# Patient Record
Sex: Female | Born: 1945 | Race: White | Hispanic: No | Marital: Married | Smoking: Former smoker
Health system: Southern US, Community
[De-identification: ages and names within clinical notes are randomized; demographics above are authoritative.]

## PROBLEM LIST (undated history)

## (undated) DIAGNOSIS — R519 Headache, unspecified: Secondary | ICD-10-CM

## (undated) DIAGNOSIS — G4730 Sleep apnea, unspecified: Secondary | ICD-10-CM

## (undated) DIAGNOSIS — N189 Chronic kidney disease, unspecified: Secondary | ICD-10-CM

## (undated) DIAGNOSIS — I10 Essential (primary) hypertension: Secondary | ICD-10-CM

## (undated) DIAGNOSIS — G2571 Drug induced akathisia: Secondary | ICD-10-CM

## (undated) DIAGNOSIS — R0600 Dyspnea, unspecified: Secondary | ICD-10-CM

## (undated) DIAGNOSIS — M1990 Unspecified osteoarthritis, unspecified site: Secondary | ICD-10-CM

## (undated) DIAGNOSIS — G709 Myoneural disorder, unspecified: Secondary | ICD-10-CM

## (undated) DIAGNOSIS — K219 Gastro-esophageal reflux disease without esophagitis: Secondary | ICD-10-CM

## (undated) DIAGNOSIS — F329 Major depressive disorder, single episode, unspecified: Secondary | ICD-10-CM

## (undated) DIAGNOSIS — G243 Spasmodic torticollis: Secondary | ICD-10-CM

## (undated) DIAGNOSIS — K529 Noninfective gastroenteritis and colitis, unspecified: Secondary | ICD-10-CM

## (undated) DIAGNOSIS — F0390 Unspecified dementia without behavioral disturbance: Secondary | ICD-10-CM

## (undated) DIAGNOSIS — F419 Anxiety disorder, unspecified: Secondary | ICD-10-CM

## (undated) DIAGNOSIS — F32A Depression, unspecified: Secondary | ICD-10-CM

## (undated) HISTORY — PX: CHOLECYSTECTOMY: SHX55

## (undated) HISTORY — PX: TONSILLECTOMY: SUR1361

## (undated) HISTORY — PX: KNEE SURGERY: SHX244

## (undated) HISTORY — DX: Essential (primary) hypertension: I10

## (undated) HISTORY — DX: Unspecified osteoarthritis, unspecified site: M19.90

## (undated) HISTORY — DX: Noninfective gastroenteritis and colitis, unspecified: K52.9

## (undated) HISTORY — DX: Spasmodic torticollis: G24.3

## (undated) HISTORY — PX: ABDOMINAL HYSTERECTOMY: SHX81

## (undated) HISTORY — PX: APPENDECTOMY: SHX54

---
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
## 1999-02-06 ENCOUNTER — Other Ambulatory Visit: Admission: RE | Admit: 1999-02-06 | Discharge: 1999-02-06 | Payer: Self-pay | Admitting: *Deleted

## 1999-07-11 ENCOUNTER — Encounter: Payer: Self-pay | Admitting: Gastroenterology

## 1999-07-11 ENCOUNTER — Inpatient Hospital Stay (HOSPITAL_COMMUNITY): Admission: EM | Admit: 1999-07-11 | Discharge: 1999-07-14 | Payer: Self-pay | Admitting: Emergency Medicine

## 1999-07-11 ENCOUNTER — Encounter (INDEPENDENT_AMBULATORY_CARE_PROVIDER_SITE_OTHER): Payer: Self-pay | Admitting: Specialist

## 2000-08-06 ENCOUNTER — Other Ambulatory Visit: Admission: RE | Admit: 2000-08-06 | Discharge: 2000-08-06 | Payer: Self-pay | Admitting: *Deleted

## 2000-08-07 ENCOUNTER — Encounter: Payer: Self-pay | Admitting: *Deleted

## 2000-08-07 ENCOUNTER — Encounter: Admission: RE | Admit: 2000-08-07 | Discharge: 2000-08-07 | Payer: Self-pay | Admitting: *Deleted

## 2000-09-04 ENCOUNTER — Encounter: Admission: RE | Admit: 2000-09-04 | Discharge: 2000-09-04 | Payer: Self-pay | Admitting: Family Medicine

## 2000-09-04 ENCOUNTER — Encounter: Payer: Self-pay | Admitting: Family Medicine

## 2001-02-24 ENCOUNTER — Ambulatory Visit (HOSPITAL_COMMUNITY): Admission: RE | Admit: 2001-02-24 | Discharge: 2001-02-24 | Payer: Self-pay | Admitting: Gastroenterology

## 2001-08-08 ENCOUNTER — Encounter: Payer: Self-pay | Admitting: *Deleted

## 2001-08-08 ENCOUNTER — Encounter: Admission: RE | Admit: 2001-08-08 | Discharge: 2001-08-08 | Payer: Self-pay | Admitting: *Deleted

## 2001-08-12 ENCOUNTER — Other Ambulatory Visit: Admission: RE | Admit: 2001-08-12 | Discharge: 2001-08-12 | Payer: Self-pay | Admitting: *Deleted

## 2001-09-01 ENCOUNTER — Encounter: Admission: RE | Admit: 2001-09-01 | Discharge: 2001-09-01 | Payer: Self-pay | Admitting: Family Medicine

## 2001-09-01 ENCOUNTER — Encounter: Payer: Self-pay | Admitting: Family Medicine

## 2002-06-08 ENCOUNTER — Ambulatory Visit (HOSPITAL_COMMUNITY): Admission: RE | Admit: 2002-06-08 | Discharge: 2002-06-08 | Payer: Self-pay | Admitting: Gastroenterology

## 2002-08-25 ENCOUNTER — Encounter: Payer: Self-pay | Admitting: Family Medicine

## 2002-08-25 ENCOUNTER — Encounter: Admission: RE | Admit: 2002-08-25 | Discharge: 2002-08-25 | Payer: Self-pay | Admitting: Family Medicine

## 2002-08-28 ENCOUNTER — Encounter: Admission: RE | Admit: 2002-08-28 | Discharge: 2002-08-28 | Payer: Self-pay | Admitting: Obstetrics and Gynecology

## 2002-08-28 ENCOUNTER — Encounter: Payer: Self-pay | Admitting: Obstetrics and Gynecology

## 2004-05-09 ENCOUNTER — Ambulatory Visit (HOSPITAL_COMMUNITY): Admission: RE | Admit: 2004-05-09 | Discharge: 2004-05-09 | Payer: Self-pay | Admitting: Orthopedic Surgery

## 2004-05-09 ENCOUNTER — Ambulatory Visit (HOSPITAL_BASED_OUTPATIENT_CLINIC_OR_DEPARTMENT_OTHER): Admission: RE | Admit: 2004-05-09 | Discharge: 2004-05-09 | Payer: Self-pay | Admitting: Orthopedic Surgery

## 2004-10-11 ENCOUNTER — Other Ambulatory Visit: Admission: RE | Admit: 2004-10-11 | Discharge: 2004-10-11 | Payer: Self-pay | Admitting: Obstetrics and Gynecology

## 2004-10-18 ENCOUNTER — Encounter: Admission: RE | Admit: 2004-10-18 | Discharge: 2004-10-18 | Payer: Self-pay | Admitting: Obstetrics and Gynecology

## 2006-02-01 ENCOUNTER — Encounter: Admission: RE | Admit: 2006-02-01 | Discharge: 2006-02-01 | Payer: Self-pay | Admitting: Cardiology

## 2006-05-16 ENCOUNTER — Encounter: Admission: RE | Admit: 2006-05-16 | Discharge: 2006-05-16 | Payer: Self-pay | Admitting: Family Medicine

## 2006-12-30 ENCOUNTER — Encounter: Admission: RE | Admit: 2006-12-30 | Discharge: 2006-12-30 | Payer: Self-pay | Admitting: Family Medicine

## 2007-01-03 ENCOUNTER — Encounter: Admission: RE | Admit: 2007-01-03 | Discharge: 2007-01-03 | Payer: Self-pay | Admitting: Family Medicine

## 2007-09-08 ENCOUNTER — Encounter
Admission: RE | Admit: 2007-09-08 | Discharge: 2007-12-07 | Payer: Self-pay | Admitting: Physical Medicine & Rehabilitation

## 2007-09-09 ENCOUNTER — Ambulatory Visit: Payer: Self-pay | Admitting: Physical Medicine & Rehabilitation

## 2007-09-17 ENCOUNTER — Encounter
Admission: RE | Admit: 2007-09-17 | Discharge: 2007-10-17 | Payer: Self-pay | Admitting: Physical Medicine & Rehabilitation

## 2007-10-13 ENCOUNTER — Ambulatory Visit: Payer: Self-pay | Admitting: Physical Medicine & Rehabilitation

## 2007-11-10 ENCOUNTER — Ambulatory Visit: Payer: Self-pay | Admitting: Physical Medicine & Rehabilitation

## 2007-12-23 ENCOUNTER — Ambulatory Visit: Payer: Self-pay | Admitting: Physical Medicine & Rehabilitation

## 2007-12-23 ENCOUNTER — Encounter
Admission: RE | Admit: 2007-12-23 | Discharge: 2008-03-22 | Payer: Self-pay | Admitting: Physical Medicine & Rehabilitation

## 2008-03-01 ENCOUNTER — Encounter
Admission: RE | Admit: 2008-03-01 | Discharge: 2008-05-30 | Payer: Self-pay | Admitting: Physical Medicine & Rehabilitation

## 2008-03-01 ENCOUNTER — Ambulatory Visit: Payer: Self-pay | Admitting: Physical Medicine & Rehabilitation

## 2008-03-25 ENCOUNTER — Ambulatory Visit: Payer: Self-pay | Admitting: Physical Medicine & Rehabilitation

## 2008-03-30 ENCOUNTER — Ambulatory Visit: Payer: Self-pay | Admitting: Physical Medicine & Rehabilitation

## 2008-04-27 ENCOUNTER — Ambulatory Visit: Payer: Self-pay | Admitting: Physical Medicine & Rehabilitation

## 2008-05-27 ENCOUNTER — Encounter
Admission: RE | Admit: 2008-05-27 | Discharge: 2008-08-25 | Payer: Self-pay | Admitting: Physical Medicine & Rehabilitation

## 2008-05-28 ENCOUNTER — Ambulatory Visit: Payer: Self-pay | Admitting: Physical Medicine & Rehabilitation

## 2008-06-25 ENCOUNTER — Ambulatory Visit: Payer: Self-pay | Admitting: Physical Medicine & Rehabilitation

## 2008-08-10 ENCOUNTER — Ambulatory Visit: Payer: Self-pay | Admitting: Physical Medicine & Rehabilitation

## 2008-09-06 ENCOUNTER — Encounter
Admission: RE | Admit: 2008-09-06 | Discharge: 2008-12-05 | Payer: Self-pay | Admitting: Physical Medicine & Rehabilitation

## 2008-09-07 ENCOUNTER — Ambulatory Visit: Payer: Self-pay | Admitting: Physical Medicine & Rehabilitation

## 2008-11-02 ENCOUNTER — Ambulatory Visit: Payer: Self-pay | Admitting: Physical Medicine & Rehabilitation

## 2008-11-09 ENCOUNTER — Ambulatory Visit: Payer: Self-pay | Admitting: Physical Medicine & Rehabilitation

## 2008-12-03 ENCOUNTER — Encounter: Admission: RE | Admit: 2008-12-03 | Discharge: 2008-12-03 | Payer: Self-pay | Admitting: Family Medicine

## 2008-12-07 ENCOUNTER — Encounter
Admission: RE | Admit: 2008-12-07 | Discharge: 2009-03-07 | Payer: Self-pay | Admitting: Physical Medicine & Rehabilitation

## 2008-12-07 ENCOUNTER — Ambulatory Visit: Payer: Self-pay | Admitting: Physical Medicine & Rehabilitation

## 2008-12-30 ENCOUNTER — Ambulatory Visit: Payer: Self-pay | Admitting: *Deleted

## 2009-02-07 ENCOUNTER — Ambulatory Visit: Payer: Self-pay | Admitting: Physical Medicine & Rehabilitation

## 2009-04-27 ENCOUNTER — Encounter
Admission: RE | Admit: 2009-04-27 | Discharge: 2009-05-02 | Payer: Self-pay | Admitting: Physical Medicine & Rehabilitation

## 2009-05-02 ENCOUNTER — Ambulatory Visit: Payer: Self-pay | Admitting: Physical Medicine & Rehabilitation

## 2009-06-30 ENCOUNTER — Ambulatory Visit: Payer: Self-pay | Admitting: *Deleted

## 2009-07-29 ENCOUNTER — Encounter
Admission: RE | Admit: 2009-07-29 | Discharge: 2009-08-04 | Payer: Self-pay | Admitting: Physical Medicine & Rehabilitation

## 2009-08-04 ENCOUNTER — Ambulatory Visit: Payer: Self-pay | Admitting: Physical Medicine & Rehabilitation

## 2009-11-03 ENCOUNTER — Encounter
Admission: RE | Admit: 2009-11-03 | Discharge: 2009-12-14 | Payer: Self-pay | Admitting: Physical Medicine & Rehabilitation

## 2009-11-04 ENCOUNTER — Ambulatory Visit: Payer: Self-pay | Admitting: Physical Medicine & Rehabilitation

## 2010-01-03 ENCOUNTER — Encounter: Admission: RE | Admit: 2010-01-03 | Discharge: 2010-01-03 | Payer: Self-pay | Admitting: Family Medicine

## 2010-01-19 ENCOUNTER — Encounter
Admission: RE | Admit: 2010-01-19 | Discharge: 2010-01-26 | Payer: Self-pay | Admitting: Physical Medicine & Rehabilitation

## 2010-01-26 ENCOUNTER — Ambulatory Visit: Payer: Self-pay | Admitting: Physical Medicine & Rehabilitation

## 2010-04-19 ENCOUNTER — Encounter
Admission: RE | Admit: 2010-04-19 | Discharge: 2010-04-21 | Payer: Self-pay | Admitting: Physical Medicine & Rehabilitation

## 2010-04-21 ENCOUNTER — Ambulatory Visit: Payer: Self-pay | Admitting: Physical Medicine & Rehabilitation

## 2010-07-18 ENCOUNTER — Encounter
Admission: RE | Admit: 2010-07-18 | Discharge: 2010-07-21 | Payer: Self-pay | Admitting: Physical Medicine & Rehabilitation

## 2010-07-21 ENCOUNTER — Ambulatory Visit: Payer: Self-pay | Admitting: Physical Medicine & Rehabilitation

## 2010-08-04 ENCOUNTER — Ambulatory Visit: Payer: Self-pay | Admitting: Vascular Surgery

## 2010-09-13 ENCOUNTER — Encounter (HOSPITAL_COMMUNITY)
Admission: RE | Admit: 2010-09-13 | Discharge: 2010-09-22 | Payer: Self-pay | Source: Home / Self Care | Admitting: Cardiology

## 2010-09-20 ENCOUNTER — Encounter: Admission: RE | Admit: 2010-09-20 | Discharge: 2010-09-20 | Payer: Self-pay | Admitting: Neurology

## 2010-09-30 ENCOUNTER — Inpatient Hospital Stay (HOSPITAL_COMMUNITY)
Admission: EM | Admit: 2010-09-30 | Discharge: 2010-10-06 | Payer: Self-pay | Source: Home / Self Care | Admitting: Emergency Medicine

## 2010-09-30 ENCOUNTER — Ambulatory Visit: Payer: Self-pay | Admitting: Pulmonary Disease

## 2010-10-23 ENCOUNTER — Encounter
Admission: RE | Admit: 2010-10-23 | Discharge: 2010-10-27 | Payer: Self-pay | Source: Home / Self Care | Attending: Physical Medicine & Rehabilitation | Admitting: Physical Medicine & Rehabilitation

## 2010-10-27 ENCOUNTER — Ambulatory Visit: Payer: Self-pay | Admitting: Physical Medicine & Rehabilitation

## 2010-11-15 ENCOUNTER — Encounter: Admission: RE | Admit: 2010-11-15 | Discharge: 2010-11-15 | Payer: Self-pay | Admitting: Specialist

## 2010-12-17 HISTORY — PX: JOINT REPLACEMENT: SHX530

## 2011-01-06 ENCOUNTER — Encounter: Payer: Self-pay | Admitting: Neurology

## 2011-01-08 NOTE — H&P (Signed)
[name], [name]            ACCOUNT NO.:  [id number]  MEDICAL RECORD NO.:  [id number]          PATIENT TYPE:  INP  LOCATION:  NA                           FACILITY:  [facility]  PHYSICIAN:  Madlyn Frankel. Charlann Boxer, M.D.  DATE OF BIRTH:  09/26/1946  DATE OF ADMISSION: DATE OF DISCHARGE:                             HISTORY & PHYSICAL   HISTORY:  This is a 65 year old female who is seen at request Dr. Thomasena Edis' acute care clinic for acute onset of right knee pain.  She states the pain was quite severe limiting her activity.  She is also having locking up sensation and after injection with Dr. Thomasena Edis, it did not provide any relief, it was determined radiographically and through exam by Dr. Charlann Boxer to go and schedule the patient for a knee arthroplasty.  PAST MEDICAL HISTORY:  Significant for some migraines prior to 2005, anxiety, depression, elevated cholesterol, sleep apnea, hypertension, hiatal hernia reflux disease, gallbladder disease, some insomnia, and balance problems.  She has cervical dystonia, shortness of breath on exertion, joint pain and spasm.  CURRENT MEDICATION: 1. Osteo Bi-Flex 2 a day. 2. Lipitor 20 mg a day. 3. Azor 5/20 once a day. 4. Clonazepam 2 mg a day. 5. Hydrocodone through pain clinic 5/500 two times a day.  ALLERGIES:  She has medicine allergies to SULFA drugs.  SOCIAL HISTORY:  She is married.  She has a past history of tobacco.  No history of alcohol or street drugs.  She has 3 children, 2 biological, 1 adopted.  FAMILY HISTORY:  Her father is deceased of MI.  Mother is 76 with Alzheimer's.  She has 2 siblings.  REVIEW OF SYSTEMS:  Notable for those difficulties described in the history of present illness, past medical history.  Her review of systems sheet is otherwise unremarkable.  PHYSICAL EXAMINATION:  VITAL SIGNS:  The patient is 5 feet 4 inches, weight 167 pounds.  Blood pressure is 120/90, respirations are 20, pulse is 80.  GENERAL:  General  health is good.  She has history of insomnia. HEENT:  Shows her to be normocephalic.  She wears prescription glasses. NECK:  Soft without any bruits.  She has cervical dystonia. CHEST:  Clear to auscultation bilaterally.  She has sleep apnea and shortness of breath on exertion. HEART:  S1, S2.  There are no murmurs, rubs or gallops.  She has history of hypercholesterolemia and hypertension. ABDOMEN:  Soft, nondistended with a history of gallbladder, hiatal hernia, and GERD. GI/GU:  Otherwise unremarkable. EXTREMITIES:  Joint pain, osteoarthritis. DERMATOLOGIC:  Dermatologically she is intact. NEUROLOGIC:  Neurologically she has history of anxiety, depression.  LABORATORY DATA:  Her labs, EKG and chest x-ray are pending through [facility].  IMPRESSION:  Right knee osteoarthritis.  PLAN:  She will be admitted on January 15, 2011, for total right knee arthroplasty with Dr. Charlann Boxer.  Her discharge medications including Xarelto, Robaxin, iron, MiraLax and Colace were given to her today.  Her pain medicines will be given to her on discharge.    Russell L. Webb Silversmith, RN   ______________________________ Madlyn Frankel Charlann Boxer, M.D.    RLW/MEDQ  D:  01/04/2011  T:  01/04/2011  Job:  [id number]  Electronically Signed by Lauree Chandler NP-C on 01/05/2011 09:47:41 AM Electronically Signed by Durene Romans M.D. on 01/08/2011 09:36:07 AM

## 2011-01-09 LAB — URINALYSIS, ROUTINE W REFLEX MICROSCOPIC
Ketones, ur: NEGATIVE mg/dL
Protein, ur: NEGATIVE mg/dL
Urine Glucose, Fasting: NEGATIVE mg/dL
Urobilinogen, UA: 0.2 mg/dL (ref 0.0–1.0)

## 2011-01-09 LAB — SURGICAL PCR SCREEN
MRSA, PCR: NEGATIVE
Staphylococcus aureus: NEGATIVE

## 2011-01-09 LAB — CBC
MCH: 28.2 pg (ref 26.0–34.0)
MCV: 85.2 fL (ref 78.0–100.0)
Platelets: 368 10*3/uL (ref 150–400)
RDW: 13.4 % (ref 11.5–15.5)

## 2011-01-09 LAB — DIFFERENTIAL
Eosinophils Absolute: 0.4 10*3/uL (ref 0.0–0.7)
Eosinophils Relative: 4 % (ref 0–5)
Lymphs Abs: 3.9 10*3/uL (ref 0.7–4.0)
Monocytes Relative: 10 % (ref 3–12)

## 2011-01-09 LAB — BASIC METABOLIC PANEL
BUN: 12 mg/dL (ref 6–23)
Chloride: 107 mEq/L (ref 96–112)
Potassium: 4.7 mEq/L (ref 3.5–5.1)
Sodium: 144 mEq/L (ref 135–145)

## 2011-01-15 ENCOUNTER — Inpatient Hospital Stay (HOSPITAL_COMMUNITY)
Admission: RE | Admit: 2011-01-15 | Discharge: 2011-01-17 | DRG: 470 | Disposition: A | Discharge status: Home Health | Payer: 59 | Attending: Orthopedic Surgery | Admitting: Orthopedic Surgery

## 2011-01-15 DIAGNOSIS — E669 Obesity, unspecified: Secondary | ICD-10-CM | POA: Diagnosis present

## 2011-01-15 DIAGNOSIS — Z9119 Patient's noncompliance with other medical treatment and regimen: Secondary | ICD-10-CM

## 2011-01-15 DIAGNOSIS — G4733 Obstructive sleep apnea (adult) (pediatric): Secondary | ICD-10-CM | POA: Diagnosis present

## 2011-01-15 DIAGNOSIS — K219 Gastro-esophageal reflux disease without esophagitis: Secondary | ICD-10-CM | POA: Diagnosis present

## 2011-01-15 DIAGNOSIS — Z91199 Patient's noncompliance with other medical treatment and regimen due to unspecified reason: Secondary | ICD-10-CM

## 2011-01-15 DIAGNOSIS — M1710 Unilateral primary osteoarthritis, unspecified knee: Principal | ICD-10-CM | POA: Diagnosis present

## 2011-01-15 DIAGNOSIS — I10 Essential (primary) hypertension: Secondary | ICD-10-CM | POA: Diagnosis present

## 2011-01-15 LAB — TYPE AND SCREEN
ABO/RH(D): O POS
Antibody Screen: NEGATIVE

## 2011-01-16 LAB — BASIC METABOLIC PANEL
BUN: 13 mg/dL (ref 6–23)
Calcium: 8.8 mg/dL (ref 8.4–10.5)
Creatinine, Ser: 1.07 mg/dL (ref 0.4–1.2)
GFR calc Af Amer: >60 mL/min (ref >60)
GFR calc non Af Amer: 52 mL/min — ABNORMAL LOW (ref >60)

## 2011-01-16 LAB — CBC
MCH: 28.8 pg (ref 26.0–34.0)
MCHC: 33.7 g/dL (ref 30.0–36.0)
Platelets: 229 10*3/uL (ref 150–400)
RBC: 3.89 MIL/uL (ref 3.87–5.11)
RDW: 13.5 % (ref 11.5–15.5)

## 2011-01-17 LAB — CBC
Platelets: 204 10*3/uL (ref 150–400)
RBC: 3.54 MIL/uL — ABNORMAL LOW (ref 3.87–5.11)
RDW: 13.2 % (ref 11.5–15.5)
WBC: 7.2 10*3/uL (ref 4.0–10.5)

## 2011-01-17 LAB — BASIC METABOLIC PANEL
Chloride: 106 mEq/L (ref 96–112)
GFR calc Af Amer: 60 mL/min — ABNORMAL LOW (ref >60)
GFR calc non Af Amer: 49 mL/min — ABNORMAL LOW (ref >60)
Potassium: 4.4 mEq/L (ref 3.5–5.1)

## 2011-01-18 NOTE — Op Note (Signed)
[name], [name]            ACCOUNT NO.:  [id number]  MEDICAL RECORD NO.:  [id number]          PATIENT TYPE:  INP  LOCATION:  0011                         FACILITY:  [facility]  PHYSICIAN:  Madlyn Frankel. Charlann Boxer, M.D.  DATE OF BIRTH:  [birth year]  DATE OF PROCEDURE:  01/15/2011 DATE OF DISCHARGE:                              OPERATIVE REPORT   PREOPERATIVE DIAGNOSIS:  Right knee osteoarthritis.  POSTOPERATIVE DIAGNOSIS:  Right knee osteoarthritis.  PROCEDURE:  Right total knee replacement utilizing DePuy component size 2.5 femur, 2.5 tibia, 12.5 rotating platform posterior stabilized insert and 38 patellar button.  SURGEON:  Madlyn Frankel. Charlann Boxer, M.D.  ASSISTANT:  Nelia Shi. Webb Silversmith, RN  ANESTHESIA:  Spinal.  SPECIMENS:  None.  COMPLICATIONS:  None.  DRAINS:  One Hemovac.  TOURNIQUET TIME:  34 minutes, 250 mmHg.  INDICATION FOR THE PROCEDURE:  [name] is a 65-old-female, who presented for evaluation of right knee pain.  She had mechanical type symptoms with radiographic evidence supporting advanced patellofemoral arthritis.  She had presented to the office with previous MRI evaluation, which supported this.  Her radiographs were indicative of her current situation.  She had failed conservative measures attempts at some strengthening and injections and with this wished to proceed more definitive measures.  Risks of infection, DVT, component failure, need for revision surgery all were discussed and reviewed.  Postoperative course and expectations were reviewed.  Consent was obtained for benefit of pain relief.  PROCEDURE IN DETAIL:  Patient was brought to the operative theater. Once adequate anesthesia, preoperative antibiotics, Ancef administered, patient was positioned supine with the right thigh tourniquet placed. The right lower extremity was then prepped and draped in a sterile fashion with the right leg placed in the Mayo leg holder.  A time-out was performed  identifying the patient, planned procedure and the extremity.  Leg was exsanguinated and tourniquet elevated to 250 mmHg.  A midline incision was made followed by median parapatellar arthrotomy. Following initial exposure and debridement, attention was directed to patella.  Precut measurement was lowered by 18 mm, I resected down to about 13 mm and used a 38 patellar button to restore patellar height and protect and cover the cut surface.  Lug holes were drilled and a metal shim was placed to protect the patella from retractors and saw blades.  At this point, attention was directed to the femur.  Femoral canal was opened with a drill, irrigated throughout from the fat emboli.  An intramedullary rod was passed in 30 degrees of valgus, 9 mm of the bone was resected off the distal femur based on hyperextension and preoperative exam.  At this point, the tibia was subluxated anteriorly.  The extramedullary guide was positioned and 10 mm of bone was resected off the proximal lateral tibia.  Following resection of the meniscus and cruciate stumps and removal of the bone, I checked to confirm the cup was perpendicular in the coronal plane in addition to making sure the extension gap was adequate with a 10-mm block.  Once this was confirmed, we sized the femur to be a size 2.5.  The rotation block was pinned into position anterior  reference based off the proximal tibial cut.  Four in one cutting block with a 2.5 tibia was placed.  The four anterior and posterior chamfer cuts were then made without difficulty or complication.  There was slight notch anterior and this was beveled out.  At this point, the final box cut was made off the lateral aspect of distal femur.  The tibia was then subluxed anteriorly, cut surface was best fit with size 2.5 tibial tray, which was pinned into position through the medial third tubercle, drilled and keel punched.  Trial reduction was carried out to 2.5 femur,  2.5 tibia and the 10 mm insert. Initially, there was about a few degrees of hyperextension to side one with 12.5 insert.  At this point, the trial components were removed.  Sclerotic bone was drilled with a smooth pin.  The knee was irrigated with normal saline solution and synovial capsule junction injected with 0.25% Marcaine with epinephrine and morphine, total of 51 mL.  Final components were opened and cement mixed.  Final components were then cemented onto clean and dried cut surface of bone with a 12.5 insert placed.  The knee was brought to extension to allow for cement to cure.  Extruded cement was removed.  Following this resection and the curing of the cement, excessive cement was removed throughout and the final 12.5 insert was opened and chosen. The insert was placed.  The knee was re-irrigated with normal saline solution.  Tourniquet was let down after 34 minutes without significant hemostasis.  A medium Hemovac drain was placed deep.  The extensor mechanism was then reapproximated at 90 agrees of flexion. The remaining of the wound was closed with 2-0 Vicryl and running 4-0 Monocryl.  The knee was cleaned and dried sterilely with Dermabond and Aquacel dressing.  The drain site was dressed separately.  She was then brought to the recovery room in stable condition tolerating the procedure well.     Madlyn Frankel Charlann Boxer, M.D.     MDO/MEDQ  D:  01/15/2011  T:  01/15/2011  Job:  [id number]  Electronically Signed by Durene Romans M.D. on 01/18/2011 11:51:24 AM

## 2011-01-28 NOTE — Discharge Summary (Signed)
  [name], [name]            ACCOUNT NO.:  [id number]  MEDICAL RECORD NO.:  [id number]          PATIENT TYPE:  INP  LOCATION:  1617                         FACILITY:  [facility]  PHYSICIAN:  Madlyn Frankel. Charlann Boxer, M.D.  DATE OF BIRTH:  1946/08/19  DATE OF ADMISSION:  01/15/2011 DATE OF DISCHARGE:  01/17/2011                              DISCHARGE SUMMARY   BRIEF HISTORY:  This is a 65 year old female seen [facility] per Dr[facility]dis[facility] for acute onset of right knee pain. This pain was quite severe and limiting her activity.  She was also having locking up and weakness.  After failed injections, she decided to proceed with arthroplasty after examined by Dr. Charlann Boxer.  ADMITTING DIAGNOSIS:  Right knee osteoarthritis.  HOSPITAL COURSE:  The patient was admitted through [facility] on January 30th for right knee arthroplasty with Dr. Charlann Boxer.  She was taken to the operating theater and underwent the arthroplasty without any difficulty.  She was taken to PACU, Recovery and brought to [facility] for further recovery and rehabilitation.  Since that time, she has advanced her diet to regular, has been up with physical therapy and done well with that.  Her labs are stable.  Her hemoglobin and hematocrit are 9.7 and 30.5.  Sodium is 141, potassium is 4.4, BUN and creatinine are 12.1 and 1.1, and her blood sugar is 128.  She is afebrile.  Her vital signs are stable.  Discharge condition is good.  Her disposition plan is for home with home health physical therapy.  She will follow up with Dr. Charlann Boxer in 2 weeks.  She knows to keep the wound clean and dry.  Discharge instructions were given, and she stated understanding.  DISCHARGE MEDICATIONS:  As follows: 1. Acetaminophen 325 mg 2 every 6 hours as needed. 2. Colace 100 mg twice daily. 3. Ferrous sulfate 325 mg 3 times a day. 4. Hydrocodone 7.5/325 one to two q.4-6 h. p.r.n. pain. 5. Robaxin 500 mg every 6 hours as  needed. 6. MiraLax 17 g a day as needed. 7. Xarelto 10 mg a day for 10 days. 8. Azor 5/20 every morning. 9. Temazepam 2 mg twice daily. 10.Ibuprofen as needed. 11.Lipitor 20 mg daily. 12.Osteo Bi-Flex daily. 13.She will stop her regular Vicodin protocol.     Russell L. Webb Silversmith, RN   ______________________________ Madlyn Frankel Charlann Boxer, M.D.    RLW/MEDQ  D:  01/17/2011  T:  01/17/2011  Job:  [id number]  Electronically Signed by Lauree Chandler NP-C on 01/24/2011 09:43:46 AM Electronically Signed by Durene Romans M.D. on 01/28/2011 09:14:10 AM

## 2011-01-29 ENCOUNTER — Ambulatory Visit: Payer: 59 | Admitting: Physical Medicine & Rehabilitation

## 2011-02-08 ENCOUNTER — Ambulatory Visit (HOSPITAL_BASED_OUTPATIENT_CLINIC_OR_DEPARTMENT_OTHER): Payer: 59 | Admitting: Physical Medicine & Rehabilitation

## 2011-02-08 ENCOUNTER — Encounter: Payer: 59 | Attending: Physical Medicine & Rehabilitation

## 2011-02-08 DIAGNOSIS — G243 Spasmodic torticollis: Secondary | ICD-10-CM | POA: Insufficient documentation

## 2011-02-08 DIAGNOSIS — G245 Blepharospasm: Secondary | ICD-10-CM | POA: Insufficient documentation

## 2011-02-13 ENCOUNTER — Ambulatory Visit: Payer: 59 | Admitting: Physical Medicine & Rehabilitation

## 2011-02-28 LAB — COMPREHENSIVE METABOLIC PANEL WITH GFR
ALT: 26 U/L (ref 0–35)
AST: 55 U/L — ABNORMAL HIGH (ref 0–37)
Albumin: 2 g/dL — ABNORMAL LOW (ref 3.5–5.2)
Alkaline Phosphatase: 121 U/L — ABNORMAL HIGH (ref 39–117)
BUN: 29 mg/dL — ABNORMAL HIGH (ref 6–23)
CO2: 21 meq/L (ref 19–32)
Calcium: 7.5 mg/dL — ABNORMAL LOW (ref 8.4–10.5)
Chloride: 99 meq/L (ref 96–112)
Creatinine, Ser: 3.42 mg/dL — ABNORMAL HIGH (ref 0.4–1.2)
GFR calc non Af Amer: 14 mL/min — ABNORMAL LOW
Glucose, Bld: 143 mg/dL — ABNORMAL HIGH (ref 70–99)
Potassium: 2.4 meq/L — CL (ref 3.5–5.1)
Sodium: 136 meq/L (ref 135–145)
Total Bilirubin: 0.4 mg/dL (ref 0.3–1.2)
Total Protein: 5.8 g/dL — ABNORMAL LOW (ref 6.0–8.3)

## 2011-02-28 LAB — BASIC METABOLIC PANEL WITH GFR
BUN: 27 mg/dL — ABNORMAL HIGH (ref 6–23)
CO2: 20 meq/L (ref 19–32)
Calcium: 6.3 mg/dL — CL (ref 8.4–10.5)
Chloride: 107 meq/L (ref 96–112)
Creatinine, Ser: 2.6 mg/dL — ABNORMAL HIGH (ref 0.4–1.2)
GFR calc non Af Amer: 19 mL/min — ABNORMAL LOW
Glucose, Bld: 117 mg/dL — ABNORMAL HIGH (ref 70–99)
Potassium: 2.8 meq/L — ABNORMAL LOW (ref 3.5–5.1)
Sodium: 136 meq/L (ref 135–145)

## 2011-02-28 LAB — BASIC METABOLIC PANEL
BUN: 25 mg/dL — ABNORMAL HIGH (ref 6–23)
BUN: 28 mg/dL — ABNORMAL HIGH (ref 6–23)
CO2: 20 mEq/L (ref 19–32)
CO2: 27 mEq/L (ref 19–32)
Calcium: 7.7 mg/dL — ABNORMAL LOW (ref 8.4–10.5)
Chloride: 117 mEq/L — ABNORMAL HIGH (ref 96–112)
Chloride: 119 mEq/L — ABNORMAL HIGH (ref 96–112)
Creatinine, Ser: 1.7 mg/dL — ABNORMAL HIGH (ref 0.4–1.2)
Creatinine, Ser: 2 mg/dL — ABNORMAL HIGH (ref 0.4–1.2)
Creatinine, Ser: 2.18 mg/dL — ABNORMAL HIGH (ref 0.4–1.2)
Glucose, Bld: 110 mg/dL — ABNORMAL HIGH (ref 70–99)
Glucose, Bld: 151 mg/dL — ABNORMAL HIGH (ref 70–99)
Glucose, Bld: 92 mg/dL (ref 70–99)
Potassium: 3.7 mEq/L (ref 3.5–5.1)

## 2011-02-28 LAB — UIFE/LIGHT CHAINS/TP QN, 24-HR UR
Albumin, U: DETECTED
Beta, Urine: DETECTED — AB
Free Lambda Excretion/Day: 202.68 mg/d
Free Lambda Lt Chains,Ur: 12.1 mg/dL — ABNORMAL HIGH (ref 0.08–1.01)
Free Lt Chn Excr Rate: 700.15 mg/d
Gamma Globulin, Urine: DETECTED — AB
Time: 24 hours
Total Protein, Urine-Ur/day: 1154 mg/d — ABNORMAL HIGH (ref 10–140)
Total Protein, Urine: 68.9 mg/dL

## 2011-02-28 LAB — IGG, IGA, IGM
IgA: 128 mg/dL (ref 68–378)
IgG (Immunoglobin G), Serum: 681 mg/dL — ABNORMAL LOW (ref 694–1618)
IgM, Serum: 26 mg/dL — ABNORMAL LOW (ref 60–263)

## 2011-02-28 LAB — CBC
HCT: 31.1 % — ABNORMAL LOW (ref 36.0–46.0)
HCT: 31.5 % — ABNORMAL LOW (ref 36.0–46.0)
Hemoglobin: 10.5 g/dL — ABNORMAL LOW (ref 12.0–15.0)
Hemoglobin: 9.1 g/dL — ABNORMAL LOW (ref 12.0–15.0)
MCH: 26.4 pg (ref 26.0–34.0)
MCH: 26.8 pg (ref 26.0–34.0)
MCH: 27 pg (ref 26.0–34.0)
MCH: 27.1 pg (ref 26.0–34.0)
MCHC: 31.7 g/dL (ref 30.0–36.0)
MCHC: 32.4 g/dL (ref 30.0–36.0)
MCV: 81.4 fL (ref 78.0–100.0)
MCV: 81.7 fL (ref 78.0–100.0)
MCV: 82.1 fL (ref 78.0–100.0)
MCV: 83.8 fL (ref 78.0–100.0)
MCV: 85.2 fL (ref 78.0–100.0)
MCV: 86.3 fL (ref 78.0–100.0)
Platelets: 509 10*3/uL — ABNORMAL HIGH (ref 150–400)
Platelets: 512 10*3/uL — ABNORMAL HIGH (ref 150–400)
Platelets: 556 10*3/uL — ABNORMAL HIGH (ref 150–400)
Platelets: 563 10*3/uL — ABNORMAL HIGH (ref 150–400)
Platelets: 590 10*3/uL — ABNORMAL HIGH (ref 150–400)
RBC: 3.4 MIL/uL — ABNORMAL LOW (ref 3.87–5.11)
RBC: 3.44 MIL/uL — ABNORMAL LOW (ref 3.87–5.11)
RBC: 3.82 MIL/uL — ABNORMAL LOW (ref 3.87–5.11)
RBC: 3.87 MIL/uL (ref 3.87–5.11)
RDW: 16.1 % — ABNORMAL HIGH (ref 11.5–15.5)
RDW: 16.3 % — ABNORMAL HIGH (ref 11.5–15.5)
RDW: 17.1 % — ABNORMAL HIGH (ref 11.5–15.5)
RDW: 17.6 % — ABNORMAL HIGH (ref 11.5–15.5)
WBC: 10.9 10*3/uL — ABNORMAL HIGH (ref 4.0–10.5)
WBC: 28.6 10*3/uL — ABNORMAL HIGH (ref 4.0–10.5)
WBC: 33.9 10*3/uL — ABNORMAL HIGH (ref 4.0–10.5)
WBC: 35.5 10*3/uL — ABNORMAL HIGH (ref 4.0–10.5)

## 2011-02-28 LAB — TYPE AND SCREEN

## 2011-02-28 LAB — BLOOD GAS, ARTERIAL
Acid-base deficit: 8.5 mmol/L — ABNORMAL HIGH (ref 0.0–2.0)
Drawn by: 229971
FIO2: 1 %
TCO2: 17.2 mmol/L (ref 0–100)
pCO2 arterial: 33.9 mmHg — ABNORMAL LOW (ref 35.0–45.0)
pO2, Arterial: 217 mmHg — ABNORMAL HIGH (ref 80.0–100.0)

## 2011-02-28 LAB — URINE CULTURE
Colony Count: >=100000
Culture  Setup Time: 201110151918

## 2011-02-28 LAB — HEMOCCULT GUIAC POC 1CARD (OFFICE): Fecal Occult Bld: POSITIVE

## 2011-02-28 LAB — COMPREHENSIVE METABOLIC PANEL
AST: 24 U/L (ref 0–37)
AST: 52 U/L — ABNORMAL HIGH (ref 0–37)
Albumin: 2.1 g/dL — ABNORMAL LOW (ref 3.5–5.2)
Alkaline Phosphatase: 77 U/L (ref 39–117)
Alkaline Phosphatase: 95 U/L (ref 39–117)
BUN: 11 mg/dL (ref 6–23)
CO2: 18 mEq/L — ABNORMAL LOW (ref 19–32)
CO2: 25 mEq/L (ref 19–32)
Chloride: 111 mEq/L (ref 96–112)
Chloride: 113 mEq/L — ABNORMAL HIGH (ref 96–112)
Creatinine, Ser: 2.54 mg/dL — ABNORMAL HIGH (ref 0.4–1.2)
GFR calc Af Amer: 23 mL/min — ABNORMAL LOW (ref >60)
GFR calc Af Amer: 35 mL/min — ABNORMAL LOW (ref >60)
GFR calc non Af Amer: 19 mL/min — ABNORMAL LOW (ref >60)
Potassium: 3.3 mEq/L — ABNORMAL LOW (ref 3.5–5.1)
Potassium: 3.4 mEq/L — ABNORMAL LOW (ref 3.5–5.1)
Total Bilirubin: 0.4 mg/dL (ref 0.3–1.2)
Total Bilirubin: 0.4 mg/dL (ref 0.3–1.2)

## 2011-02-28 LAB — IMMUNOFIXATION ADD-ON

## 2011-02-28 LAB — RHEUMATOID FACTOR: Rhuematoid fact SerPl-aCnc: <20 IU/mL (ref 0–20)

## 2011-02-28 LAB — CULTURE, BLOOD (ROUTINE X 2)
Culture  Setup Time: 201110152100
Culture  Setup Time: 201110152100

## 2011-02-28 LAB — URINALYSIS, ROUTINE W REFLEX MICROSCOPIC
Bilirubin Urine: NEGATIVE
Glucose, UA: NEGATIVE mg/dL
Ketones, ur: NEGATIVE mg/dL
Protein, ur: 100 mg/dL — AB
pH: 6 (ref 5.0–8.0)

## 2011-02-28 LAB — IGG: IgG (Immunoglobin G), Serum: 653 mg/dL — ABNORMAL LOW (ref 694–1618)

## 2011-02-28 LAB — MPO/PR-3 (ANCA) ANTIBODIES

## 2011-02-28 LAB — POCT I-STAT 3, ART BLOOD GAS (G3+)
Acid-base deficit: 4 mmol/L — ABNORMAL HIGH (ref 0.0–2.0)
Acid-base deficit: 9 mmol/L — ABNORMAL HIGH (ref 0.0–2.0)
Bicarbonate: 16.3 mEq/L — ABNORMAL LOW (ref 20.0–24.0)
O2 Saturation: 91 %
Patient temperature: 97.8
pH, Arterial: 7.42 — ABNORMAL HIGH (ref 7.350–7.400)
pO2, Arterial: 63 mmHg — ABNORMAL LOW (ref 80.0–100.0)
pO2, Arterial: 72 mmHg — ABNORMAL LOW (ref 80.0–100.0)

## 2011-02-28 LAB — LACTATE DEHYDROGENASE: LDH: 182 U/L (ref 94–250)

## 2011-02-28 LAB — DIFFERENTIAL
Basophils Relative: 0 % (ref 0–1)
Eosinophils Absolute: 0 10*3/uL (ref 0.0–0.7)
Eosinophils Relative: 0 % (ref 0–5)
Lymphs Abs: 1.1 10*3/uL (ref 0.7–4.0)
Monocytes Absolute: 1.1 10*3/uL — ABNORMAL HIGH (ref 0.1–1.0)
Neutro Abs: 33.3 10*3/uL — ABNORMAL HIGH (ref 1.7–7.7)
Neutrophils Relative %: 94 % — ABNORMAL HIGH (ref 43–77)

## 2011-02-28 LAB — CLOSTRIDIUM DIFFICILE EIA

## 2011-02-28 LAB — ABO/RH: ABO/RH(D): O POS

## 2011-02-28 LAB — CULTURE, BAL-QUANTITATIVE W GRAM STAIN: Colony Count: >=100000

## 2011-02-28 LAB — CARDIAC PANEL(CRET KIN+CKTOT+MB+TROPI)
Relative Index: 0.2 (ref 0.0–2.5)
Troponin I: 0.03 ng/mL (ref 0.00–0.06)

## 2011-02-28 LAB — PROTEIN ELECTROPH W RFLX QUANT IMMUNOGLOBULINS
Alpha-1-Globulin: 16.7 % — ABNORMAL HIGH (ref 2.9–4.9)
Beta 2: 4.3 % (ref 3.2–6.5)
Gamma Globulin: 13 % (ref 11.1–18.8)
M-Spike, %: NOT DETECTED g/dL

## 2011-02-28 LAB — SALICYLATE LEVEL: Salicylate Lvl: <4 mg/dL (ref 2.8–20.0)

## 2011-02-28 LAB — LACTIC ACID, PLASMA: Lactic Acid, Venous: 1.9 mmol/L (ref 0.5–2.2)

## 2011-02-28 LAB — CLOSTRIDIUM DIFFICILE BY PCR: Toxigenic C. Difficile by PCR: NOT DETECTED

## 2011-02-28 LAB — ANA: Anti Nuclear Antibody(ANA): NEGATIVE

## 2011-02-28 LAB — URINE MICROSCOPIC-ADD ON

## 2011-02-28 LAB — GLUCOSE, CAPILLARY: Glucose-Capillary: 114 mg/dL — ABNORMAL HIGH (ref 70–99)

## 2011-02-28 LAB — AMYLASE: Amylase: 19 U/L (ref 0–105)

## 2011-02-28 LAB — MRSA PCR SCREENING: MRSA by PCR: NEGATIVE

## 2011-02-28 LAB — PROTIME-INR: Prothrombin Time: 18.3 seconds — ABNORMAL HIGH (ref 11.6–15.2)

## 2011-02-28 LAB — CARBOXYHEMOGLOBIN
Carboxyhemoglobin: 0.5 % (ref 0.5–1.5)
Total hemoglobin: 8.6 g/dL — ABNORMAL LOW (ref 12.5–16.0)

## 2011-02-28 LAB — STREP PNEUMONIAE URINARY ANTIGEN: Strep Pneumo Urinary Antigen: NEGATIVE

## 2011-02-28 LAB — IGM: IgM, Serum: 24 mg/dL — ABNORMAL LOW (ref 60–263)

## 2011-02-28 LAB — LEGIONELLA ANTIGEN, URINE

## 2011-02-28 LAB — MAGNESIUM: Magnesium: 1.8 mg/dL (ref 1.5–2.5)

## 2011-02-28 LAB — ANTI-NEUTROPHIL ANTIBODY

## 2011-03-22 ENCOUNTER — Encounter: Payer: 59 | Attending: Physical Medicine & Rehabilitation

## 2011-03-22 ENCOUNTER — Ambulatory Visit (HOSPITAL_BASED_OUTPATIENT_CLINIC_OR_DEPARTMENT_OTHER): Payer: 59 | Admitting: Physical Medicine & Rehabilitation

## 2011-03-22 DIAGNOSIS — G245 Blepharospasm: Secondary | ICD-10-CM | POA: Insufficient documentation

## 2011-03-22 DIAGNOSIS — G243 Spasmodic torticollis: Secondary | ICD-10-CM
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
## 2011-05-01 NOTE — Assessment & Plan Note (Signed)
HISTORY OF PRESENT ILLNESS:  The patient has cervical dystonia and  blepharospasm.  The patient returns today; initial consultation was  09/09/07.   No new issues.  She is actually doing better.  TENS unit is helping with  her neck pain.  Physical therapy appears to be helping with her balance.   She still has some tenderness points in her neck.   PHYSICAL EXAMINATION:  GENERAL: No acute distress.  Mood and affect  appropriate.  NECK: Has some tenderness to palpation in the bilateral scapular area.  Upper extremity strength is good.  The patient has lateral collis and  rotation towards the left.   IMPRESSION:  Cervical dystonia, chronic neck pain.  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  Will focus on her pain and  function.   PLAN:  1. Will go ahead and continue physical therapy for balance.  2. Continue TENS trial, but I think she will end up with a purchase on      this given beneficial effects.  3. In terms of medications will continue hydrocodone 5/500 one by      mouth b.i.d.  4. Trigger point today.      Erick Colace, M.D.  Electronically Signed     AEK/MedQ  D:  09/29/2007 14:39:33  T:  09/30/2007 09:21:15  Job #:  [id number]   cc:   Donia Guiles, M.D.  Fax: [phone]   Jeremy Johann, Dr.

## 2011-05-01 NOTE — Assessment & Plan Note (Signed)
[name] follows up today.  She saw me last May 28, 2008.  She  continues to have pain in the left side of the neck.  She is starting to  get some blepharospasm.  She had a bad time at the beach.  She does not  get good relief from Klonopin 1 mg, instead 2 mg seems to be the  effective dose.  She needs to take this almost on a daily basis.   Her last botulinum type B injection was done March 30, 2008.  She had  injection of the left scalene x2, left levator scapulae x2, and over the  upper lid.   Her pain is currently at a 3, but averaging 5, interferes with activity  at 7.  Improves with heat, medication, and TENS.  Worse with activity as  well as prolonged sitting and standing.  She can walk 20 minutes at a  time, climbs steps, and drive.   REVIEW OF SYSTEMS:  Positive for depression, anxiety.   PHYSICAL EXAMINATION:  VITAL SIGNS:  Blood pressure 140/81, pulse 96,  respirations 18, and O2 saturation 99% on room air.  EXTREMITIES:  Without edema.  Coordination is normal.   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  She has tenderness in the  scalene area as well as levator scapulae and upper trap area.   IMPRESSION:  1. Cervical dystonia with blepharospasm.  2. Myofascial pain, left trapezius and levator scapulae area.   PLAN:  1. We will schedule Myobloc injection in the next couple of weeks.  In      the meantime, we will perform trigger point injection.  2. Increase Klonopin back to 2 mg daily.  3. Continue Vicodin 5/325, #45 per month.  She will take 1-2 per day.   We will increase her Myobloc dosage by 2500 units and use the extra in  the left trapezius.      Erick Colace, M.D.  Electronically Signed     AEK/MedQ  D:  06/25/2008 14:06:49  T:  06/26/2008 09:31:11  Job #:  [id number]

## 2011-05-01 NOTE — Assessment & Plan Note (Signed)
Ms. Schwarzkopf returns today.  I last saw her on March 30, 2008.  She  underwent a Myobloc injection for cervical dystonia with blepharospasm.  I injected the left lid area and then left scalene x2 and left levator  scapula x2.  She has had as good results as she did with prior  injection.  We did omit the orbicularis oculi injection and this really  has not had any impact on her overall outcome.   Her pain inventory is about 3/10 on average, interferes with activity at  a 3/10.  She does note some occasional jerking toward the left side, but  this is fairly rare and mild.  She can climb steps, she can drive.  She  has some anxiety from time to time, but otherwise negative on review of  systems.   PHYSICAL EXAMINATION:  VITAL SIGNS:  Blood pressure 119/69, pulse 108,  respirations 22, O2 saturations 99% on room air.  GENERAL:  No acute distress.  Mood and affect appropriate.  BACK:  Has no tenderness to palpation in the upper thoracic area.  She  has some tenderness over the left scalene area as well as levator  scapular area.  NECK:  Range of motion is 75% range forward flexion, extension, lateral  rotation, and bending.  She has no evidence of blepharospasm, but she  has occasional brief jerks left lateral.   IMPRESSION:  1. Cervical dystonia with blepharospasm, improved after Myobloc      injection.  2. Myofascial pain syndrome doing fairly well in terms of this.   PLAN:  She has enough Vicodin left really just taking about half of what  she is prescribed, the 5/325.  I will see her back in about a month to  reassess how she is doing and decide whether we need to reschedule her  for another injection.  I anticipate next time we will be able to reduce  her Klonopin dosage to 1 mg #60 and she is also taking these sparingly.      Erick Colace, M.D.  Electronically Signed     AEK/MedQ  D:  04/27/2008 17:24:54  T:  04/27/2008 18:14:46  Job #:  [id number]   cc:   Donia Guiles, M.D.  Fax: [phone]

## 2011-05-01 NOTE — Assessment & Plan Note (Signed)
[name] returns today.  She was last seen by me on January 05, 2008, at which point we did a left levator scapula trigger point, left  upper trapezius trigger point, and left suboccipital trigger point  injections.  Prior to that on December 23, 2007, she had a Myobloc  injection with 250 units in the left lid, 250 units in the left  radicularis oculi, lateral canthus bony ridge, 500 units into the left  scalene, 500 units in the left levator scapula.  She has had continued  excellent relief.  She has reduced her Klonopin dosage from 2 mg b.i.d.  to 1 mg daily.  She has reduced her hydrocodone from 5/500 b.i.d. to  taking 10 pills per month, on average.   Her average pain is about 1/10, going up to a 4 at times.  Sleep is  fair.  Relief from meds is excellent.  She can walk 20 minutes at a  time.  She climbs steps.  She drives.  She was not driving prior to her  injections.   PHYSICAL EXAMINATION:  Blood pressure 111/61, pulse 88, respirations 16,  O2 sat 95% on room air.  An overweight female in no acute distress.  Oriented x3.  Affect is  bright and alert.  Gait is normal.  She has no evidence of blepharospasm.  Her head is in the midline  position.  She can turn her head quickly, in terms of rotation, side to  side.  She has full neck range of motion.  Her upper extremity strength  is normal.   IMPRESSION:  1. Cervical dystonia, improved after Myobloc injection.  2. Blepharospasm, improved after Myobloc injection.  3. Myofascial pain, left upper trapezius and levator scapula, improved      after trigger point injection on January 05, 2008.   PLAN:  We will see her back in followup in one month.  Went over her  usual duration effect for Botulinum toxin.  Should she experience a loss  of efficacy prior to the next follow-up visit, we can change the  followup into an injection visit.      Erick Colace, M.D.  Electronically Signed     AEK/MedQ  D:  03/02/2008  09:24:32  T:  03/02/2008 10:19:06  Job #:  [id number]   cc:   Dr. Jeremy Johann

## 2011-05-01 NOTE — Procedures (Signed)
[name], [name]            ACCOUNT NO.:  [id number]   MEDICAL RECORD NO.:  [id number]          PATIENT TYPE:  REC   LOCATION:  TPC                          FACILITY:  [facility]   PHYSICIAN:  Erick Colace, M.D.DATE OF BIRTH:  1946-01-11   DATE OF PROCEDURE:  DATE OF DISCHARGE:                               OPERATIVE REPORT   PROCEDURE:  Trigger point injection, left upper trap, left levator  scapulae, and left scalene.   INDICATION:  Myofascial pain associated with cervical dystonia.  Pain is  only partially responsive to medication management including Klonopin  and hydrocodone.  She continues with home exercise program.   PROCEDURE:  Informed consent was obtained after describing risks and  benefits of the procedure to the patient.  These include bleeding,  bruising, and infection.  She elects to proceed and has given written  consent.  The patient placed in the seated position.  Area was marked  with Betadine prep, entered with 25 gauge 1-1/2-inch needle; l mL 1%  lidocaine was injected into each site followed by trigger point  deactivation.  The patient tolerated the procedure well.  Post-injection  instructions given.      Erick Colace, M.D.  Electronically Signed     AEK/MEDQ  D:  06/25/2008 14:03:09  T:  06/26/2008 02:27:59  Job:  [id number]   cc:   Donia Guiles, M.D.  Fax: [phone]   Rudy Jew. Ashley Royalty, M.D.  Fax: [phone]

## 2011-05-01 NOTE — Consult Note (Signed)
VASCULAR SURGERY CONSULTATION   [name], [name]  DOB:  1945-12-21                                       12/30/2008  HYQMV#:[id number]   REFERRING PHYSICIAN:  Donia Guiles, M.D.   REFERRAL DIAGNOSIS:  Bilateral carotid stenosis.   HISTORY:  The patient is a 65 year old female referred for evaluation of  carotid stenosis.   There is no history of stroke.  The patient denies sensory, motor or  visual deficit.  She has noted some decreased hearing in her left ear.  No speech problems.  No gait abnormality.  No syncope or presyncope.   Risk factors for cerebrovascular disease include hypertension and  hyperlipidemia.   PAST MEDICAL HISTORY:  1. Hypertension.  2. Hyperlipidemia.  3. Hypothyroidism.  4. Anxiety.  5. GERD.   PAST SURGICAL HISTORY:  1. Partial hysterectomy.  2. Cholecystectomy.   MEDICATIONS:  1. Isosorbide 30 mg b.i.d.  2. Ramipril 10 mg daily.  3. Amlodipine 5 mg daily.  4. Welchol 625 mg 2 tablets b.i.d.  5. Cymbalta 30 mg b.i.d.  6. Clonazepam 2 mg daily.  7. Hydrocodone 500 mg 1-2 tablets daily.   SOCIAL HISTORY:  The patient is married with three grown children.  She  worked as a Futures trader.  No tobacco use.  Discontinued tobacco use 18  years ago.  No regular alcohol use.   FAMILY HISTORY:  Mother is living age 44 with dementia.  Father living  age 39, has a history coronary artery disease, stroke and diabetes.  Two  brothers living, both with heart disease.   REVIEW OF SYSTEMS:  Refer to patient encounter form.  The patient does  note occasional chest tightness and shortness of breath with exertion.  History of depression.   ALLERGIES:  None known.   PHYSICAL EXAM:  General:  A well-appearing 65 year old female.  Moderately anxious.  Alert and oriented in no distress.  Vital signs:  BP 109/71 left arm, 107/72 right arm, pulse is 90 per minute and  regular.  Respirations 18 per minute.  HEENT:  Mouth and throat are  clear.  Normocephalic.  Extraocular movements intact.  Neck:  Supple.  No thyromegaly or adenopathy.  Respiratory:  Distant breath sounds.  Air  entry equal bilaterally.  No rales or rhonchi.  Normal percussion.  Cardiovascular:  Right carotid bruit.  Normal heart sounds without  murmurs.  Regular rate and rhythm.  No gallops or rubs.  Abdomen:  Soft,  nontender.  No masses or organomegaly.  Normal bowel sounds.  Neurological:  Cranial nerves intact.  Strength equal bilaterally.  1+  reflexes.  Normal gait.  Normal sensation.  Skin:  Intact without rashes  or ulceration.  Extremities:  Full range of motion.  No joint deformity.  No ankle edema.   INVESTIGATIONS:  Carotid duplex ultrasound 12/03/2008 reveals 50-69% ICA  stenosis bilaterally.  Antegrade vertebral flow present bilaterally.   IMPRESSION:  1. Asymptomatic moderate bilateral internal carotid artery stenosis.  2. Hypertension.  3. Hyperlipidemia.  4. Hypothyroidism.  5. Anxiety.  6. Gastroesophageal reflux disease.   RECOMMENDATIONS:  The patient has asymptomatic moderate bilateral  internal carotid artery stenosis.  She was instructed to take aspirin 81  mg daily.  Follow up in the office in 6 months.  Cautioned regarding the  onset of new neurologic symptoms including motor, sensory or visual  deficit to please notify the office should these occur.   Balinda Quails, M.D.  Electronically Signed  PGH/MEDQ  D:  01/06/2009  T:  01/06/2009  Job:  [id number]   cc:   Donia Guiles, M.D.

## 2011-05-01 NOTE — Assessment & Plan Note (Signed)
Ms. Fabre returns today.  She was last seen by me on August 10, 2008, at which time I injected 5000 units of Myobloc.  The following  muscles were injected, left levator palpebrae 500 units, left  orbicularis oculi at the lateral canthus 500 units, levator scapula 500  units x2, left trapezius 500 units x4 in the upper area, and scalene  left 500 units x2.  The patient has done very well, in fact this worked  better than the 2500 units dosing that we utilized in April 2009.  Her  pain is down to a 2/10.  She is taking minimal amounts of both Klonopin  and hydrocodone.  In fact, she is still taking a prescription for  clonazepam written on July 20, 2008, and of a hydrocodone written #45  on August 09, 2008.   Social, son has developed seizure disorder.   Oswestry disability questionnaire completed today September 07, 2008, is  20%.  He indicated minimal disability.   PHYSICAL EXAMINATION:  She has no tenderness to palpation in the upper  trapezius and scalenes and in the upper extremity.  She has no evidence  of blepharospasm.  She has evidence of myoclonic-type jerking towards  her left side.   Gait is normal.  Mood and affect are bright.  Orientation x3.   IMPRESSION:  Left cervical dystonia with blepharospasm.   PLAN:  We will continue the Myobloc every 3 months, will actually see  her back at 8-month mark, decide whether she is ready for reinjection at  that time or whether she can go a few weeks longer.      Erick Colace, M.D.  Electronically Signed     AEK/MedQ  D:  09/07/2008 11:20:51  T:  09/08/2008 03:00:46  Job #:  [id number]   cc:   Donia Guiles, M.D.  Fax: [phone]

## 2011-05-01 NOTE — Procedures (Signed)
NAMEBOBBE, QUILTER            ACCOUNT NO.:  [id number]   MEDICAL RECORD NO.:  [id number]          PATIENT TYPE:  REC   LOCATION:  [facility]                          FACILITY:  [facility]   PHYSICIAN:  Erick Colace, M.D.DATE OF BIRTH:  04-02-1946   DATE OF PROCEDURE:  11/09/2008  DATE OF DISCHARGE:                               OPERATIVE REPORT   PROCEDURE:  This is mild block injection.   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   Dilution is 5000 units in 1 mL, 26-gauge 1-inch needle electrode with  the exception of 27-gauge 5/8-inch regular needle for the lid on the  left side.   Areas marked and prepped with Betadine, including 2 areas of the left  scalene, 2 areas of the left levator scapulae, 4 areas of the left  trapezius, 1 area of left orbicularis oculi lateral to the lateral  canthus, and over the left lid levator palpebrae.  After appropriate EMG  activity obtained, 0.1 mL of solution was injected into each site.  The  patient tolerated the procedure well.  Post-injection instructions  given.      Erick Colace, M.D.  Electronically Signed     AEK/MEDQ  D:  11/09/2008 15:53:30  T:  11/10/2008 02:05:40  Job:  [id number]

## 2011-05-01 NOTE — Assessment & Plan Note (Signed)
[name] follows up today.  She had a Myobloc injection for  spasmodic torticollis as well as some blepharospasm.  We injected both  the orbicularis oculi as well as levator palpebrae, as well as the  pretarsal injection, in addition with the scalene, levator scapulae,  and left trapezius.   She returns with marked improvement in her symptoms.  She has no visible  blepharospasm, no visible cervical dystonia.   Her activity level has improved.  She has some pain at the end of the  day around 3 o'clock.    She has had some carotid Dopplers ordered by her primary physician, I  do not have those results.  Her pain right now is 2/10.   REVIEW OF SYSTEMS:  Positive for numbness, tingling, and anxiety.   PHYSICAL EXAMINATION:  Her blood pressure is 147/82, respiratory rate  18, and O2 sat is 96% on room air.  Mood and affect are appropriate.  Her motor strength is full of her  extremities.  Cranial nerves II through XII intact.  Orientation x3.  Affect is alert.  Gait is normal.   IMPRESSION:  Cervical dystonia with blepharospasm, improved after  Myobloc injection.  We will see her back in 2 months.      Erick Colace, M.D.  Electronically Signed     AEK/MedQ  D:  12/07/2008 16:06:42  T:  12/08/2008 05:09:56  Job #:  [id number]

## 2011-05-01 NOTE — Procedures (Signed)
[name], [name]            ACCOUNT NO.:  [id number]   MEDICAL RECORD NO.:  [id number]          PATIENT TYPE:  REC   LOCATION:  [facility]                          FACILITY:  [facility]   PHYSICIAN:  Erick Colace, M.D.DATE OF BIRTH:  Jul 17, 1946   DATE OF PROCEDURE:  03/25/2008  DATE OF DISCHARGE:                               OPERATIVE REPORT   PROCEDURE:  Trigger point injection of left levator scapula as well as  left upper trapezius at level of C6.   INDICATIONS:  Myofascial pain syndrome only partially responsive to  medication management including narcotic analgesics and benzodiazepines.  She has had good relief with prior trigger point injection performed on  January 05, 2008 but this has worn off over the last month or so.   Informed consent obtained after describing risks and benefits of  procedure to the patient.  These include bleeding, bruising and  infection.  She elects to proceed and has given written consent.  The  patient was placed in a seated position.  The areas just anterior of the  trapezius and posterior to the sternocleidomastoid were marked and  prepped with Betadine, entered with a 25 gauge 1-1/2 inch needle and 1/2  mL of 1% lidocaine injected with trigger point deactivation.  Then, the  upper trapezius lateral border at level of C6 was marked, prepped with  Betadine, entered with 25 gauge 1-1/2 inch needle and 1/2 mL of 1%  lidocaine was injected with trigger point deactivation.  The patient  tolerated procedure well.  Post injection instructions given.      Erick Colace, M.D.  Electronically Signed     AEK/MEDQ  D:  03/25/2008 08:48:34  T:  03/25/2008 09:16:46  Job:  [id number]

## 2011-05-01 NOTE — Procedures (Signed)
[name], [name]            ACCOUNT NO.:  [id number]   MEDICAL RECORD NO.:  [id number]         PATIENT TYPE:  HREC   LOCATION:                                 FACILITY:   PHYSICIAN:  Erick Colace, M.D.DATE OF BIRTH:  1946/10/11   DATE OF PROCEDURE:  DATE OF DISCHARGE:                               OPERATIVE REPORT   This is a mild block injection procedure note.   INDICATION:  Cervical dystonia with blepharospasm, not responsive to  physical therapy or other conservative means.  She has had prior good  responsive to mild block.   Dilution is 5000 units per 1 mL.   Under EMG guidance, 0.1 mL of solution were injected into each of the  following site; left levator palpebrae, left orbicularis oculi at the  bony ridge lateral to the lateral canthus.  Left scalene musculature x2,  left levator scapula muscle injected both in the posterior fossa as well  as proximal to the upper medial scapular border.  In addition, 3 areas  of the left trapezius were injected.  The patient tolerated the  procedure well.  Appropriate EMG activity was obtained prior to each  injection.  Post injection instructions given.   I have written hydrocodone 5/500, #45, to last for 1 month.  She already  has some Klonopin.      Erick Colace, M.D.  Electronically Signed     AEK/MEDQ  D:  08/10/2008 17:29:09  T:  08/11/2008 07:16:23  Job:  [id number]

## 2011-05-01 NOTE — Assessment & Plan Note (Signed)
[name] returns today.  She was last seen by me 12/23/2007.  She  had Myobloc injection done on 12/23/2007.  She had 2 areas on the left  scalenes injected 500 units per site then 2 areas on the levator scapula  500 units per site and 500 units to the upper lid on the left side as  well.  Patient is quite happy with the results.  She has had no  complications.  She has had near elimination of her blepharospasms and  great improvement in her laterocollis.  Husband states when she is very  tired she starts having some increased head leaning toward the left but  this is fairly rare.   She continues to have some pain in the upper trapezius area as well as  the upper medial scapular border on the left side.   Pain interferes with enjoyment of life at a moderate level.  Sleep is  fair.  She continues to drive.  She needs some assistance with household  duties and shopping.   PHYSICAL EXAMINATION:  VITAL SIGNS:  Blood pressure 121/77.  Pulse 87.  Respirations 18.  O2 sat 95% on room air.  GENERAL:  No acute distress.  Mood and affect appropriate.  She appears  bright.  No evidence of blepharospasms.  Head is midline.  She has tenderness to palpation in the left upper  trapezius as well as left upper medial scapular border and left  suboccipital area.  Gait is without any deviation.   IMPRESSION:  1. Improvement in the cervical dystonia following Myobloc injections.  2. Cervicalgia due to myofascial pain syndrome.  Previous good      response to trigger-point injection.   PLAN:  1. We will see her back in 2 months for re-evaluation to see if the      Myobloc is starting to wear off.  2. Inject trigger points as described above.  3. She does not require any medication at this time.  Her last      hydrocodone prescription was 12/23/2007 and she still has some      left.  Also still taking clonazepam 2 mg p.o. b.i.d. and last      filled 12/23/2007.      Erick Colace,  M.D.  Electronically Signed     AEK/MedQ  D:  01/05/2008 15:38:39  T:  01/05/2008 16:24:58  Job #:  [id number]

## 2011-05-01 NOTE — Procedures (Signed)
[name], [name]            ACCOUNT NO.:  [id number]   MEDICAL RECORD NO.:  [id number]          PATIENT TYPE:  REC   LOCATION:  [facility]                          FACILITY:  [facility]   PHYSICIAN:  Erick Colace, M.D.DATE OF BIRTH:  1946-09-13   DATE OF PROCEDURE:  DATE OF DISCHARGE:                               OPERATIVE REPORT   PROCEDURE:  This is a botulinum toxin injection type B under needle EMG  guidance.   INDICATIONS:  Cervical dystonia with blepharospasm.   Dilution is 2500 units and 0.5 mL.  Needle is a 27-gauge, 1-inch needle  electrode.   DESCRIPTION OF PROCEDURE:  Area was marked, prepped with Betadine.  Area  over the left lid first injected with 0.1 mL of the Myobloc solution.  Then two areas of the left scalene and two areas over the left levator  scapula were injected after negative drawback for blood.  The patient  tolerated procedure well.  Should she have less than optimal results,  may adjust so that we incorporate the left orbicularis oculi lateral to  the lateral canthus on the bony ridge once again.      Erick Colace, M.D.  Electronically Signed     AEK/MEDQ  D:  03/30/2008 10:01:09  T:  03/30/2008 10:12:49  Job:  [id number]

## 2011-05-01 NOTE — Procedures (Signed)
[name], [name]            ACCOUNT NO.:  [id number]   MEDICAL RECORD NO.:  [id number]          PATIENT TYPE:  REC   LOCATION:  [facility]                          FACILITY:  [facility]   PHYSICIAN:  Erick Colace, M.D.DATE OF BIRTH:  03-01-1946   DATE OF PROCEDURE:  08/04/2009  DATE OF DISCHARGE:                               OPERATIVE REPORT   PROCEDURE:  Botulinum toxin type B injection, Myobloc.   INDICATIONS:  1. Spastic torticollis.  2. Cervical dystonia.  3. Blepharospasm with cranial nerve VII dysfunction.   Pain and muscle spasms are only partially responsive to medication  management.  She has had good effect from Myobloc injection with  duration effect approximately 3 months.   Informed consent was obtained after describing risks and benefits of the  procedure to the patient.  These include bleeding, bruising, infection  as well as distant effective toxin spread, dysphagia, local  hypersensitivity reaction as well as dry eye and dry mouth, she elects  to proceed.   The patient was placed in a seated position, 4 areas in the left upper  trap mark prepped with Betadine, 0.1 mL of Myobloc 5000 units/mL were  injected into each site.  Then, 2 areas left levator scapula just on the  upper medial scapular border were marked and prepped with Betadine and  0.1 mL of Myobloc injected into each site.  Two areas over the left  scalene muscles were marked and prepped, and entered with a 26-gauge 1-  inch needle electrode under needle EMG guidance.  Appropriate activity  was obtained and 0.1 mL of Myobloc were injected into each site.   Then, a 27-gauge 5 Ace inch needle was used to do the pretarsal  injection, 0.1 mL of Myobloc injected on the left side.  End of the area  of the orbicularis oculi lateral to lateral canthus, 0.1 mL of Myobloc  injected at that area.  Pre- and post-injection instructions given.  I  will see her back in 3 months for repeat injection.      Erick Colace, M.D.  Electronically Signed     AEK/MEDQ  D:  08/04/2009 13:19:08  T:  08/05/2009 01:32:21  Job:  [id number]

## 2011-05-01 NOTE — Assessment & Plan Note (Signed)
Date of last visit was September 09, 2007.  A 65 year old female with  gradual onset of neck stiffness progressing to abnormal movements of the  neck and pain in the neck over a 2-year period of time.  She has tried  chiropractic, acupunctures, hypnosis.  She was seen by local neurology,  as well as referral to [facility] at [facility], a movement disorder  specialist.  She has been treated with Klonopin and Vicodin primarily  and questionable results with Botox injections.  She did respond well to  a trigger point injection on October 01, 2007, through my office.   Pain score is in the 6 to 9 range on average, currently only 1/10 but  this is just at the present moment.  Pain interferes moderately with  enjoyment of life and general activity.  Sleep is fair.  Pain improves  with heat therapy, meds injections.  She can walk 20 minutes at a time.  She climbs steps.  She drives.  She needs assistance with shopping and  household duties.   Her blood pressure is 120/68.  Pulse 78.  Respirations 20.  O2 sat 96%  room air.   EXAM:  She has prominent scalene muscles on the left side intermittently  contracting lateral movements of the head toward her left shoulder,  rotation toward the left mainly, limited neck extension, flexion,  lateral rotation, bending.  No tenderness over the sternocleidomastoid.  No significant midline tenderness of the cervical spine.  Extremity  strength in the upper and lower extremities is normal and the deltoid  bicep,tricep, grip, as well as hip flexion, knee extension, or ankle  dorsiflexion.  Rapid alternating movements and finger-nose-to-finger  testing are normal.  Deep tendon reflexes are normal.  She has  blepharospasm noted in the left eye but no lower facial abnormal  movements.   IMPRESSION:  1. Cervical dystonia and chronic neck pain.  2. Cervical myofascial pain.   PLAN:  1. Trigger point injection today, left levator scapula.  2. Follow up at [facility] with  Dr. Georgina Pillion .      Erick Colace, M.D.  Electronically Signed     AEK/MedQ  D:  11/11/2007 10:03:54  T:  11/11/2007 11:05:26  Job #:  [id number]

## 2011-05-01 NOTE — Procedures (Signed)
NAMECAMELIA, [name]            ACCOUNT NO.:  [id number]   MEDICAL RECORD NO.:  [id number]          PATIENT TYPE:  REC   LOCATION:  [facility]                          FACILITY:  [facility]   PHYSICIAN:  Erick Colace, M.D.DATE OF BIRTH:  September 03, 1946   DATE OF PROCEDURE:  04/27/2009  DATE OF DISCHARGE:                               OPERATIVE REPORT   PROCEDURE:  Botulinum toxin type B injection.   INDICATIONS:  1. Spastic torticollis.  2. Cervical dystonia.  3. Blepharospasm with cranial nerve VII dysfunction.   Pain and muscle spasm are only partially responsive to medication  management.  She has had good block with Mayo block injection with  induration of effect approximately 3 months.   Informed consent was obtained after describing risks and benefits  procedure with the patient.  These include bleeding, bruising, infection  as well as distant defect with toxin spread, dysphagia, local  hypersensitivity reaction, as well as dry as dry eyes and dry mouth.  She elects to proceed.   The patient is placed in a seated position.  Four areas of the left  upper trapezius were marked and prepped with Betadine, 0.1 ML of Mayo  block, 5000 units/mL were injected into each site.  Two areas of the  left levator scapula marked and prepped with Betadine, 0.1 mL of Mayo  block injected into each site.  Two areas of left scalene muscle were  marked and prepped with Betadine, entered with needle electrode and 0.1  mL of Mayo block injected.  Left orbicularis oculi lateral to the  lateral canthus marked prepped with Betadine, entered with the 26 gauge,  1-inch needle electrode and 0.1 mL Mayo block injected.  Needle EMG  guidance used for all injections appropriate.  EMG activity was obtained  for all muscle groups injected.  The pretarsal area was injected without  the use of an EMG guidance and 0.1 mL of the Mayo block solution was  utilized.  The patient tolerated procedure well.  Pre and post  injection  vitals stable.  Post injection instructions given.  I will see her back  in 3 months for repeat injection.      Erick Colace, M.D.  Electronically Signed     AEK/MEDQ  D:  05/02/2009 15:39:19  T:  05/03/2009 05:59:57  Job:  [id number]

## 2011-05-01 NOTE — Procedures (Signed)
[name], [name]            ACCOUNT NO.:  [id number]   MEDICAL RECORD NO.:  [id number]          PATIENT TYPE:  REC   LOCATION:  [facility]                          FACILITY:  [facility]   PHYSICIAN:  Erick Colace, M.D.DATE OF BIRTH:  1946-05-30   DATE OF PROCEDURE:  DATE OF DISCHARGE:                               OPERATIVE REPORT   DATE OF SERVICE:  01/05/08.   PROCEDURE:  Trigger point injection left levator scapula, left upper  trapezius, and left suboccipital area.   INDICATIONS:  Neck pain only partially relieved by medication management  including hydrocodone she has had previous.  Previous  good relief with  point injections last performed October 14, 2007.   PROCEDURE:  Areas on the left marked prepped with Betadine entered 25-  gauge 1-1/2 needle 1 cc of 1% lidocaine injected each site.  Positive  twitch response obtained in the upper trapezius point which was at the  midclavicular line.  The patient tolerated procedure well.  Post  injection instructions given.      Erick Colace, M.D.  Electronically Signed     AEK/MEDQ  D:  01/05/2008 15:34:03  T:  01/06/2008 05:24:33  Job:  [id number]
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
## 2011-05-01 NOTE — Procedures (Signed)
NAMEYASMINA, CHICO            ACCOUNT NO.:  [id number]   MEDICAL RECORD NO.:  [id number]           PATIENT TYPE:   LOCATION:                                 FACILITY:   PHYSICIAN:  Erick Colace, M.D.DATE OF BIRTH:  [birth year]   DATE OF PROCEDURE:  DATE OF DISCHARGE:                               OPERATIVE REPORT   PROCEDURE:  Botulinum toxin type B injection.   INDICATIONS:  1. Cervical dystonia.  2. Blepharospasm.   Pain and muscle spasm are only partially responsive to medication  management.  She has had good results with Myobloc injection in the  past.   Informed consent was obtained after describing the risks and benefits of  the procedure with the patient.  These include bleeding, bruising,  infection, as well as effects from the toxin itself such as dysphagia  and local hypersensitivity reaction.  She elects to proceed.   The patient in a seated position.  Four areas over the left upper  trapezius were marked and prepped with Betadine and 0.1 mL of Myobloc  5000 unit/mL was injected into each site.  Two areas of the left levator  scapula marked, prepped with Betadine, and 0.1 mL of the Myobloc 5000  unit/mL solution was injected.  Two areas of the left scalene marked and  prepped with Betadine, entered with a needle electrode under EMG  guidance.  Appropriate EMG activity obtained.  0.1 mL of the Myobloc  solution was injected.  Left orbicularis oculi, lateral to the lateral  canthus marked, prepped with Betadine.  Appropriate EMG activity  obtained followed by injection of 0.1 mL of Myobloc solution and  pretarsal injection, marked, prepped with Betadine.  A 26-gauge 1-inch  needle electrode inserted, appropriate EMG activity followed by  injection of 0.1 mL of the Myobloc solution.  The patient tolerated the  procedure well.  Pre and post injection vitals stable.  Post injection  instructions given.      Erick Colace, M.D.  Electronically  Signed     AEK/MEDQ  D:  02/07/2009 12:30:00  T:  02/08/2009 00:58:02  Job:  [id number]

## 2011-05-01 NOTE — Assessment & Plan Note (Signed)
OFFICE VISIT   YONG, WAHLQUIST  DOB:  Mar 15, 1946                                       08/04/2010  WUJWJ#:[id number]   This is a 65 year old pleasant female who presents with a chief  complaint of falls.  She notes approximately 2 year history of falling  not associated with any vertigo or dizziness.  She says she finds  herself suddenly on the floor.  To date these falls have resulted in  fracturing her jaw requiring replacement of her upper teeth and  fractured of her right wrist.  She does not note any dizziness or  vertigo.  No other neurologic symptoms are associated with these falls.  She has no prodrome with this.  She has not noticed any amaurosis fugax  and does not have any neurologic symptoms such as upper extremity or  lower extremity weakness.  No facial asymmetry or slurring with these  episodes.  At this point, she is undergoing cardiac, neurologic and also  vascular evaluation as possible etiologies for there this.  No known  elements have been found to exacerbate this or trigger these episodes.  She denies any previous episodes of symptoms consistent of with  vertebrobasilar insufficiency and additionally she has no known seizure  history.  She notes an extensive difficult home situation requiring care  of both of her parents and also a disabled child. Her husband is  frequently traveling as part of his occupation, so she does have  significant psychostress.  She notes that she has undergone previous  psychologic counseling.   PAST MEDICAL HISTORY:  #1.  Cervical dystonia.  #2.  Major depression.  #3.  Cervical cancer in situ.  #4.  Acute appendicitis.  #5.  She is not clear area of what type of gallbladder condition she had  that required removal of her gallbladder  #6,7. She has fractured her right foot and right hand.  #8.  Maxillary fracture.   PAST SURGICAL HISTORY:  Total abdominal hysterectomy, appendectomy,  cholecystectomy, open reduction and fixation of her foot and hand and  also her jaw.   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  She does not smoke currently but quit in 1988.  Exact pack year history  is not available.  Alcohol is denied as was illicit drug use.   FAMILY HISTORY:  Father has had multiple MIs and CVA.  Mother has  Alzheimer's disease.  Her brothers have had multiple coronary  interventions.   REVIEW OF SYSTEMS:  FAO:ZHYQMVH for weight gain and dizziness.  MUSCULOSKELETAL:  Arthritis and muscle pain.  CARDIAC:  Chest tightness, pressure, shortness of breath with exertion.  PSYCHIATRY:  Anxiety and depression.  The rest of the 12 point review of system was otherwise negative as  noted in the patient's chart.   MEDICATIONS:  Vicodin, clonazepam, Abilify and Zoloft.   ALLERGIES:  Denied any known drug allergies.   PHYSICAL EXAMINATION:  She had a temperature of 99 degrees, blood  pressure 156/61 and heart rate of at 80, respirations were 12.  GENERAL:  She is well developed, mildly obese, in no distress.  ENT:  Externally ears were atraumatic and within normal.  The nasal  septum was midline without any drainage.  Oropharynx demonstrates some  caries in the lower teeth and evidence of recent  dental work on the  upper teeth.  Oropharynx demonstrated no erythema or any exudate.  EYES:  Her pupils were equal, round, and reactive to light.  Extraocular  movements were intact.  There is no scleral injection or icterus.  CARDIAC:  Regular and rate rhythm.  Normal S1, S2.  No murmurs, rubs,  thrills or gallops.  The pulses throughout were all palpable.  The  carotid arteries had no bruits on auscultation.  PULMONARY:  Symmetric expansion.  Good air movement throughout all  fields.  Clear to auscultation bilaterally.  There is no wheezing,  rhonchi or rales.  MUSCULOSKELETAL:  The right arm cannot be extensively tested due to the  fracture in  the wrist and also residual pain in the shoulder.  Otherwise  the rest of her body was 5/5 muscle strength.  SKIN:  There were no obvious lesions visualized and no known signs of  any clubbing in the nails.  NEUROLOGICAL:  Cranial nerves II-XII were intact.  Gait testing was  deferred due to the patient's previous history of falls.  Sensation  grossly was intact  EXTREMITIES:  The right hand cannot be fully tested due to the placement  of what seemed to be a partial cast.  PSYCHIATRIC:  She demonstrates good judgment but definitely depressed  mood with some elements of anxiety and her affect demonstrated full  range at this point.  LYMPHATIC:  Bilateral axilla, groin, cervical and supraclavicular basins  did not demonstrate any palpable lymph nodes.   I reviewed this patient's bilateral carotid duplex exam and it  demonstrates no evidence of internal carotid artery stenosis on either  side and antegrade vertebral artery flow bilaterally.   ASSESSMENT:  This is a 65 year old female who now presents with  worsening frequency of fall attacks.  Etiology is unknown at this point.  Based on my evaluation and carotid duplex, however, there is no evidence  of internal carotid stenosis disease as this etiology.  In addition, the  vertebral arteries had normal flow and antegrade flow.  There is no  evidence at this point of any of vertebrobasilar insufficiency.  So my  recommendation is to continue with the cardiac and neurologic workup and  at this point.  This  patient can follow up as needed.  Additionally,  she should continue with her psychiatric medications for her major  depression and continue counseling.   Thank you very much for this consult and allowing [place] to participate in  this patient's care.     Leonides Sake, MD  Electronically Signed   BC/MEDQ  D:  08/04/2010  T:  08/07/2010  Job:  [id number]

## 2011-05-01 NOTE — Procedures (Signed)
[name], [name]            ACCOUNT NO.:  [id number]   MEDICAL RECORD NO.:  [id number]          PATIENT TYPE:  REC   LOCATION:  [facility]                          FACILITY:  [facility]   PHYSICIAN:  Erick Colace, M.D.DATE OF BIRTH:  1946/01/27   DATE OF PROCEDURE:  12/23/2007  DATE OF DISCHARGE:                               OPERATIVE REPORT   PROCEDURE:  Botulinum toxin injection for cervical dystonia.   INDICATIONS:  Cervical dystonia not responsive to medication management.   The procedure was done under EMG guidance. Informed consent was obtained  after describing risks and benefits to the patient.  These included  bleeding, bruising, infection, and difficulty swallowing.  She elects to  proceed and has given written consent. The patient is placed in a semi-  Collins posture on the exam table.  Two areas on the left scalene muscle  and two areas of the left levator scapula anterior to the trapezius were  marked and prepped with Betadine, entered with a 27 gauge 1 inch needle  electrode under needle EMG guidance, appropriate EMG activity obtained.  500 units of mild block were injected into each site.  In addition, I  did an injection for her blepharospasm which is associated with her  hemifacial spasm.  We injected the left lid with 250 units and the left  orbicularis ocular lateral to the lateral canthus on the bony ridge.  The patient tolerated the procedure well. Post injection instructions  were given.  Total units utilized 2500 botulinum toxin type B kept at  original dilution of 2500 units and 0.5 mL.      Erick Colace, M.D.  Electronically Signed     AEK/MEDQ  D:  12/23/2007 16:01:00  T:  12/23/2007 19:23:05  Job:  [id number]

## 2011-05-01 NOTE — Assessment & Plan Note (Signed)
[name] returns today.  She saw me approximately 1 month ago.  In  the interval time, she has seen Dr. Ashley Royalty from [facility].  They discussed  dorsal rhizotomy, which is now felt to be particularly helpful in this  situation.  The patient has had good relief overall with her Myobloc.  She does continue to have some pain along the left side of the neck in  the cervical paraspinal, as well as the lateral neck area.  Her average  pain is 1 in the morning, but gets 2/5 in the afternoon.  She has had  good success with combination of both Klonopin and hydrocodone.  I did  reduce her Klonopin from 2 mg to 1.  However, this resulted in some  reduction of efficacy per the patient's report.  On the other hand, she  is doing well on the reduced dose of hydrocodone.   She can walk 15 minutes at a time.  She can climb steps.  She drives.  She has some difficulty with household duties and shopping.   REVIEW OF SYSTEMS:  Positive for anxiety as well as sleep problems.  Her  pain interference score is in the 2-4 range.   SOCIAL HISTORY:  Continues to have busy lifestyle.  Has to raise a  foster child.   PHYSICAL EXAMINATION:  Her blood pressure is elevated at 156/86, pulse  112, respirations 20, and O2 saturating 100% on room air.  General:  No  acute distress.  Orientation x3.  Affect if alert.  Gait is normal.   Her neck has intermittent lateral collis towards the left side.  This is  very brief, almost myoclonic-type jerks, but of low amplitude.  She  exhibits no blepharospasm at the current time.  Her cranial nerves II-  XII are intact.  Her upper extremity strength is normal.  Deep tendon  reflexes normal in the upper extremities.   She does have tenderness to palpation in the left cervical paraspinous,  also over the left levator scapulae muscle.   IMPRESSION:  1. Movement disorder.  2. Cervical dystonia, combination with blepharospasm, improved after      Myobloc injection.  3.  Myofascial pain, left trapezius and levator scapulae area.   PLAN:  1. We will continue Vicodin 5/325, #45 for 1 month.  She will take 1-2      per day.  2. Klonopin.  Reduced her dose from 2 mg to 1 mg.  We will allow her      to take two 1 mg tablets if needed at a time but otherwise next      time for renewal, consider going back up to the 2 mg dosage.  3. I will see her back in 1 month.  I think by then it will be about 3      months since the Myobloc.  She would like to try to stretch out the      injections a bit, and we will temporize it with trigger point      injection in 1 month and then      we will repeat Myobloc in August.  Her pill counts were 41 left out      of 45 with hydrocodone and 55 out of the original 60 of the      Klonopin, and these refill dates were May 21, 2008.      Erick Colace, M.D.  Electronically Signed     AEK/MedQ  D:  05/28/2008  13:52:05  T:  05/29/2008 07:20:57  Job #:  [id number]   cc:   Fayrene Fearing A. Ashley Royalty, M.D.  Fax: [phone]   Donia Guiles, M.D.  Fax: [phone]

## 2011-05-01 NOTE — Consult Note (Signed)
REASON FOR CONSULTATION:  The consult was requested in regards to neck  pain related to cervical dystonia and blepharospasm.   HISTORY OF PRESENT ILLNESS:  The patient is a 65 year old female who has  had a gradual onset of neck stiffness progressing to abnormal movements  of the neck and pain in the neck, progressing over a two year period of  time.  She saw her primary care physician.  She has tried chiropractic  acupuncture and hypnosis.  She was referred to neurology, had a brain  MRI that was difficult to read because of constant movement.  She had  not had any cervical imaging.  She has also seen cardiology,  dermatology, and rheumatology.  Her average pain is rated at an 8 out of  10, focusing mainly on the left side of the neck.  She has had a Botox  injection per Dr. Thad Ranger at [facility] who injected somewhere  around either 100 or 200 units of Botox and including the orbicularis  oculi.  This apparently was not particularly helpful and the patient was  referred to Dr. Jeremy Johann, movement disorder specialist at [facility] and  she performed a botulin toxin injection as well.  Once again the records  are not available for this.   Other medications prior included Klonopin and Vicodin.  Also a note was  made to be tried on Cogentin but she was not clear whether she actually  ever received this.   CURRENT MEDICATIONS:  The current medications include Effexor-SR 75  micrograms q. day, clonazepam two tablets per day, one mg dosage, and  hydrocodone 5 mg b.i.d.   PAST MEDICAL HISTORY:  The past history is also significant for  hypothyroidism.  She had pre-existing depression prior to the onset of  her complaints.  She has also a past history of sleep apnea and has had  weight gain since the onset of her neck problems.   SOCIAL HISTORY:  The patient lives with her husband and her son.   FUNCTIONAL:  The patient needs some assistance with meal prep, household  duties, and  shopping due to her abnormal neck movements, she rarely  drives.   PHYSICAL EXAMINATION:  VITAL SIGNS:  Blood pressure is 119/89, pulse of  89, respirations of 18, O2 saturation is 93% on room air.  GENERAL:  The patient is in no acute distress.  Mood and affect are  appropriate.  NECK:  The neck has rather prominent scaly muscles on the left side  which appear to be intermittently contracting.  She has both lateral  movements of her head towards her left shoulder as well as rotation  towards the left mainly.  She has limited neck extension, flexion,  lateral rotation, and bending actively to about the 50% range.  She has  tenderness over the scalene muscles as well as the lateral aspects of  the upper trapezius.  There is no significant tenderness over the  sternocleidomastoid.  There is no significant tenderness midline in the  cervical spine.  EXTREMITIES:  Her upper extremity and lower extremity strength were  normal.  The upper extremity and lower extremity range of motion was  normal.  Deep tendon reflexes are normal.  Rapid alternating movements,  finger-nose-finger are normal.  HEENT:  She has blepharospasm noted in the left eye greater than the  right eye and no abnormal facial movements are noted as described above.  NEUROLOGICAL:  Her gait shows no evidence of toe drag or knee  instability, no ataxia.   IMPRESSION:  Cervical dystonia with chronic neck pain.  She may have  some underlying cervical spondylosis which has been aggravated by the  fairly constant active movements and lateral Colles which may be causing  some facet syndrome as well.  Certainly it would be difficult to do any  type of cervical injections other than intramuscular.  She is planning a  follow up with Dr. Georgina Pillion and it is unclear at this point whether she  will have a repeat Botox injection at that time.   RECOMMENDATIONS:  1. Will try physical therapy, some manual therapy plus eval with TENS       unit.  Will have one of the neuro therapists see her to see if      there is any other type of exercises such as optokinetic.  2. Will give her a trial of voltaren gel to be placed over the left      sided cervical musculature as have had other cervical dystonia      patients that have had some benefit with this.  3. Will see her back in 2-3 weeks, will also check a urine drug screen      and if negative for illicit drugs or other non-disclosed opiates,      can resume her hydrocodone prescriptions.   Thank you very much for this interesting consultation.      Erick Colace, M.D.  Electronically Signed     AEK/MedQ  D:09/09/2007 17:08:27  T:09/10/2007 11:45:30  Job #:  [id number]   cc:   Erick Colace, M.D.  Fax: [phone]   Donia Guiles, M.D.  Fax: [phone]

## 2011-05-01 NOTE — Assessment & Plan Note (Signed)
The patient is a 65 year old female with cervical dystonia, cervical and  trapezius myofascial pain syndrome and possible blepharospasm.  She has  had good relief with levator scapula trigger point injections and has  had continued effect from the last injection, October 14, 2007.  She has  had good relief with the TENS unit that she has purchased since last  visit and wears this at least an hour a day.   She is looking to establish with psychiatry for depression.  She  reportedly was increased on her clonazepam dose to 2 mg b.i.d. and had  hydrocodone two b.i.d., although, I do not see that in the note.   Her average pain is in the 2/10 range, but goes up to 9 at times.  General activity interference is at a moderate level.  Her pain is  described as sharp, stabbing, constant left side of the neck, improves  with rest, medication, and TENS unit.  She has good relief from  medicines.   REVIEW OF SYSTEMS:  Positive for trouble walking, although, this has  improved since physical therapy.  She has depression and anxiety.  Functional status is independent, however, she does not drive.   PHYSICAL EXAMINATION:  VITAL SIGNS:  Blood pressure 154/89, pulse 104,  respiratory rate 18, O2 saturation 94% on room air.  GENERAL:  The patient has at rest continuous head turning toward the  left as well as occasional lateral bending toward the left as well as  some left shoulder elevation.  No upper extremity movement, no lower  extremity movement.  She does have intermittent forceful eye closure on  the left side only.  No lower facial twitching.   She has hypertonicity of the left scalene musculature.  She has her TENS  pads over the scalenes as well as the levator scapula area.  She has no  tenderness over the levator scapula at this time.  No tenderness over  the trapezius area.  She has good upper extremity strength in the  deltoid tricep grip.  Her gait shows no evidence of toe drag, knee  instability, or any evidence of ataxia.   IMPRESSION:  1. Cervical dystonia, still has marked fairly constant lateral collis      toward the left.  Fortunately her pain is under better control.  2. Gait disorder multifactorial, but improved after physical therapy.   PLAN:  1. We will continue hydrocodone one p.o. b.i.d.  2. Discussed treatment options in terms of injections.  I do not think      she needs a trigger point at this time.  We discussed Myobloc for      the scalenes and possible levator scapula and would use only low      dose.  It sounds like she had some swallowing difficulty after the      Botox injection.  I would divide in 2500 units for the Myobloc      between the scalenes and levator.  We discussed risks and benefits.   I will see her back in several weeks for this.      Erick Colace, M.D.  Electronically Signed     AEK/MedQ  D:  11/11/2007 09:38:18  T:  11/11/2007 10:53:06  Job #:  [id number]   cc:   Oley Balm. Georgina Pillion, M.D.  Fax: [phone]   Donia Guiles, M.D.  Fax: [phone]

## 2011-05-01 NOTE — Procedures (Signed)
CAROTID DUPLEX EXAM   INDICATION:  Carotid disease.   HISTORY:  Diabetes:  No.  Cardiac:  No.  Hypertension:  Yes.  Smoking:  Previous.  Previous Surgery:  No.  CV History:  Complaint of frequent falls without loss of consciousness.  Amaurosis Fugax No, Paresthesias No, Hemiparesis No                                       RIGHT             LEFT  Brachial systolic pressure:         166               164  Brachial Doppler waveforms:         Normal            Normal  Vertebral direction of flow:        Antegrade         Antegrade  DUPLEX VELOCITIES (cm/sec)  CCA peak systolic                   55                84  ECA peak systolic                   44                34  ICA peak systolic                   70                62  ICA end diastolic                   28                25  PLAQUE MORPHOLOGY:                  Heterogeneous     Heterogeneous  PLAQUE AMOUNT:                      Mild              Mild  PLAQUE LOCATION:                    ICA               ICA / CCA   IMPRESSION:  1. No hemodynamically significant stenosis of the bilateral internal      carotid arteries with mild plaque formations noted in the bilateral      proximal internal carotid arteries.  2. The right internal carotid artery demonstrates vessel tortuosity.  3. No significant change noted when compared to the previous exam on      06/30/2009.   ___________________________________________  Leonides Sake, MD   CH/MEDQ  D:  08/04/2010  T:  08/04/2010  Job:  [id number]

## 2011-05-01 NOTE — Procedures (Signed)
[name], [name]            ACCOUNT NO.:  [id number]   MEDICAL RECORD NO.:  [id number]          PATIENT TYPE:  REC   LOCATION:  [facility]                          FACILITY:  [facility]   PHYSICIAN:  Erick Colace, M.D.DATE OF BIRTH:  01-Jun-1946   DATE OF PROCEDURE:  11/11/2007  DATE OF DISCHARGE:  10/17/2007                               OPERATIVE REPORT   This is trigger point injection left levator scapular muscle.  Three  areas marked and prepped with Betadine, entered with a 25-gauge 1-1/2-  inch needle.  One mL of 1% lidocaine injected into each site with  trigger point deactivation.  The patient tolerated the procedure well.  Post-injection instructions given.      Erick Colace, M.D.  Electronically Signed     AEK/MEDQ  D:  11/11/2007 10:04:51  T:  11/11/2007 11:58:45  Job:  [id number]

## 2011-05-01 NOTE — Procedures (Signed)
NAMESUNJAI, LEVANDOSKI            ACCOUNT NO.:  [id number]   MEDICAL RECORD NO.:  [id number]          PATIENT TYPE:  REC   LOCATION:  [facility]                         FACILITY:  [facility]   PHYSICIAN:  Erick Colace, M.D.DATE OF BIRTH:  12-27-1945   DATE OF PROCEDURE:  DATE OF DISCHARGE:                               OPERATIVE REPORT   This is a trigger point injection, left levator scapula.  Three areas  marked and prepped with Betadine.  Entered with a 25-gauge 1-1/2-inch  needle.  1 mL of 1% lidocaine and injected into each site, with trigger  point deactivation.  The patient tolerated the procedure well.  Post-  injection instructions given.  Return in 2 weeks for possible  reinjection.  I will also follow up on therapy and follow up on TENS  trial.      Erick Colace, M.D.  Electronically Signed     AEK/MEDQ  D:  09/29/2007 14:40:40  T:  09/30/2007 09:37:42  Job:  [id number]

## 2011-05-01 NOTE — Procedures (Signed)
CAROTID DUPLEX EXAM   INDICATION:  Carotid disease.   HISTORY:  Diabetes:  No  Cardiac:  No  Hypertension:  Yes  Smoking:  Previous  Previous Surgery:  No  CV History:  Patient states she has had dizziness with falls.  Amaurosis Fugax No, Paresthesias No, Hemiparesis No                                       RIGHT             LEFT  Brachial systolic pressure:         118               120  Brachial Doppler waveforms:         normal            normal  Vertebral direction of flow:        antegrade         antegrade  DUPLEX VELOCITIES (cm/sec)  CCA peak systolic                   74                72  ECA peak systolic                   70                65  ICA peak systolic                   73                60  ICA end diastolic                   27                24  PLAQUE MORPHOLOGY:                  heterogeneous     heterogeneous  PLAQUE AMOUNT:                      mild              mild  PLAQUE LOCATION:                    ICA               ICA/bifurcation   IMPRESSION:  A 1-39% stenosis of the bilateral internal carotid  arteries.   ___________________________________________  P. Liliane Bade, M.D.   CH/MEDQ  D:  06/30/2009  T:  06/30/2009  Job:  [id number]

## 2011-05-04 NOTE — Op Note (Signed)
NAME:  [name], [name]                      ACCOUNT NO.:  [id number]   MEDICAL RECORD NO.:  [id number]                   PATIENT TYPE:  AMB   LOCATION:  DSC                                  FACILITY:  [facility]   PHYSICIAN:  Cindee Salt, M.D.                    DATE OF BIRTH:  [birth year]   DATE OF PROCEDURE:  05/09/2004  DATE OF DISCHARGE:                                 OPERATIVE REPORT   PREOPERATIVE DIAGNOSIS:  Fracture, proximal phalanx, left little finger.   POSTOPERATIVE DIAGNOSIS:  Fracture, proximal phalanx, left little finger.   OPERATION:  Open reduction and internal fixation.   HISTORY:  The patient is a 65 year old female who suffered a fracture of her  left little finger approximately three weeks ago.  She did not seek  treatment with [place] until last week.  X-rays revealed callus formation.  She  has a short oblique rotated fracture with overlap of her ring finger.   PROCEDURE:  The patient was brought to the operating room, a general  anesthetic carried out with out difficulty.  She was prepped using Duraprep  in the supine position, left arm free.  The limb was exsanguinated with an  Esmarch bandage, a tourniquet placed on the forearm was inflated to 225  mmHg.  A curvilinear incision was made over the middle and proximal phalanx,  carried down through subcutaneous tissue.  Bleeders were electrocauterized.  The extensor tendon was split longitudinally.  A significant enlargement of  the periosteum was noted, and this was incised and elevated off and the bone  callus was removed.  The fracture was then apparent.  This was able to be  mobilized.  The excess callus was taken out, the fracture was reduced,  pinned with two 0.028 K-wires, one from radially and one from laterally, by  placing these under the extensor tendon.  These were then clamped.  X-rays  then confirmed that the fracture was reduced, AP and lateral direction.  There was no angulation or overlap with  flexion of the fingers.  The pin was  removed with a clamp in place, the pin site drilled with a 1.1 mm drill.  This measured 9 mm, and a 1.5 mm and 9 mm screw was then placed, firmly  fixing this in position.  The second K-wire was removed, drilled, and a  second 9 mm 1.5 mm screw placed.  This firmly fixed the fracture in  position.  Both AP and lateral x-rays on the [facility] revealed it reduced in  position.  The wound was irrigated, the periosteum closed with a running 6-0  chromic suture, the extensor tendon closed with a running 5-0 Mersilene  suture, and the skin with interrupted 5-0 nylon suture.  A sterile  compressive dressing and splint were applied.  The patient tolerated the  procedure well and was taken to the recovery room for observation in  satisfactory condition.  She is discharged home to return to the [facility]  [facility] [place] in one week on Vicodin and Keflex.                                               Cindee Salt, M.D.    Angelique Blonder  D:  05/09/2004  T:  05/10/2004  Job:  [id number]

## 2011-05-04 NOTE — Procedures (Signed)
[facility]  Patient:    [name], [name] Visit Number: [id number] MRN: [id number]          Service Type: END Location: ENDO Attending Physician:  Louie Bun Dictated by:   Everardo All Madilyn Fireman, M.D. Proc. Date: 06/08/02 Admit Date:  06/08/2002   CC:         Desma Maxim, M.D.   Procedure Report  PROCEDURE:  Colonoscopy.  INDICATIONS FOR PROCEDURE:  Screening colonoscopy in a 65 year old patient.  DESCRIPTION OF PROCEDURE:  The patient was placed in the left lateral decubitus position then placed on the pulse monitor with continuous low flow oxygen delivered by nasal cannula. She was sedated with 75 mg IV Demerol and 7.5 mg IV Versed. The Olympus video colonoscope was inserted into the rectum and advanced to the cecum, confirmed by transillumination at McBurneys point and visualization at the ileocecal valve and appendiceal orifice. The prep was excellent. The cecum, ascending, transverse, descending, sigmoid and rectum all appeared normal with no masses, polyps, diverticula or other mucosal abnormalities. The scope was then withdrawn and the patient returned to the recovery room in stable condition. The patient tolerated the procedure well and there were no immediate complications.  IMPRESSION:  Normal colonoscopy.  PLAN:  Colon screening by flexible sigmoidoscopy in five years. Dictated by:   Everardo All Madilyn Fireman, M.D. Attending Physician:  Louie Bun DD:  06/08/02 TD:  06/08/02 Job: [id number]

## 2011-05-04 NOTE — Procedures (Signed)
[facility]  Patient:    [name], [name]                     MRN: [id number] Proc. Date: 02/24/01 Adm. Date:  [id number] Attending:  Louie Bun CC:         Desma Maxim, M.D.   Procedure Report  PROCEDURE:  Esophagogastroduodenoscopy.  ENDOSCOPIST:  Everardo All. Madilyn Fireman, M.D.  INDICATIONS FOR PROCEDURE: Atypical chest pain with negative cardiac workup and previous positive 24-hour PA study in the remote past.  Procedure is to better assess likelihood of chest pain being esophageal or due to reflux and guiding further therapy.  DESCRIPTION OF PROCEDURE:   The patient was placed in the left lateral decubitus position and placed on the pulse monitor with continuous low-flow oxygen delivered by nasal cannula.  She was sedated with 50 mg IV Demerol and 5 mg IV Versed.  The Olympus video endoscope was advanced under direct vision into the oropharynx and esophagus.  The esophagus was straight and of normal caliber.  The squamocolumnar line sharply identified at 38 cm. There was at most a 1 to 0.5 cm hiatal hernia distal to the Z-line.  There was no stricture, esophagitis, ring, or other abnormality noted.  The stomach was entered, and a small amount of liquid secretion was suctioned from the fundus. Retroflexed view of the cardia was unremarkable.  The fundus, body, antrum, and pylorus all appeared normal.  The duodenum was entered and both bulbs, first and second portion were well inspected and appeared to be within normal limits.  The endoscope was then withdrawn back into the stomach and CLOtest obtained.  The scope was then withdrawn, and the patient returned to the recovery room in stable condition.  She tolerated the procedure well, and there were no immediate complications.  IMPRESSION:  Minimal hiatal hernia, otherwise normal endoscopy.  PLAN:  Await CLOtest and treat for eradication of Helicobacter if positive. If negative, will treat  with an antispasmodic for presumed esophageal spasm. DD:  02/24/01 TD:  02/24/01 Job: [id number]

## 2011-05-07 ENCOUNTER — Encounter: Payer: 59 | Attending: Physical Medicine & Rehabilitation

## 2011-05-07 ENCOUNTER — Ambulatory Visit (HOSPITAL_BASED_OUTPATIENT_CLINIC_OR_DEPARTMENT_OTHER): Payer: 59 | Admitting: Physical Medicine & Rehabilitation

## 2011-05-07 DIAGNOSIS — G245 Blepharospasm: Secondary | ICD-10-CM

## 2011-05-07 DIAGNOSIS — G243 Spasmodic torticollis: Secondary | ICD-10-CM

## 2011-05-07 NOTE — Procedures (Signed)
NAMEPEIGHTYN, ROBERSON            ACCOUNT NO.:  [id number]  MEDICAL RECORD NO.:  [id number]           PATIENT TYPE:  O  LOCATION:  TPC                          FACILITY:  [facility]  PHYSICIAN:  Erick Colace, M.D.DATE OF BIRTH:  Sep 27, 1946  DATE OF PROCEDURE: DATE OF DISCHARGE:                              OPERATIVE REPORT  PROCEDURE:  Mild block injection, left orbicularis auris, left pretarsal, left multiple cervical see below.  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  [id number] modifier, dilution is 500 units per 0.1 mL.  Informed consent was obtained after describing risks and benefits of the procedure. These include bleeding, bruising, infection, as well as toxin-associated problems such as dry mouth or dysphagia.  She has gone through REMS form and signed off on this.  Has been here during consent process.  Similar procedure performed with good results on February 08, 2011.  Injection is starting to wear off.  The patient placed in seated position one area of the left pretarsal was injected.  The pretarsal injection was performed using a 30-gauge 1/2- inch needle with 250 units of mild block injected.  Orbicularis oculi on the left was injected using the same needle.  After chlorhexidine prep and alcohol, 500 units were injected.  Then two sites along the left trapezius, one side splenius cervicis, one side levator scapula, three sites of sternocleidomastoid, and two sites anterior, posterior, and middle scalene were each injected with 500 units of mild block after being prepped with chlorhexidine.  Procedure was performed using a 30- gauge 1-inch needle electrode under needle EMG guidance.  Postprocedure instructions given.  Of note is that the longissimus was injected with 1000 units as an exception.  The patient tolerated procedure well.  Postprocedure instructions given, written repeat in 3 months given her  previous effect pattern.     Erick Colace, M.D. Electronically Signed    AEK/MEDQ  D:  05/07/2011 11:57:25  T:  05/07/2011 23:19:23  Job:  [id number]  cc:   Kari Baars, M.D. Fax: [phone]

## 2011-07-05 ENCOUNTER — Other Ambulatory Visit: Payer: Self-pay | Admitting: Gastroenterology

## 2011-07-05 DIAGNOSIS — R1013 Epigastric pain: Secondary | ICD-10-CM

## 2011-07-13 ENCOUNTER — Ambulatory Visit
Admission: RE | Admit: 2011-07-13 | Discharge: 2011-07-13 | Disposition: A | Discharge status: Home / Self Care | Payer: 59 | Source: Ambulatory Visit | Attending: Gastroenterology | Admitting: Gastroenterology

## 2011-07-13 DIAGNOSIS — R1013 Epigastric pain: Secondary | ICD-10-CM

## 2011-07-25 ENCOUNTER — Other Ambulatory Visit: Payer: Self-pay | Admitting: Family Medicine

## 2011-07-25 DIAGNOSIS — Z1231 Encounter for screening mammogram for malignant neoplasm of breast: Secondary | ICD-10-CM

## 2011-07-31 ENCOUNTER — Ambulatory Visit (HOSPITAL_BASED_OUTPATIENT_CLINIC_OR_DEPARTMENT_OTHER): Payer: 59 | Admitting: Physical Medicine & Rehabilitation

## 2011-07-31 ENCOUNTER — Encounter: Payer: 59 | Attending: Physical Medicine & Rehabilitation

## 2011-07-31 DIAGNOSIS — G243 Spasmodic torticollis: Secondary | ICD-10-CM | POA: Insufficient documentation

## 2011-07-31 DIAGNOSIS — G245 Blepharospasm: Secondary | ICD-10-CM | POA: Insufficient documentation

## 2011-07-31 NOTE — Procedures (Signed)
[name], [name]            ACCOUNT NO.:  [id number]  MEDICAL RECORD NO.:  [id number]           PATIENT TYPE:  O  LOCATION:  TPC                          FACILITY:  [facility]  PHYSICIAN:  Erick Colace, M.D.DATE OF BIRTH:  June 17, 1946  DATE OF PROCEDURE: DATE OF DISCHARGE:                              OPERATIVE REPORT  PROCEDURE:  Myobloc injection for cervical dystonia.  INDICATIONS:  Cervical dystonia of left lower spasms.  54098 are the codes and 11914.  95874 - 25 modifier.  Informed consent was obtained after describing risks and benefits of the procedure with the patient.  These include bleeding, bruising, infection as well as toxin-associated problems such as dry mouth or dysphagia.  She has gone through REMS form and signed off on this.  Last procedure May 07, 2011.  The patient placed in seated position.  Left pretarsal area was injected, pretarsal injection using 30-gauge 1-inch Myobloc needle, 250 units of Myobloc injected. Orbicularis loculi on the left was injected using the same needle after Betadine prep and alcohol prep, 500 units injected.  Two sites one on the left trapezius.  Once I had the left splenius capitis cervicis one area at left levator scapula and 3 areas left sternocleidomastoid.  Two areas each in the anterior and posterior scalenes all after appropriate needle EMG guidance.  The patient tolerated the procedure well.  Postprocedure instructions given.  Of note is that the scalene muscles were not as active as prior, more activity seen at the splenius cervicis this may consider increasing dose at that muscle and reducing at the scalenes.     Erick Colace, M.D. Electronically Signed    AEK/MEDQ  D:  07/31/2011 12:37:12  T:  07/31/2011 21:10:39  Job:  [id number]  cc:   Kari Baars, M.D. Fax: [phone]

## 2011-08-03 ENCOUNTER — Ambulatory Visit
Admission: RE | Admit: 2011-08-03 | Discharge: 2011-08-03 | Disposition: A | Discharge status: Home / Self Care | Payer: 59 | Source: Ambulatory Visit | Attending: Family Medicine | Admitting: Family Medicine

## 2011-08-03 DIAGNOSIS — Z1231 Encounter for screening mammogram for malignant neoplasm of breast: Secondary | ICD-10-CM

## 2011-10-30 ENCOUNTER — Ambulatory Visit: Payer: 59 | Admitting: Physical Medicine & Rehabilitation

## 2011-11-13 ENCOUNTER — Ambulatory Visit (HOSPITAL_BASED_OUTPATIENT_CLINIC_OR_DEPARTMENT_OTHER): Payer: 59 | Admitting: Physical Medicine & Rehabilitation

## 2011-11-13 ENCOUNTER — Encounter: Payer: 59 | Attending: Physical Medicine & Rehabilitation

## 2011-11-13 DIAGNOSIS — G245 Blepharospasm: Secondary | ICD-10-CM | POA: Insufficient documentation

## 2011-11-13 DIAGNOSIS — G243 Spasmodic torticollis: Secondary | ICD-10-CM | POA: Insufficient documentation

## 2011-11-14 NOTE — Procedures (Signed)
[name], [name]            ACCOUNT NO.:  [id number]  MEDICAL RECORD NO.:  [id number]           PATIENT TYPE:  O  LOCATION:  TPC                          FACILITY:  [facility]  PHYSICIAN:  Erick Colace, M.D.DATE OF BIRTH:  22-Jun-1946  DATE OF PROCEDURE: DATE OF DISCHARGE:                              OPERATIVE REPORT  MYOBLOC INJECTION  INDICATION:  Left lower spasm 333.81 and cervical dystonia 333.83.  PROCEDURES PERFORMED:  Myobloc injection of facial muscle 16109 and the Myobloc injection of cervical muscle 60454.  INDICATIONS:  Cervical dystonia and left lower spasm.  Her eye control movements are not relieved by medication, management have improved for 3- 4 months with repeated Myobloc injection.  Total units injection 7500 units of Myobloc.  Dilution is 25 units/0.5 mL.  Informed consent was obtained after describing risks and benefits of the procedure with the patient.  These include bleeding, bruising, and infection.  She elected to proceed and has given written consent.  The patient placed in a seated position, somewhat reclined.  Areas over the pretarsal area, orbicularis oculi, 3-4 areas of left sternocleidomastoid, 2 areas of left scalenus anticus, 2 area of left scalenus medius, 2 areas of left splenius capitis, 2 areas of left trapezius.  We will monitor Betadine 0.1 mL injected into each site after negative drawback for blood using a 30-gauge 1-inch needle electrode under EMG guide.  Only exception was pretarsal area 0.05 mL injected into that one.  The patient tolerated the procedure well. Postprocedure instructions given.  Next procedure will be in 4 months.     Erick Colace, M.D. Electronically Signed    AEK/MEDQ  D:  11/13/2011 10:17:15  T:  11/13/2011 12:05:58  Job:  [id number]

## 2011-12-26 DIAGNOSIS — IMO0002 Reserved for concepts with insufficient information to code with codable children: Secondary | ICD-10-CM | POA: Diagnosis not present

## 2011-12-26 DIAGNOSIS — M1710 Unilateral primary osteoarthritis, unspecified knee: Secondary | ICD-10-CM | POA: Diagnosis not present

## 2011-12-31 DIAGNOSIS — M25519 Pain in unspecified shoulder: Secondary | ICD-10-CM | POA: Diagnosis not present

## 2011-12-31 DIAGNOSIS — S43499A Other sprain of unspecified shoulder joint, initial encounter: Secondary | ICD-10-CM | POA: Diagnosis not present

## 2012-01-21 ENCOUNTER — Other Ambulatory Visit: Payer: Self-pay | Admitting: Sports Medicine

## 2012-01-21 DIAGNOSIS — M25511 Pain in right shoulder: Secondary | ICD-10-CM

## 2012-01-21 DIAGNOSIS — M25519 Pain in unspecified shoulder: Secondary | ICD-10-CM | POA: Diagnosis not present

## 2012-01-29 ENCOUNTER — Other Ambulatory Visit: Payer: Self-pay | Admitting: Cardiology

## 2012-01-29 ENCOUNTER — Encounter: Payer: Medicare Other | Attending: Physical Medicine & Rehabilitation

## 2012-01-29 ENCOUNTER — Ambulatory Visit
Admission: RE | Admit: 2012-01-29 | Discharge: 2012-01-29 | Disposition: A | Discharge status: Home / Self Care | Payer: Medicare Other | Source: Ambulatory Visit | Attending: Sports Medicine | Admitting: Sports Medicine

## 2012-01-29 DIAGNOSIS — G245 Blepharospasm: Secondary | ICD-10-CM | POA: Insufficient documentation

## 2012-01-29 DIAGNOSIS — M25511 Pain in right shoulder: Secondary | ICD-10-CM

## 2012-01-29 DIAGNOSIS — G243 Spasmodic torticollis: Secondary | ICD-10-CM | POA: Insufficient documentation

## 2012-01-29 DIAGNOSIS — G893 Neoplasm related pain (acute) (chronic): Secondary | ICD-10-CM | POA: Diagnosis not present

## 2012-01-29 DIAGNOSIS — M5382 Other specified dorsopathies, cervical region: Secondary | ICD-10-CM | POA: Diagnosis not present

## 2012-01-29 DIAGNOSIS — S43499A Other sprain of unspecified shoulder joint, initial encounter: Secondary | ICD-10-CM | POA: Diagnosis not present

## 2012-01-29 DIAGNOSIS — M67919 Unspecified disorder of synovium and tendon, unspecified shoulder: Secondary | ICD-10-CM | POA: Diagnosis not present

## 2012-01-29 DIAGNOSIS — S46819A Strain of other muscles, fascia and tendons at shoulder and upper arm level, unspecified arm, initial encounter: Secondary | ICD-10-CM | POA: Diagnosis not present

## 2012-01-29 DIAGNOSIS — M719 Bursopathy, unspecified: Secondary | ICD-10-CM | POA: Diagnosis not present

## 2012-01-29 DIAGNOSIS — M542 Cervicalgia: Secondary | ICD-10-CM | POA: Diagnosis not present

## 2012-02-05 DIAGNOSIS — S46819A Strain of other muscles, fascia and tendons at shoulder and upper arm level, unspecified arm, initial encounter: Secondary | ICD-10-CM | POA: Diagnosis not present

## 2012-02-05 DIAGNOSIS — S43499A Other sprain of unspecified shoulder joint, initial encounter: Secondary | ICD-10-CM | POA: Diagnosis not present

## 2012-02-05 DIAGNOSIS — M25519 Pain in unspecified shoulder: Secondary | ICD-10-CM | POA: Diagnosis not present

## 2012-02-22 DIAGNOSIS — M25519 Pain in unspecified shoulder: Secondary | ICD-10-CM | POA: Diagnosis not present

## 2012-02-29 ENCOUNTER — Other Ambulatory Visit: Payer: Self-pay | Admitting: *Deleted

## 2012-02-29 MED ORDER — HYDROCODONE-ACETAMINOPHEN 5-500 MG PO TABS: 1.0000 | ORAL_TABLET | Freq: Two times a day (BID) | ORAL | Status: DC

## 2012-03-04 DIAGNOSIS — Z9981 Dependence on supplemental oxygen: Secondary | ICD-10-CM | POA: Diagnosis not present

## 2012-03-04 DIAGNOSIS — G4733 Obstructive sleep apnea (adult) (pediatric): Secondary | ICD-10-CM | POA: Diagnosis not present

## 2012-03-06 DIAGNOSIS — S43429A Sprain of unspecified rotator cuff capsule, initial encounter: Secondary | ICD-10-CM | POA: Diagnosis not present

## 2012-03-06 DIAGNOSIS — Y929 Unspecified place or not applicable: Secondary | ICD-10-CM | POA: Diagnosis not present

## 2012-03-06 DIAGNOSIS — X58XXXA Exposure to other specified factors, initial encounter: Secondary | ICD-10-CM | POA: Diagnosis not present

## 2012-03-06 DIAGNOSIS — S46819A Strain of other muscles, fascia and tendons at shoulder and upper arm level, unspecified arm, initial encounter: Secondary | ICD-10-CM | POA: Diagnosis not present

## 2012-03-06 DIAGNOSIS — S43499A Other sprain of unspecified shoulder joint, initial encounter: Secondary | ICD-10-CM | POA: Diagnosis not present

## 2012-03-06 DIAGNOSIS — M19019 Primary osteoarthritis, unspecified shoulder: Secondary | ICD-10-CM | POA: Diagnosis not present

## 2012-03-10 ENCOUNTER — Telehealth: Payer: Self-pay | Admitting: Radiology

## 2012-03-10 NOTE — Telephone Encounter (Signed)
Had rotator cuff surgery last week and is on different pain killer and muscle relaxant.  Will be in for Myobloc tomorrow.

## 2012-03-10 NOTE — Telephone Encounter (Signed)
Ok no prob

## 2012-03-11 ENCOUNTER — Ambulatory Visit (HOSPITAL_BASED_OUTPATIENT_CLINIC_OR_DEPARTMENT_OTHER): Payer: Medicare Other | Admitting: Physical Medicine & Rehabilitation

## 2012-03-11 ENCOUNTER — Encounter: Payer: Self-pay | Admitting: Physical Medicine & Rehabilitation

## 2012-03-11 ENCOUNTER — Encounter: Payer: Medicare Other | Attending: Physical Medicine & Rehabilitation

## 2012-03-11 VITALS — BP 139/82 | HR 87 | Resp 16 | Ht 64.0 in | Wt 182.0 lb

## 2012-03-11 DIAGNOSIS — G243 Spasmodic torticollis: Secondary | ICD-10-CM | POA: Insufficient documentation

## 2012-03-11 DIAGNOSIS — G245 Blepharospasm: Secondary | ICD-10-CM | POA: Diagnosis not present

## 2012-03-11 NOTE — Progress Notes (Signed)
Informed consent was obtained after describing risks and benefits of the  procedure with the patient. These include bleeding, bruising, and  infection. She elected to proceed and has given written consent. The  patient placed in a seated position, somewhat reclined. Areas over the  pretarsal area, orbicularis oculi, 3-4 areas of left  sternocleidomastoid, 2 areas of left scalenus anticus, 2 area of left  scalenus medius, 2 areas of left splenius capitis, 2 areas of left  trapezius. We will monitor Betadine 0.1 mL injected into each site  after negative drawback for blood using a 30-gauge 1-inch needle  electrode under EMG guide. Only exception was pretarsal area 0.05 mL  injected into that one. The patient tolerated the procedure well.  Postprocedure instructions given. Next procedure will be in 4 months.

## 2012-03-11 NOTE — Patient Instructions (Signed)
Botox Cosmetic Injections  Care After  Refer to this sheet in the next few weeks. These instructions provide you with information on caring for yourself after your procedure. Your caregiver may also give you more specific instructions. Your treatment has been planned according to current medical practices, but problems sometimes occur. Call your caregiver if you have any problems or questions after your procedure.  HOME CARE INSTRUCTIONS     Do not lie down for 4 hours after treatment.   Do not massage the treated muscles. This can cause the Botox to spread to the muscles around the eyes, which can lead to double vision.   Exercise the muscles every 15 minutes for 1 hour after treatment or as directed by your caregiver. Botox attaches better to active muscles.   You may put ice on the area where the shot was given.   Put ice in a plastic bag.   Place a towel between your skin and the bag.  SEEK IMMEDIATE MEDICAL CARE IF:     You develop double vision.   You start to have trouble swallowing.   You develop muscle weakness in other parts of your body.   Your pupils start to become dilated and sensitive to light.  MAKE SURE YOU:   Understand these instructions.   Will watch your condition.   Will get help right away if you are not doing well or get worse.  Document Released: 05/07/2011 Document Revised: 11/22/2011 Document Reviewed: 05/07/2011  Exit[employer] Patient Information 2012 [employer].

## 2012-03-12 ENCOUNTER — Encounter: Payer: Self-pay | Admitting: Physical Medicine & Rehabilitation

## 2012-03-12 DIAGNOSIS — M25519 Pain in unspecified shoulder: Secondary | ICD-10-CM | POA: Diagnosis not present

## 2012-03-17 DIAGNOSIS — M25519 Pain in unspecified shoulder: Secondary | ICD-10-CM | POA: Diagnosis not present

## 2012-03-18 DIAGNOSIS — G4733 Obstructive sleep apnea (adult) (pediatric): Secondary | ICD-10-CM | POA: Diagnosis not present

## 2012-03-19 DIAGNOSIS — M25519 Pain in unspecified shoulder: Secondary | ICD-10-CM | POA: Diagnosis not present

## 2012-03-24 DIAGNOSIS — M25519 Pain in unspecified shoulder: Secondary | ICD-10-CM | POA: Diagnosis not present

## 2012-03-27 DIAGNOSIS — M25519 Pain in unspecified shoulder: Secondary | ICD-10-CM | POA: Diagnosis not present

## 2012-04-01 ENCOUNTER — Other Ambulatory Visit: Payer: Self-pay | Admitting: *Deleted

## 2012-04-01 DIAGNOSIS — M25519 Pain in unspecified shoulder: Secondary | ICD-10-CM | POA: Diagnosis not present

## 2012-04-01 MED ORDER — HYDROCODONE-ACETAMINOPHEN 5-500 MG PO TABS: 1.0000 | ORAL_TABLET | Freq: Two times a day (BID) | ORAL | Status: DC

## 2012-04-02 DIAGNOSIS — M25519 Pain in unspecified shoulder: Secondary | ICD-10-CM | POA: Diagnosis not present

## 2012-04-08 DIAGNOSIS — M25519 Pain in unspecified shoulder: Secondary | ICD-10-CM | POA: Diagnosis not present

## 2012-04-10 DIAGNOSIS — M25519 Pain in unspecified shoulder: Secondary | ICD-10-CM | POA: Diagnosis not present

## 2012-04-14 DIAGNOSIS — I6529 Occlusion and stenosis of unspecified carotid artery: Secondary | ICD-10-CM | POA: Diagnosis not present

## 2012-04-14 DIAGNOSIS — Z1211 Encounter for screening for malignant neoplasm of colon: Secondary | ICD-10-CM | POA: Diagnosis not present

## 2012-04-14 DIAGNOSIS — G4733 Obstructive sleep apnea (adult) (pediatric): Secondary | ICD-10-CM | POA: Diagnosis not present

## 2012-04-14 DIAGNOSIS — Z0000 Encounter for general adult medical examination without abnormal findings: Secondary | ICD-10-CM | POA: Diagnosis not present

## 2012-04-14 DIAGNOSIS — Z23 Encounter for immunization: Secondary | ICD-10-CM | POA: Diagnosis not present

## 2012-04-14 DIAGNOSIS — E782 Mixed hyperlipidemia: Secondary | ICD-10-CM | POA: Diagnosis not present

## 2012-04-14 DIAGNOSIS — IMO0001 Reserved for inherently not codable concepts without codable children: Secondary | ICD-10-CM | POA: Diagnosis not present

## 2012-04-14 DIAGNOSIS — I10 Essential (primary) hypertension: Secondary | ICD-10-CM | POA: Diagnosis not present

## 2012-04-14 DIAGNOSIS — R7301 Impaired fasting glucose: Secondary | ICD-10-CM | POA: Diagnosis not present

## 2012-04-15 DIAGNOSIS — M25519 Pain in unspecified shoulder: Secondary | ICD-10-CM | POA: Diagnosis not present

## 2012-04-17 DIAGNOSIS — M25519 Pain in unspecified shoulder: Secondary | ICD-10-CM | POA: Diagnosis not present

## 2012-04-22 DIAGNOSIS — M25519 Pain in unspecified shoulder: Secondary | ICD-10-CM | POA: Diagnosis not present

## 2012-04-29 ENCOUNTER — Other Ambulatory Visit: Payer: Self-pay | Admitting: *Deleted

## 2012-04-29 DIAGNOSIS — M25519 Pain in unspecified shoulder: Secondary | ICD-10-CM | POA: Diagnosis not present

## 2012-04-29 DIAGNOSIS — Z13820 Encounter for screening for osteoporosis: Secondary | ICD-10-CM | POA: Diagnosis not present

## 2012-04-29 DIAGNOSIS — F321 Major depressive disorder, single episode, moderate: Secondary | ICD-10-CM | POA: Diagnosis not present

## 2012-04-29 DIAGNOSIS — N951 Menopausal and female climacteric states: Secondary | ICD-10-CM | POA: Diagnosis not present

## 2012-04-29 MED ORDER — CLONAZEPAM 2 MG PO TABS: 2.0000 mg | ORAL_TABLET | Freq: Two times a day (BID) | ORAL | Status: DC

## 2012-04-30 ENCOUNTER — Other Ambulatory Visit: Payer: Self-pay

## 2012-04-30 MED ORDER — HYDROCODONE-ACETAMINOPHEN 5-500 MG PO TABS: 1.0000 | ORAL_TABLET | Freq: Two times a day (BID) | ORAL | Status: DC

## 2012-05-02 DIAGNOSIS — Z1211 Encounter for screening for malignant neoplasm of colon: Secondary | ICD-10-CM | POA: Diagnosis not present

## 2012-05-07 DIAGNOSIS — I6529 Occlusion and stenosis of unspecified carotid artery: Secondary | ICD-10-CM | POA: Diagnosis not present

## 2012-05-13 DIAGNOSIS — M25519 Pain in unspecified shoulder: Secondary | ICD-10-CM | POA: Diagnosis not present

## 2012-05-26 DIAGNOSIS — F321 Major depressive disorder, single episode, moderate: Secondary | ICD-10-CM | POA: Diagnosis not present

## 2012-05-27 DIAGNOSIS — M25519 Pain in unspecified shoulder: Secondary | ICD-10-CM | POA: Diagnosis not present

## 2012-06-03 DIAGNOSIS — M25519 Pain in unspecified shoulder: Secondary | ICD-10-CM | POA: Diagnosis not present

## 2012-06-05 ENCOUNTER — Other Ambulatory Visit: Payer: Self-pay | Admitting: *Deleted

## 2012-06-05 MED ORDER — HYDROCODONE-ACETAMINOPHEN 5-500 MG PO TABS: 1.0000 | ORAL_TABLET | Freq: Two times a day (BID) | ORAL | Status: DC

## 2012-06-05 MED ORDER — CLONAZEPAM 2 MG PO TABS: 2.0000 mg | ORAL_TABLET | Freq: Two times a day (BID) | ORAL | Status: DC

## 2012-06-10 DIAGNOSIS — F321 Major depressive disorder, single episode, moderate: Secondary | ICD-10-CM | POA: Diagnosis not present

## 2012-06-24 DIAGNOSIS — F321 Major depressive disorder, single episode, moderate: Secondary | ICD-10-CM | POA: Diagnosis not present

## 2012-07-08 DIAGNOSIS — F321 Major depressive disorder, single episode, moderate: Secondary | ICD-10-CM | POA: Diagnosis not present

## 2012-07-11 ENCOUNTER — Encounter: Payer: Medicare Other | Attending: Physical Medicine & Rehabilitation

## 2012-07-11 ENCOUNTER — Encounter: Payer: Self-pay | Admitting: Physical Medicine & Rehabilitation

## 2012-07-11 ENCOUNTER — Ambulatory Visit (HOSPITAL_BASED_OUTPATIENT_CLINIC_OR_DEPARTMENT_OTHER): Payer: Medicare Other | Admitting: Physical Medicine & Rehabilitation

## 2012-07-11 VITALS — BP 133/77 | HR 86 | Resp 16 | Ht 64.0 in | Wt 187.0 lb

## 2012-07-11 DIAGNOSIS — G243 Spasmodic torticollis: Secondary | ICD-10-CM | POA: Insufficient documentation

## 2012-07-11 DIAGNOSIS — G245 Blepharospasm: Secondary | ICD-10-CM

## 2012-07-11 MED ORDER — HYDROCODONE-ACETAMINOPHEN 5-500 MG PO TABS: 1.0000 | ORAL_TABLET | Freq: Two times a day (BID) | ORAL | Status: DC | PRN

## 2012-07-11 MED ORDER — CLONAZEPAM 2 MG PO TABS: 2.0000 mg | ORAL_TABLET | Freq: Two times a day (BID) | ORAL | Status: DC | PRN

## 2012-07-11 NOTE — Progress Notes (Signed)
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 A total of 7500 units used. Needles 30-gauge 4 pretarsal and orbicularis oculi 27-gauge needle electrode under EMG guidance for remainder of muscles 500 units into left pretarsal 500 units into left orbicularis oculi 1500 units into the left sternocleidomastoid 1000 units left scal anticus 500 units left scal medius 2500 units left splenius capitis 1000 units left trapezius  Patient tolerated procedure well Plan for next visit reduce dosage to 5000 units Inject pretarsal 500 units Orbicularis oculi 500 units Sternocleidomastoid 1500 units Splenius capitis 2500 units

## 2012-07-11 NOTE — Patient Instructions (Signed)
Next visit we will reduce the Myobloc dose to 5000 units. This is because of improved disease activity. There is less motor activity seen on your EMG today. We will not need to inject the scalene muscles. I'll see back in 4 months

## 2012-08-19 ENCOUNTER — Other Ambulatory Visit: Payer: Self-pay | Admitting: *Deleted

## 2012-08-19 MED ORDER — CLONAZEPAM 2 MG PO TABS: 2.0000 mg | ORAL_TABLET | Freq: Two times a day (BID) | ORAL | Status: DC | PRN

## 2012-08-19 MED ORDER — HYDROCODONE-ACETAMINOPHEN 5-500 MG PO TABS: 1.0000 | ORAL_TABLET | Freq: Two times a day (BID) | ORAL | Status: DC | PRN

## 2012-09-05 DIAGNOSIS — Z23 Encounter for immunization: Secondary | ICD-10-CM | POA: Diagnosis not present

## 2012-09-21 ENCOUNTER — Other Ambulatory Visit: Payer: Self-pay | Admitting: Physical Medicine & Rehabilitation

## 2012-10-14 DIAGNOSIS — IMO0001 Reserved for inherently not codable concepts without codable children: Secondary | ICD-10-CM | POA: Diagnosis not present

## 2012-10-14 DIAGNOSIS — E669 Obesity, unspecified: Secondary | ICD-10-CM | POA: Diagnosis not present

## 2012-10-14 DIAGNOSIS — I10 Essential (primary) hypertension: Secondary | ICD-10-CM | POA: Diagnosis not present

## 2012-10-14 DIAGNOSIS — F329 Major depressive disorder, single episode, unspecified: Secondary | ICD-10-CM | POA: Diagnosis not present

## 2012-10-14 DIAGNOSIS — I6529 Occlusion and stenosis of unspecified carotid artery: Secondary | ICD-10-CM | POA: Diagnosis not present

## 2012-10-14 DIAGNOSIS — E782 Mixed hyperlipidemia: Secondary | ICD-10-CM | POA: Diagnosis not present

## 2012-10-14 DIAGNOSIS — R7301 Impaired fasting glucose: Secondary | ICD-10-CM | POA: Diagnosis not present

## 2012-10-14 DIAGNOSIS — L659 Nonscarring hair loss, unspecified: Secondary | ICD-10-CM | POA: Diagnosis not present

## 2012-10-27 ENCOUNTER — Other Ambulatory Visit: Payer: Self-pay | Admitting: Physical Medicine & Rehabilitation

## 2012-11-11 ENCOUNTER — Encounter: Payer: Medicare Other | Attending: Physical Medicine & Rehabilitation

## 2012-11-11 ENCOUNTER — Encounter: Payer: Self-pay | Admitting: Physical Medicine & Rehabilitation

## 2012-11-11 ENCOUNTER — Ambulatory Visit (HOSPITAL_BASED_OUTPATIENT_CLINIC_OR_DEPARTMENT_OTHER): Payer: Medicare Other | Admitting: Physical Medicine & Rehabilitation

## 2012-11-11 VITALS — BP 108/66 | HR 69 | Resp 14 | Ht 64.0 in | Wt 190.0 lb

## 2012-11-11 DIAGNOSIS — G245 Blepharospasm: Secondary | ICD-10-CM

## 2012-11-11 DIAGNOSIS — G243 Spasmodic torticollis: Secondary | ICD-10-CM | POA: Diagnosis not present

## 2012-11-11 NOTE — Progress Notes (Signed)
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    Patient tolerated procedure well Plan for next visit reduce dosage to 5000 units Inject pretarsal 500 units Orbicularis oculi 500 units Sternocleidomastoid 1500 units Splenius capitis 2500 units

## 2012-11-11 NOTE — Patient Instructions (Addendum)
RimabotulinumtoxinB injection What is this medicine? RIMABOTULINUMTOXINB is a neuro-muscular blocker. It is used to treat severe neck muscle spasms. This medicine may be used for other purposes; ask your health care provider or pharmacist if you have questions. What should I tell my health care provider before I take this medicine? They need to know if you have any of these conditions: -breathing problems -cerebral palsy spasms -history of surgery where this medicine is going to be used -infection where this medicine is going to be used -myasthenia gravis or other neurologic disease -nerve or muscle disease -surgery plans -an unusual or allergic reaction to botulinum toxin, albumin, other medicines, foods, dyes, or preservatives -pregnant or trying to get pregnant -breast-feeding How should I use this medicine? This medicine is for injection into a muscle. It is given by a health care professional in a hospital or clinic setting. Talk to your pediatrician regarding the use of this medicine in children. Special care may be needed. Overdosage: If you think you have taken too much of this medicine contact a poison control center or emergency room at once. NOTE: This medicine is only for you. Do not share this medicine with others. What if I miss a dose? This does not apply. What may interact with this medicine? -aminoglycoside antibiotics like gentamicin, neomycin, tobramycin -muscle relaxants -other botulinum toxin injections This list may not describe all possible interactions. Give your health care provider a list of all the medicines, herbs, non-prescription drugs, or dietary supplements you use. Also tell them if you smoke, drink alcohol, or use illegal drugs. Some items may interact with your medicine. What should I watch for while using this medicine? Visit your doctor for regular check ups. This medicine will cause weakness in the muscle where it is injected. Tell your doctor if  you feel unusually weak in other muscles. Get medical help right away if you have problems with breathing, swallowing, or talking. This medicine contains albumin from human blood. It may be possible to pass an infection in this medicine but no cases have been reported. Talk to your doctor about the risks and benefits of this medicine. If your activities have been limited by your condition, go back to your regular routine slowly after treatment with this medicine. This medicine can make your muscles weak. And, this medicine can make your eyelids droop or make you see blurry or double. If you have weak muscles or trouble seeing do not drive a car, use machinery, or do other dangerous activities. What side effects may I notice from receiving this medicine? Side effects that you should report to your doctor or health care professional as soon as possible: -allergic reactions like skin rash, itching or hives, swelling of the face, lips, or tongue -breathing problems -chest pain or tightness -infection -numbness -speech problems -swallowing problems Side effects that usually do not require medical attention (report to your doctor or health care professional if they continue or are bothersome): -bruising or pain at site where injected -dry eyes, mouth -headache -muscle pain -nausea, stomach upset This list may not describe all possible side effects. Call your doctor for medical advice about side effects. You may report side effects to FDA at 1-800-FDA-1088. Where should I keep my medicine? This drug is given in a hospital or clinic and will not be stored at home. NOTE: This sheet is a summary. It may not cover all possible information. If you have questions about this medicine, talk to your doctor, pharmacist, or health care  provider.  2013, [employer]. (10/10/2010 12:30:59 PM)

## 2012-11-26 ENCOUNTER — Other Ambulatory Visit: Payer: Self-pay | Admitting: Physical Medicine & Rehabilitation

## 2012-12-23 ENCOUNTER — Ambulatory Visit: Payer: Medicare Other | Admitting: Physical Medicine & Rehabilitation

## 2012-12-25 ENCOUNTER — Ambulatory Visit (HOSPITAL_BASED_OUTPATIENT_CLINIC_OR_DEPARTMENT_OTHER): Payer: Medicare Other | Admitting: Physical Medicine & Rehabilitation

## 2012-12-25 ENCOUNTER — Encounter: Payer: Medicare Other | Attending: Physical Medicine & Rehabilitation

## 2012-12-25 ENCOUNTER — Encounter: Payer: Self-pay | Admitting: Physical Medicine & Rehabilitation

## 2012-12-25 VITALS — BP 130/71 | HR 89 | Resp 14 | Ht 64.0 in | Wt 190.0 lb

## 2012-12-25 DIAGNOSIS — G243 Spasmodic torticollis: Secondary | ICD-10-CM | POA: Diagnosis not present

## 2012-12-25 DIAGNOSIS — M542 Cervicalgia: Secondary | ICD-10-CM | POA: Insufficient documentation

## 2012-12-25 DIAGNOSIS — G245 Blepharospasm: Secondary | ICD-10-CM | POA: Insufficient documentation

## 2012-12-25 DIAGNOSIS — Z5181 Encounter for therapeutic drug level monitoring: Secondary | ICD-10-CM | POA: Diagnosis not present

## 2012-12-25 MED ORDER — HYDROCODONE-ACETAMINOPHEN 5-500 MG PO TABS: 1.0000 | ORAL_TABLET | Freq: Every day | ORAL | Status: DC

## 2012-12-25 NOTE — Patient Instructions (Signed)
We will repeat myoblock injection 3 months from the prior injection on 11/11/2012 and then we will resume our every 4 months schedule at the 7500 unit dose Call your pharmacy for refill of Klonopin You have a written prescription for hydrocodone today We will do a trigger point injection in 2 weeks in the left trapezius muscle

## 2012-12-25 NOTE — Progress Notes (Signed)
  Subjective:    Patient ID: [name], female    DOB: 09-Nov-1946, 67 y.o.   MRN: [id number]  HPI Reduced Myobloc to 5000 units last injection on 11/11/2012. Patient has been experiencing increased pain in the back of the neck. Prior injection in July 2013 use 7500 units and included trapezius muscles as well Pain Inventory Average Pain 3 Pain Right Now 2 My pain is constant, sharp, burning, dull and tingling  In the last 24 hours, has pain interfered with the following? General activity 3 Relation with others 3 Enjoyment of life 3 What TIME of day is your pain at its worst? daytime Sleep (in general) Fair  Pain is worse with: walking and standing Pain improves with: heat/ice, medication, TENS and injections Relief from Meds: 5  Mobility Do you have any goals in this area?  no  Function Do you have any goals in this area?  no  Neuro/Psych No problems in this area  Prior Studies Any changes since last visit?  no  Physicians involved in your care Any changes since last visit?  no   History reviewed. No pertinent family history. History   Social History  . Marital Status: Married    Spouse Name: N/A    Number of Children: N/A  . Years of Education: N/A   Social History Main Topics  . Smoking status: Never Smoker   . Smokeless tobacco: Never Used  . Alcohol Use: None  . Drug Use: None  . Sexually Active: None   Other Topics Concern  . None   Social History Narrative  . None   History reviewed. No pertinent past surgical history. History reviewed. No pertinent past medical history. BP 130/71  Pulse 89  Resp 14  Ht 5\' 4"  (1.626 m)  Wt 190 lb (86.183 kg)  BMI 32.61 kg/m2  SpO2 92%    Review of Systems  HENT: Positive for neck pain.   All other systems reviewed and are negative.       Objective:   Physical Exam No evidence of blepharospasm Gen. No acute distress Left trapezius with tenderness at the upper aspect Left shoulder range of  motion strength normal       Assessment & Plan:  1. 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 Having increased pain in the trapezius area Recommend repeat mild block at 3 months mark which would be at the and of February Will do a trapezius trigger point injection at the and of January

## 2012-12-29 ENCOUNTER — Other Ambulatory Visit: Payer: Self-pay | Admitting: Family Medicine

## 2012-12-29 ENCOUNTER — Other Ambulatory Visit: Payer: Self-pay | Admitting: Physical Medicine & Rehabilitation

## 2012-12-29 DIAGNOSIS — Z1231 Encounter for screening mammogram for malignant neoplasm of breast: Secondary | ICD-10-CM

## 2012-12-29 NOTE — Telephone Encounter (Signed)
Patient is being prescribed hydrocodoen 5/500.  This has been discontinued.  What would you like her to change to?

## 2012-12-30 NOTE — Telephone Encounter (Signed)
Hydrocodone 5-325

## 2013-01-09 ENCOUNTER — Ambulatory Visit (HOSPITAL_BASED_OUTPATIENT_CLINIC_OR_DEPARTMENT_OTHER): Payer: Medicare Other | Admitting: Physical Medicine & Rehabilitation

## 2013-01-09 ENCOUNTER — Encounter: Payer: Self-pay | Admitting: Physical Medicine & Rehabilitation

## 2013-01-09 VITALS — BP 133/80 | HR 75 | Resp 14 | Ht 64.0 in | Wt 190.0 lb

## 2013-01-09 DIAGNOSIS — G245 Blepharospasm: Secondary | ICD-10-CM | POA: Diagnosis not present

## 2013-01-09 DIAGNOSIS — M7918 Myalgia, other site: Secondary | ICD-10-CM

## 2013-01-09 DIAGNOSIS — IMO0001 Reserved for inherently not codable concepts without codable children: Secondary | ICD-10-CM

## 2013-01-09 DIAGNOSIS — G243 Spasmodic torticollis: Secondary | ICD-10-CM

## 2013-01-09 DIAGNOSIS — M542 Cervicalgia: Secondary | ICD-10-CM | POA: Diagnosis not present

## 2013-01-09 NOTE — Patient Instructions (Signed)
Trigger point injection 3 areas over the trapezius muscle on the left side. 1 cc of lidocaine into each area

## 2013-01-09 NOTE — Progress Notes (Signed)
  Subjective:    Patient ID: [name], female    DOB: October 22, 1946, 67 y.o.   MRN: [id number]  HPI  Pain Inventory Average Pain 4 Pain Right Now 1 My pain is constant, sharp, burning, stabbing and aching  In the last 24 hours, has pain interfered with the following? General activity 4 Relation with others 4 Enjoyment of life 3 What TIME of day is your pain at its worst? day, evening and night Sleep (in general) Fair  Pain is worse with: walking, bending, standing and some activites Pain improves with: rest, heat/ice, medication, TENS and injections Relief from Meds: 6  Mobility how many minutes can you walk? 20 Do you have any goals in this area?  no  Function Do you have any goals in this area?  no  Neuro/Psych depression anxiety loss of taste or smell  Prior Studies Any changes since last visit?  no  Physicians involved in your care Any changes since last visit?  no   History reviewed. No pertinent family history. History   Social History  . Marital Status: Married    Spouse Name: N/A    Number of Children: N/A  . Years of Education: N/A   Social History Main Topics  . Smoking status: Never Smoker   . Smokeless tobacco: Never Used  . Alcohol Use: None  . Drug Use: None  . Sexually Active: None   Other Topics Concern  . None   Social History Narrative  . None   History reviewed. No pertinent past surgical history. History reviewed. No pertinent past medical history. BP 133/80  Pulse 75  Resp 14  Ht 5\' 4"  (1.626 m)  Wt 190 lb (86.183 kg)  BMI 32.61 kg/m2  SpO2 95%     Review of Systems     Objective:   Physical Exam        Assessment & Plan:  Trigger point injection Indication is myofascial pain left trapezius unresponsive to medication management and other conservative treatment Patient placed in a seated position 3 areas over the left trapezius were marked and prepped with Betadine and alcohol. A 25-gauge 1.5 inch needle  was used to inject 1 cc of lidocaine into each of 3  Spots. Patient tolerated procedure well. Post procedure instructions given RTC 2 months for her Myo bloc injection.

## 2013-01-22 ENCOUNTER — Ambulatory Visit
Admission: RE | Admit: 2013-01-22 | Discharge: 2013-01-22 | Disposition: A | Discharge status: Home / Self Care | Payer: Medicare Other | Source: Ambulatory Visit | Attending: Family Medicine | Admitting: Family Medicine

## 2013-01-22 DIAGNOSIS — Z1231 Encounter for screening mammogram for malignant neoplasm of breast: Secondary | ICD-10-CM

## 2013-01-29 ENCOUNTER — Other Ambulatory Visit: Payer: Self-pay | Admitting: Physical Medicine & Rehabilitation

## 2013-02-02 ENCOUNTER — Other Ambulatory Visit: Payer: Self-pay | Admitting: Physical Medicine & Rehabilitation

## 2013-02-02 MED ORDER — HYDROCODONE-ACETAMINOPHEN 5-325 MG PO TABS: ORAL_TABLET | ORAL | Status: DC

## 2013-02-27 ENCOUNTER — Other Ambulatory Visit: Payer: Self-pay | Admitting: Physical Medicine & Rehabilitation

## 2013-03-13 ENCOUNTER — Encounter: Payer: Medicare Other | Attending: Physical Medicine & Rehabilitation

## 2013-03-13 ENCOUNTER — Ambulatory Visit (HOSPITAL_BASED_OUTPATIENT_CLINIC_OR_DEPARTMENT_OTHER): Payer: Medicare Other | Admitting: Physical Medicine & Rehabilitation

## 2013-03-13 ENCOUNTER — Encounter: Payer: Self-pay | Admitting: Physical Medicine & Rehabilitation

## 2013-03-13 VITALS — BP 136/66 | HR 84 | Resp 14 | Ht 64.0 in | Wt 192.0 lb

## 2013-03-13 DIAGNOSIS — G245 Blepharospasm: Secondary | ICD-10-CM

## 2013-03-13 DIAGNOSIS — G243 Spasmodic torticollis: Secondary | ICD-10-CM | POA: Insufficient documentation

## 2013-03-13 NOTE — Progress Notes (Signed)
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 A total of 7500 units used. Needles 30-gauge 4 pretarsal and orbicularis oculi 27-gauge needle electrode under EMG guidance for remainder of muscles 250 units into left pretarsal 500 units into left orbicularis oculi 1750 units into the left sternocleidomastoid 500 units left scal anticus 500 units left scal medius 2500 units left splenius capitis 1500 units left trapezius

## 2013-03-13 NOTE — Patient Instructions (Signed)
Return to clinic 4 months We injected 7500 units of mMyobloc today

## 2013-03-24 DIAGNOSIS — M542 Cervicalgia: Secondary | ICD-10-CM | POA: Diagnosis not present

## 2013-03-24 DIAGNOSIS — M545 Low back pain, unspecified: Secondary | ICD-10-CM | POA: Diagnosis not present

## 2013-03-29 ENCOUNTER — Other Ambulatory Visit: Payer: Self-pay | Admitting: Physical Medicine & Rehabilitation

## 2013-03-30 DIAGNOSIS — M545 Low back pain, unspecified: Secondary | ICD-10-CM | POA: Diagnosis not present

## 2013-04-20 DIAGNOSIS — R7301 Impaired fasting glucose: Secondary | ICD-10-CM | POA: Diagnosis not present

## 2013-04-20 DIAGNOSIS — E782 Mixed hyperlipidemia: Secondary | ICD-10-CM | POA: Diagnosis not present

## 2013-04-20 DIAGNOSIS — I10 Essential (primary) hypertension: Secondary | ICD-10-CM | POA: Diagnosis not present

## 2013-04-20 DIAGNOSIS — F329 Major depressive disorder, single episode, unspecified: Secondary | ICD-10-CM | POA: Diagnosis not present

## 2013-04-20 DIAGNOSIS — Z0000 Encounter for general adult medical examination without abnormal findings: Secondary | ICD-10-CM | POA: Diagnosis not present

## 2013-04-20 DIAGNOSIS — Z6833 Body mass index (BMI) 33.0-33.9, adult: Secondary | ICD-10-CM | POA: Diagnosis not present

## 2013-04-20 DIAGNOSIS — E039 Hypothyroidism, unspecified: Secondary | ICD-10-CM | POA: Diagnosis not present

## 2013-04-20 DIAGNOSIS — E669 Obesity, unspecified: Secondary | ICD-10-CM | POA: Diagnosis not present

## 2013-04-24 DIAGNOSIS — Z1211 Encounter for screening for malignant neoplasm of colon: Secondary | ICD-10-CM | POA: Diagnosis not present

## 2013-04-24 DIAGNOSIS — D126 Benign neoplasm of colon, unspecified: Secondary | ICD-10-CM | POA: Diagnosis not present

## 2013-04-24 DIAGNOSIS — K5730 Diverticulosis of large intestine without perforation or abscess without bleeding: Secondary | ICD-10-CM | POA: Diagnosis not present

## 2013-04-27 DIAGNOSIS — M542 Cervicalgia: Secondary | ICD-10-CM | POA: Diagnosis not present

## 2013-04-27 DIAGNOSIS — M545 Low back pain, unspecified: Secondary | ICD-10-CM | POA: Diagnosis not present

## 2013-04-27 DIAGNOSIS — M47817 Spondylosis without myelopathy or radiculopathy, lumbosacral region: Secondary | ICD-10-CM | POA: Diagnosis not present

## 2013-04-27 DIAGNOSIS — M47812 Spondylosis without myelopathy or radiculopathy, cervical region: Secondary | ICD-10-CM | POA: Diagnosis not present

## 2013-04-29 ENCOUNTER — Other Ambulatory Visit: Payer: Self-pay | Admitting: *Deleted

## 2013-04-29 MED ORDER — HYDROCODONE-ACETAMINOPHEN 5-325 MG PO TABS: ORAL_TABLET | ORAL | Status: DC

## 2013-04-29 NOTE — Telephone Encounter (Signed)
Refilled hydrocodone from fax request from pharmacy.

## 2013-05-04 DIAGNOSIS — M542 Cervicalgia: Secondary | ICD-10-CM | POA: Diagnosis not present

## 2013-05-04 DIAGNOSIS — M545 Low back pain, unspecified: Secondary | ICD-10-CM | POA: Diagnosis not present

## 2013-05-05 ENCOUNTER — Telehealth: Payer: Self-pay

## 2013-05-05 NOTE — Telephone Encounter (Signed)
Patient wanted [place] to know she saw Dr Ethelene Hal.

## 2013-05-07 DIAGNOSIS — K529 Noninfective gastroenteritis and colitis, unspecified: Secondary | ICD-10-CM | POA: Diagnosis not present

## 2013-05-12 DIAGNOSIS — G4733 Obstructive sleep apnea (adult) (pediatric): Secondary | ICD-10-CM | POA: Diagnosis not present

## 2013-05-14 ENCOUNTER — Telehealth: Payer: Self-pay | Admitting: *Deleted

## 2013-05-14 DIAGNOSIS — M47817 Spondylosis without myelopathy or radiculopathy, lumbosacral region: Secondary | ICD-10-CM | POA: Diagnosis not present

## 2013-05-14 NOTE — Telephone Encounter (Signed)
FYI  Called to let Dr Wynn Banker know that Dr Ethelene Hal has prescribed hydrocodone q 6 hours and is going to do her spine injection next week.

## 2013-05-15 NOTE — Telephone Encounter (Signed)
Tell her I will not prescribe hydrocodone while Ramos is Is she having a cervical or lumbar injection? Does she know I do injections as well?

## 2013-05-15 NOTE — Telephone Encounter (Signed)
Notified [name] about medication. She is having both cervical and lumbar.

## 2013-06-02 DIAGNOSIS — IMO0002 Reserved for concepts with insufficient information to code with codable children: Secondary | ICD-10-CM | POA: Diagnosis not present

## 2013-06-02 DIAGNOSIS — M545 Low back pain, unspecified: Secondary | ICD-10-CM | POA: Diagnosis not present

## 2013-06-03 DIAGNOSIS — E039 Hypothyroidism, unspecified: Secondary | ICD-10-CM | POA: Diagnosis not present

## 2013-06-09 DIAGNOSIS — K529 Noninfective gastroenteritis and colitis, unspecified: Secondary | ICD-10-CM | POA: Diagnosis not present

## 2013-06-18 DIAGNOSIS — M545 Low back pain, unspecified: Secondary | ICD-10-CM | POA: Diagnosis not present

## 2013-07-09 ENCOUNTER — Encounter: Payer: Self-pay | Admitting: Physical Medicine & Rehabilitation

## 2013-07-09 ENCOUNTER — Ambulatory Visit (HOSPITAL_BASED_OUTPATIENT_CLINIC_OR_DEPARTMENT_OTHER): Payer: Medicare Other | Admitting: Physical Medicine & Rehabilitation

## 2013-07-09 ENCOUNTER — Encounter: Payer: Medicare Other | Attending: Physical Medicine & Rehabilitation

## 2013-07-09 VITALS — BP 133/73 | HR 93 | Resp 16 | Ht 64.0 in | Wt 192.0 lb

## 2013-07-09 DIAGNOSIS — R259 Unspecified abnormal involuntary movements: Secondary | ICD-10-CM | POA: Diagnosis not present

## 2013-07-09 DIAGNOSIS — G243 Spasmodic torticollis: Secondary | ICD-10-CM | POA: Insufficient documentation

## 2013-07-09 DIAGNOSIS — G245 Blepharospasm: Secondary | ICD-10-CM | POA: Insufficient documentation

## 2013-07-09 NOTE — Progress Notes (Signed)
Myobloc injection  Indication blepharospasm and spasmodic torticollis  Movement disorder not improved with medication management and other conservative care  Informed consent was obtained after describing risks and benefits of the procedure with the patient these include bleeding bruising and infection  Myobloc dosage 2500 units in 0.5 mL. A total of 7500 units used.  Needles 30-gauge pretarsal and orbicularis oculi  27-gauge needle electrode under EMG guidance for remainder of muscles  500 units into left pretarsal  500 units into left orbicularis oculi  1750 units into the left sternocleidomastoid  500 units left scal anticus  500 units left scal medius  2500 units left splenius capitis  1500 units left trapezius

## 2013-07-09 NOTE — Patient Instructions (Signed)
Injected with myobloc 7500Units today

## 2013-07-09 NOTE — Progress Notes (Signed)
  PROCEDURE RECORD [facility]   Name: TRACE CEDERBERG DOB:[birth year] MRN: [id number]  Date:07/09/2013  Physician: Claudette Laws, MD    Nurse/CMA: Shumaker RN   Allergies:  Allergies  Allergen Reactions  . Sulfa Antibiotics     Consent Signed: yes  Is patient diabetic? no  CBG today? na  Pregnant: no LMP: No LMP recorded. Patient has had a hysterectomy. (age [age])  Anticoagulants: no Anti-inflammatory: no Antibiotics: no  Procedure: myoblock injection 7500 units Position: Prone Start Time:   End Time:   Fluoro Time:   RN/CMA [employer] RN    Time 910     BP 133/73     Pulse 93     Respirations 16     O2 Sat 94     S/S 6 6    Pain Level 2      D/C home with mike,husband, patient A & O X 3, D/C instructions reviewed, and sits independently.

## 2013-08-13 DIAGNOSIS — Z79899 Other long term (current) drug therapy: Secondary | ICD-10-CM | POA: Diagnosis not present

## 2013-08-13 DIAGNOSIS — M542 Cervicalgia: Secondary | ICD-10-CM | POA: Diagnosis not present

## 2013-08-13 DIAGNOSIS — M47812 Spondylosis without myelopathy or radiculopathy, cervical region: Secondary | ICD-10-CM | POA: Diagnosis not present

## 2013-08-13 DIAGNOSIS — M47817 Spondylosis without myelopathy or radiculopathy, lumbosacral region: Secondary | ICD-10-CM | POA: Diagnosis not present

## 2013-09-25 DIAGNOSIS — Z23 Encounter for immunization: Secondary | ICD-10-CM | POA: Diagnosis not present

## 2013-10-01 DIAGNOSIS — T8189XA Other complications of procedures, not elsewhere classified, initial encounter: Secondary | ICD-10-CM | POA: Diagnosis not present

## 2013-10-08 DIAGNOSIS — M47817 Spondylosis without myelopathy or radiculopathy, lumbosacral region: Secondary | ICD-10-CM | POA: Diagnosis not present

## 2013-10-20 ENCOUNTER — Encounter: Payer: Self-pay | Admitting: Physical Medicine & Rehabilitation

## 2013-10-20 ENCOUNTER — Encounter: Payer: Medicare Other | Attending: Physical Medicine & Rehabilitation

## 2013-10-20 ENCOUNTER — Ambulatory Visit (HOSPITAL_BASED_OUTPATIENT_CLINIC_OR_DEPARTMENT_OTHER): Payer: Medicare Other | Admitting: Physical Medicine & Rehabilitation

## 2013-10-20 VITALS — BP 160/85 | HR 90 | Resp 14 | Ht 64.0 in | Wt 188.0 lb

## 2013-10-20 DIAGNOSIS — G245 Blepharospasm: Secondary | ICD-10-CM | POA: Diagnosis not present

## 2013-10-20 DIAGNOSIS — G243 Spasmodic torticollis: Secondary | ICD-10-CM | POA: Insufficient documentation

## 2013-10-20 NOTE — Progress Notes (Signed)
Myobloc injection  Indication blepharospasm and spasmodic torticollis  Movement disorder not improved with medication management and other conservative care  Informed consent was obtained after describing risks and benefits of the procedure with the patient these include bleeding bruising and infection  Myobloc dosage 2500 units in 0.5 mL. A total of 7500 units used.  Needles 30-gauge pretarsal and orbicularis oculi  27-gauge needle electrode under EMG guidance for remainder of muscles  500 units into left pretarsal  500 units into left orbicularis oculi  1750 units into the left sternocleidomastoid  500 units left scal anticus  500 units left scal medius  2500 units left splenius capitis  1500 units left trapezius

## 2013-10-20 NOTE — Patient Instructions (Signed)
You received a Myobloc injection today. You may experience soreness at the needle injection sites. Please call us if any of the injection sites turns red after a couple days or if there is any drainage. You may experience muscle weakness as a result of Botox. This would improve with time but can take several weeks to improve. The Myobloc should start working in about one week. The Myobloc usually last 3 months. The injection can be repeated every 3 months as needed.

## 2013-10-22 DIAGNOSIS — E782 Mixed hyperlipidemia: Secondary | ICD-10-CM | POA: Diagnosis not present

## 2013-11-03 DIAGNOSIS — M545 Low back pain, unspecified: Secondary | ICD-10-CM | POA: Diagnosis not present

## 2013-12-03 DIAGNOSIS — M545 Low back pain, unspecified: Secondary | ICD-10-CM | POA: Diagnosis not present

## 2013-12-03 DIAGNOSIS — M47817 Spondylosis without myelopathy or radiculopathy, lumbosacral region: Secondary | ICD-10-CM | POA: Diagnosis not present

## 2013-12-03 DIAGNOSIS — M47812 Spondylosis without myelopathy or radiculopathy, cervical region: Secondary | ICD-10-CM | POA: Diagnosis not present

## 2013-12-03 DIAGNOSIS — M542 Cervicalgia: Secondary | ICD-10-CM | POA: Diagnosis not present

## 2013-12-30 DIAGNOSIS — M47817 Spondylosis without myelopathy or radiculopathy, lumbosacral region: Secondary | ICD-10-CM | POA: Diagnosis not present

## 2014-01-27 DIAGNOSIS — R259 Unspecified abnormal involuntary movements: Secondary | ICD-10-CM | POA: Diagnosis not present

## 2014-02-04 DIAGNOSIS — M47817 Spondylosis without myelopathy or radiculopathy, lumbosacral region: Secondary | ICD-10-CM | POA: Diagnosis not present

## 2014-02-16 ENCOUNTER — Encounter: Payer: Medicare Other | Attending: Physical Medicine & Rehabilitation

## 2014-02-16 ENCOUNTER — Encounter: Payer: Self-pay | Admitting: Physical Medicine & Rehabilitation

## 2014-02-16 ENCOUNTER — Ambulatory Visit (HOSPITAL_BASED_OUTPATIENT_CLINIC_OR_DEPARTMENT_OTHER): Payer: Medicare Other | Admitting: Physical Medicine & Rehabilitation

## 2014-02-16 VITALS — BP 168/91 | HR 95 | Resp 14 | Ht 64.0 in | Wt 190.0 lb

## 2014-02-16 DIAGNOSIS — G245 Blepharospasm: Secondary | ICD-10-CM

## 2014-02-16 DIAGNOSIS — G243 Spasmodic torticollis: Secondary | ICD-10-CM | POA: Diagnosis not present

## 2014-02-16 NOTE — Progress Notes (Signed)
Myobloc injection  Indication blepharospasm and spasmodic torticollis  Movement disorder not improved with medication management and other conservative care  Informed consent was obtained after describing risks and benefits of the procedure with the patient these include bleeding bruising and infection  Myobloc dosage 2500 units in 0.5 mL. A total of 7500 units used.  Needles 30-gauge pretarsal and orbicularis oculi  27-gauge needle electrode under EMG guidance for remainder of muscles  250 units into left pretarsal  250 units into left orbicularis oculi  2500 units into the left sternocleidomastoid  500 units left scal anticus  500 units left scal medius  2000 units left splenius capitis  1500 units left trapezius  Based on EMG consider following next visit 250 left pretarsal 250 left orbicularis oculi 3000 left splenius capitis 1500 left trapezius 500 left scalene anticus 500 left scalene medius 1500 left sternocleidomastoid

## 2014-02-16 NOTE — Patient Instructions (Signed)
Medication should take effect in 1-2 weeks

## 2014-02-23 ENCOUNTER — Ambulatory Visit (INDEPENDENT_AMBULATORY_CARE_PROVIDER_SITE_OTHER): Payer: Medicare Other | Admitting: Neurology

## 2014-02-23 ENCOUNTER — Encounter: Payer: Self-pay | Admitting: Neurology

## 2014-02-23 VITALS — BP 132/86 | HR 86 | Ht 64.0 in | Wt 188.0 lb

## 2014-02-23 DIAGNOSIS — G243 Spasmodic torticollis: Secondary | ICD-10-CM

## 2014-02-23 DIAGNOSIS — M129 Arthropathy, unspecified: Secondary | ICD-10-CM

## 2014-02-23 DIAGNOSIS — Z79899 Other long term (current) drug therapy: Secondary | ICD-10-CM | POA: Diagnosis not present

## 2014-02-23 DIAGNOSIS — K5289 Other specified noninfective gastroenteritis and colitis: Secondary | ICD-10-CM | POA: Diagnosis not present

## 2014-02-23 DIAGNOSIS — K529 Noninfective gastroenteritis and colitis, unspecified: Secondary | ICD-10-CM

## 2014-02-23 DIAGNOSIS — M1990 Unspecified osteoarthritis, unspecified site: Secondary | ICD-10-CM

## 2014-02-23 DIAGNOSIS — R413 Other amnesia: Secondary | ICD-10-CM | POA: Diagnosis not present

## 2014-02-23 MED ORDER — ROPINIROLE HCL 1 MG PO TABS: 1.0000 mg | ORAL_TABLET | Freq: Every day | ORAL | Status: DC

## 2014-02-23 NOTE — Progress Notes (Signed)
PATIENT: [name] DOB: [birth year]  HISTORICAL  [name]  Is a 68 yo [place] is referred by her primary care physician Dr. Brigitte Pulse, Joelene Millin from [place] family, accompanied by her husband at today's clinical visit  She had past medical history of cervical dystonia, blepharospasm, is receiving EMG guided myoblock injection by Dr. Elnita Maxwell, last injection was March third used 7500 units, she also had past medical history of hypertension.  In October 2014, without clear triggers, she began to notice right foot constant moving, sometimes rotatory  movement at her right ankle, the other times are toe  flexion and extension, her husband also noticed left foot movement, Sometimes even worse than her right foot, she can have voluntary control of her right foot movement, but she has to really try hard. She continued to have bilateral foot abnormal movements before going to sleep, but there was no abnormal movement when she truly failing to sleep  She also has mild unsteady gait, she has fell some times,  She has chronic depression, now with her problem she has worsening frustration, is taking clonazepam, Ambien as needed for insomnia She denied persistent bilateral lower extremity paresthesia or weakness   REVIEW OF SYSTEMS: Full 14 system review of systems performed and notable only for insomnia, snoring, depression, anxiety, decreased energy, is disinterested in activities, suicidal thoughts, racing thoughts,   ALLERGIES: Allergies  Allergen Reactions  . Sulfa Antibiotics     HOME MEDICATIONS: Current Outpatient Prescriptions on File Prior to Visit  Medication Sig Dispense Refill  . amLODipine (NORVASC) 5 MG tablet       . atorvastatin (LIPITOR) 20 MG tablet Take 1 tablet by mouth Daily.      . AZOR 5-20 MG per tablet Take 1 tablet by mouth Daily.      Marland Kitchen BENICAR 20 MG tablet       . clonazePAM (KLONOPIN) 2 MG tablet TAKE 1 TABLET BY MOUTH TWICE A DAY  45  tablet  4  . gabapentin (NEURONTIN) 100 MG capsule       . HYDROcodone-acetaminophen (NORCO/VICODIN) 5-325 MG per tablet TAKE 1-2 TABLET BY MOUTH EVERY DAY AS NEEDED  45 tablet  0  . LATUDA 80 MG TABS Take 1 tablet by mouth Daily.      Marland Kitchen levothyroxine (SYNTHROID, LEVOTHROID) 25 MCG tablet       . MICRONIZED COLESTIPOL HCL 1 G tablet       . PEG 3350-KCl-NaBcb-NaCl-NaSulf (PEG-3350/ELECTROLYTES) 236 G SOLR       . sertraline (ZOLOFT) 100 MG tablet Take 1 tablet by mouth Daily.      Marland Kitchen zolpidem (AMBIEN) 10 MG tablet Take 1 tablet by mouth At bedtime as needed.       No current facility-administered medications on file prior to visit.    PAST MEDICAL HISTORY: No past medical history on file.  PAST SURGICAL HISTORY: No past surgical history on file.  FAMILY HISTORY: Family History  Problem Relation Age of Onset  . Alzheimer's disease Mother   . Heart disease Father     SOCIAL HISTORY:  History   Social History  . Marital Status: Married    Spouse Name: N/A    Number of Children: 3  . Years of Education: N/A   Occupational History  . Home Maker.   Social History Main Topics  . Smoking status: Never Smoker   . Smokeless tobacco: Never Used  . Alcohol Use: Not on file  . Drug Use:  Not on file  . Sexual Activity: Not on file   Other Topics Concern  . Not on file   Social History Narrative  . No narrative on file   PHYSICAL EXAM   Filed Vitals:   02/23/14 1506  BP: 132/86  Pulse: 86   There is no weight on file to calculate BMI.   Generalized: In no acute distress  Neck: Supple, no carotid bruits   Cardiac: Regular rate rhythm  Pulmonary: Clear to auscultation bilaterally  Musculoskeletal: No deformity  Neurological examination  Mentation: Alert oriented to time, place, history taking, and causual conversation, she has mild retrocollis, slight left tilt, I have reviewed her previous driver's license picture, dated 2012, she has mild to moderate  retrocollis, mild right tilt on picture  Cranial nerve II-XII: Pupils were equal round reactive to light. Extraocular movements were full.  Visual field were full on confrontational test. Bilateral fundi were sharp.  Facial sensation and strength were normal. Hearing was intact to finger rubbing bilaterally. Uvula tongue midline.  Head turning and shoulder shrug and were normal and symmetric.Tongue protrusion into cheek strength was normal. I do not notice any significant eye blinking  Motor: Normal tone, bulk and strength, she has frequent right foot movement, sometimes with rotatory movement, sometimes with toe flexion/extension, it is under her voluntary control, during distraction, I do not see any significant foot movement, only occasionally left foot movement  Sensory: Intact to fine touch, pinprick, preserved vibratory sensation, and proprioception at toes.  Coordination: Normal finger to nose, heel-to-shin bilaterally there was no truncal ataxia  Gait: Rising up from seated position without assistance, normal stance, without trunk ataxia, moderate stride, good arm swing, smooth turning, able to perform tiptoe, and heel walking without difficulty.   Romberg signs: Negative  Deep tendon reflexes: Brachioradialis 2/2, biceps 2/2, triceps 2/2, patellar 2/2, Achilles 2/2, plantar responses were flexor bilaterally.   DIAGNOSTIC DATA (LABS, IMAGING, TESTING) - I reviewed patient records, labs, notes, testing and imaging myself where available.  Lab Results  Component Value Date   WBC 7.2 01/17/2011   HGB 9.7* 01/17/2011   HCT 30.5* 01/17/2011   MCV 86.2 01/17/2011   PLT 204 01/17/2011      Component Value Date/Time   NA 141 01/17/2011 0438   K 4.4 01/17/2011 0438   CL 106 01/17/2011 0438   CO2 27 01/17/2011 0438   GLUCOSE 128* 01/17/2011 0438   BUN 12 01/17/2011 0438   CREATININE 1.11 01/17/2011 0438   CALCIUM 8.9 01/17/2011 0438   PROT 5.8* 10/05/2010 0555   ALBUMIN 2.1* 10/05/2010 0555   AST 24  10/05/2010 0555   ALT 18 10/05/2010 0555   ALKPHOS 77 10/05/2010 0555   BILITOT 0.4 10/05/2010 0555   GFRNONAA 49* 01/17/2011 0438   GFRAA  Value: 60        The eGFR has been calculated using the MDRD equation. This calculation has not been validated in all clinical situations. eGFR's persistently <60 mL/min signify possible Chronic Kidney Disease.* 01/17/2011 0438   ASSESSMENT AND PLAN  [name] is a 68 y.o. female few months history of bilateral foot abnormal movement, during today's examination, she has no significant lower extremity movement with distraction, and her bilateral feet movements is also under her voluntary control,   1. 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 Laboratory evaluation 3. MRI of the brain 4.. Will try Requip  1 mg every night  Marcial Pacas, M.D. Ph.D.  [facility] [address] [place], [place] [address] [phone]

## 2014-02-24 LAB — THYROID PANEL WITH TSH
FREE THYROXINE INDEX: 1.6 (ref 1.2–4.9)
T3 UPTAKE RATIO: 27 % (ref 24–39)
T4 TOTAL: 5.9 ug/dL (ref 4.5–12.0)
TSH: 3.47 u[IU]/mL (ref 0.450–4.500)

## 2014-02-24 LAB — COMPREHENSIVE METABOLIC PANEL
ALK PHOS: 78 IU/L (ref 39–117)
ALT: 19 IU/L (ref 0–32)
AST: 19 IU/L (ref 0–40)
Albumin/Globulin Ratio: 2 (ref 1.1–2.5)
Albumin: 4.6 g/dL (ref 3.6–4.8)
BUN/Creatinine Ratio: 9 — ABNORMAL LOW (ref 11–26)
BUN: 10 mg/dL (ref 8–27)
CALCIUM: 9.6 mg/dL (ref 8.7–10.3)
CHLORIDE: 100 mmol/L (ref 97–108)
CO2: 23 mmol/L (ref 18–29)
Creatinine, Ser: 1.12 mg/dL — ABNORMAL HIGH (ref 0.57–1.00)
GFR calc Af Amer: 59 mL/min/{1.73_m2} — ABNORMAL LOW (ref >59)
GFR calc non Af Amer: 51 mL/min/{1.73_m2} — ABNORMAL LOW (ref >59)
GLUCOSE: 92 mg/dL (ref 65–99)
Globulin, Total: 2.3 g/dL (ref 1.5–4.5)
Potassium: 4.3 mmol/L (ref 3.5–5.2)
Sodium: 140 mmol/L (ref 134–144)
TOTAL PROTEIN: 6.9 g/dL (ref 6.0–8.5)
Total Bilirubin: <0.2 mg/dL (ref 0.0–1.2)

## 2014-02-24 LAB — CK: CK TOTAL: 82 U/L (ref 24–173)

## 2014-02-24 LAB — VITAMIN B12: Vitamin B-12: 348 pg/mL (ref 211–946)

## 2014-02-24 LAB — RPR: RPR: NONREACTIVE

## 2014-02-24 LAB — SEDIMENTATION RATE: SED RATE: 15 mm/h (ref 0–40)

## 2014-02-24 LAB — LYME, TOTAL AB TEST/REFLEX: Lyme IgG/IgM Ab: <0.91 {ISR} (ref 0.00–0.90)

## 2014-02-24 LAB — FOLATE: FOLATE: 12 ng/mL (ref >3.0)

## 2014-02-24 LAB — IRON AND TIBC
IRON SATURATION: 14 % — AB (ref 15–55)
Iron: 41 ug/dL (ref 35–155)
TIBC: 287 ug/dL (ref 250–450)
UIBC: 246 ug/dL (ref 150–375)

## 2014-02-24 LAB — C-REACTIVE PROTEIN: CRP: 1.6 mg/L (ref 0.0–4.9)

## 2014-02-24 LAB — FERRITIN: FERRITIN: 179 ng/mL — AB (ref 15–150)

## 2014-03-05 ENCOUNTER — Ambulatory Visit
Admission: RE | Admit: 2014-03-05 | Discharge: 2014-03-05 | Disposition: A | Discharge status: Home / Self Care | Payer: Medicare Other | Source: Ambulatory Visit | Attending: Neurology | Admitting: Neurology

## 2014-03-05 DIAGNOSIS — G243 Spasmodic torticollis: Secondary | ICD-10-CM

## 2014-03-05 DIAGNOSIS — M1990 Unspecified osteoarthritis, unspecified site: Secondary | ICD-10-CM

## 2014-03-05 DIAGNOSIS — K529 Noninfective gastroenteritis and colitis, unspecified: Secondary | ICD-10-CM

## 2014-03-10 NOTE — Progress Notes (Signed)
Quick Note:  Called to share unchanged MRI brain since 2011, patient verbalized understanding but wanted to let the physician know that the requip-1 mg medication given is not helping ______

## 2014-03-11 ENCOUNTER — Telehealth: Payer: Self-pay | Admitting: Neurology

## 2014-03-11 NOTE — Telephone Encounter (Signed)
Spoke with patient and she said that she is still having movements and pain in feet, Dr Krista Blue has prescribed her rOPINIRole (REQUIP) 1 MG tablet,but it is not helping.

## 2014-03-11 NOTE — Telephone Encounter (Signed)
I called the numbers listed the home number is nonfunctional, left a message on the other I'll call patient back later.

## 2014-03-12 NOTE — Telephone Encounter (Signed)
I called again, the home telephone number is nonfunctional. The work number as been called several times, messages have been left. I have asked the patient to contact our office if she still needs assistance, and gives a telephone number that is working that we can reach her at.

## 2014-03-15 ENCOUNTER — Telehealth: Payer: Self-pay | Admitting: Neurology

## 2014-03-15 MED ORDER — TETRABENAZINE 25 MG PO TABS: 25.0000 mg | ORAL_TABLET | Freq: Two times a day (BID) | ORAL | Status: DC

## 2014-03-15 NOTE — Telephone Encounter (Signed)
Patient returning call.

## 2014-03-15 NOTE — Telephone Encounter (Signed)
I called patient. The patient has an unusual uncontrollable movement of her leg on the right. The patient also has cervical dystonia and blepharospasm, and she is on antipsychotic medication such as Latuda. I would be concerned that these movements represent a tardive movement disorder. I will give a trial on Xenazine, and the patient will stop the Requip.

## 2014-03-15 NOTE — Telephone Encounter (Signed)
Spoke with patient and she said that they were out of town and had no cell phone service but will be [phone] for the reminder of the day, still having same problems.

## 2014-03-16 ENCOUNTER — Telehealth: Payer: Self-pay | Admitting: Neurology

## 2014-03-16 NOTE — Telephone Encounter (Signed)
Patient calling to state that her psychiatrist Dr. Caprice Beaver would like Dr. Jannifer Franklin to call her to discuss patient's Latuda and foot movement. Dr. Caprice Beaver at [phone].

## 2014-03-17 ENCOUNTER — Telehealth: Payer: Self-pay | Admitting: Neurology

## 2014-03-17 NOTE — Telephone Encounter (Signed)
Patient f/u on pre authorization for Xenozine--please call-thank you.

## 2014-03-17 NOTE — Telephone Encounter (Signed)
We received the PA paperwork at 4:17pm yesterday.  I completed it and faxed it back.  It is pending insurance response as to wether or not they will approve our request for coverage on this drug.  This typically takes 3-5 business days.  I called the patient back.  Explained the process.  She verbalized understanding.

## 2014-03-18 NOTE — Telephone Encounter (Signed)
Spoke with husband and he would like for Dr Jannifer Franklin to contact him personally and also would like for him to call Dr Caprice Beaver to discuss patient's latuda and foot [phone])

## 2014-03-18 NOTE — Telephone Encounter (Signed)
Pt's husband Legrand Como called states pt is in a lot of pain and is requesting that Dr. Jannifer Franklin call him back concerning this matter. Thanks

## 2014-03-18 NOTE — Telephone Encounter (Signed)
I called the office of Dr. Caprice Beaver, left a message for them to contact me on my cell phone regarding this patient.

## 2014-03-19 ENCOUNTER — Telehealth: Payer: Self-pay | Admitting: Neurology

## 2014-03-19 NOTE — Telephone Encounter (Signed)
Pt's husband Legrand Como called states that they can't release the Xenozine until Dr. Jannifer Franklin fills out the form and signs it and also the pt has to sign it. Legrand Como said he can be reached between now -1 or after 2. He states this can't be done until Dr. Jannifer Franklin completes this form and pt's signs it also. Thanks

## 2014-03-19 NOTE — Telephone Encounter (Signed)
I have completed the form and sent it to the office for provider signature.  I am in contact with Butch Penny regarding this.

## 2014-03-19 NOTE — Telephone Encounter (Signed)
I talked with Dr. Arvil Persons office. She is out of town. I will fax over the office visit report.

## 2014-03-22 ENCOUNTER — Telehealth: Payer: Self-pay | Admitting: Neurology

## 2014-03-22 ENCOUNTER — Telehealth: Payer: Self-pay

## 2014-03-22 NOTE — Telephone Encounter (Signed)
Insurance sent [place] a letter saying they have denied our request for coverage on Xenazine.  (No formulary alternatives given).  Would you like to appeal or change med?  Please advise.  Thank you.

## 2014-03-22 NOTE — Telephone Encounter (Signed)
Called pt and left message informing her that 03/2714 was as soon as I could get her in to see Dr. Krista Blue and that Dr. Jannifer Franklin was the [facility] and pt stated that she did not want to speak with Dr. Jannifer Franklin and I stated that I did not know what she wanted to do at this point and if she could give me a call back regarding this matter.

## 2014-03-22 NOTE — Telephone Encounter (Signed)
Patient is calling regarding referral to a movement specialist---when calling back if getting VM on first telephone# please call [phone]--thank you.

## 2014-03-22 NOTE — Telephone Encounter (Signed)
Pt's husband called.  He states his wife is in a lot of pain in both of her feet.  Both feet are moving involuntarily and it is making walking hard, she is falling and it is affecting her depression.  They want to see someone, it doesn't have to be Dr. Krista Blue, sooner. They stated that they would prefer not to speak to Dr. Jannifer Franklin.  The phone numbers where they can be reached are [phone] (cell) and [phone] ([place]  Please Call.  Thank you

## 2014-03-22 NOTE — Telephone Encounter (Signed)
I knew [name] would be denied. We can appeal, not sure we can get the medication.

## 2014-03-22 NOTE — Telephone Encounter (Signed)
Called pt and pt stated that she is in a lot of pain and wanted to come in sooner to see Dr. Krista Blue, made an appt for the pt to come in to see Dr. Krista Blue on 04/12/14, I advised the pt that if she has any other problems, questions or concerns to call the office. Pt verbalized understanding.

## 2014-04-12 ENCOUNTER — Ambulatory Visit (INDEPENDENT_AMBULATORY_CARE_PROVIDER_SITE_OTHER): Payer: Medicare Other | Admitting: Neurology

## 2014-04-12 ENCOUNTER — Encounter (INDEPENDENT_AMBULATORY_CARE_PROVIDER_SITE_OTHER): Payer: Self-pay

## 2014-04-12 ENCOUNTER — Encounter: Payer: Self-pay | Admitting: Neurology

## 2014-04-12 VITALS — BP 136/88 | HR 83 | Ht 64.0 in | Wt 185.0 lb

## 2014-04-12 DIAGNOSIS — K5289 Other specified noninfective gastroenteritis and colitis: Secondary | ICD-10-CM

## 2014-04-12 DIAGNOSIS — M129 Arthropathy, unspecified: Secondary | ICD-10-CM

## 2014-04-12 DIAGNOSIS — K529 Noninfective gastroenteritis and colitis, unspecified: Secondary | ICD-10-CM

## 2014-04-12 DIAGNOSIS — M1990 Unspecified osteoarthritis, unspecified site: Secondary | ICD-10-CM

## 2014-04-12 DIAGNOSIS — G243 Spasmodic torticollis: Secondary | ICD-10-CM

## 2014-04-12 DIAGNOSIS — G245 Blepharospasm: Secondary | ICD-10-CM

## 2014-04-12 NOTE — Progress Notes (Signed)
PATIENT: [name] DOB: May 23, 1946  HISTORICAL  [name]  Is a 68 yo [place] is referred by her primary care physician Dr. Brigitte Pulse, Joelene Millin from [place] [facility], accompanied by her husband at today's clinical visit  She had past medical history of cervical dystonia, blepharospasm, is receiving EMG guided myoblock injection by Dr. Elnita Maxwell, last injection was March third used 7500 units, she also had past medical history of hypertension.  In October 2014, without clear triggers, she began to notice right foot constant moving, sometimes rotatory  movement at her right ankle, the other times are toe  flexion and extension, her husband also noticed left foot movement, Sometimes even worse than her right foot, she can have voluntary control of her right foot movement, but she has to really try hard. She continued to have bilateral foot abnormal movements before going to sleep, but there was no abnormal movement when she truly failing to sleep  She also has mild unsteady gait, she has fell some times,  She has chronic depression, now with her problem she has worsening frustration, is taking clonazepam, Ambien as needed for insomnia She denied persistent bilateral lower extremity paresthesia or weakness  UPDATE April 29th 2015: She continued to have right foot, and the right leg intermittent movements  Have reviewed MRI scan together, Mild scattered periventricular and subcortical foci of non-specific T2 hyperintensities, most consistent with microvascular ischemic etiologies. No change from MRI on 09/20/10  Laboratory evaluation showed normal or negative CMP, CBC, RPR, B12, TSH, Lyme titer, ANA, C-reactive protein.  We have tried Requip without improvement,  REVIEW OF SYSTEMS: Full 14 system review of systems performed and notable only for right foot muscle cramping, and pain   ALLERGIES: Allergies  Allergen Reactions  . Sulfa Antibiotics     HOME  MEDICATIONS: Current Outpatient Prescriptions on File Prior to Visit  Medication Sig Dispense Refill  . atorvastatin (LIPITOR) 20 MG tablet Take 1 tablet by mouth Daily.      . AZOR 5-20 MG per tablet Take 1 tablet by mouth Daily.      Marland Kitchen BENICAR 20 MG tablet       . clonazePAM (KLONOPIN) 2 MG tablet TAKE 1 TABLET BY MOUTH TWICE A DAY  45 tablet  4  . HYDROcodone-acetaminophen (NORCO/VICODIN) 5-325 MG per tablet TAKE 1-2 TABLET BY MOUTH EVERY DAY AS NEEDED  45 tablet  0  . LATUDA 80 MG TABS Take 1 tablet by mouth Daily.      Marland Kitchen levothyroxine (SYNTHROID, LEVOTHROID) 25 MCG tablet       . MICRONIZED COLESTIPOL HCL 1 G tablet       . rOPINIRole (REQUIP) 1 MG tablet Take 1 tablet (1 mg total) by mouth at bedtime.  30 tablet  12  . sertraline (ZOLOFT) 100 MG tablet Take 1 tablet by mouth Daily.      Marland Kitchen zolpidem (AMBIEN) 10 MG tablet Take 1 tablet by mouth At bedtime as needed.       No current facility-administered medications on file prior to visit.    PAST MEDICAL HISTORY: Past Medical History  Diagnosis Date  . Cervical dystonia   . Colitis   . Arthritis   . Hypertension     PAST SURGICAL HISTORY: Past Surgical History  Procedure Laterality Date  . Abdominal hysterectomy    . Knee surgery    . Rotator cuff repair    . Cholecystectomy      FAMILY HISTORY: Family History  Problem Relation Age of Onset  . Alzheimer's disease Mother   . Heart disease Father     SOCIAL HISTORY:  History   Social History  . Marital Status: Married    Spouse Name: N/A    Number of Children: 3  . Years of Education: N/A   Occupational History  . Home Maker.   Social History Main Topics  . Smoking status: Never Smoker   . Smokeless tobacco: Never Used  . Alcohol Use: Not on file  . Drug Use: Not on file  . Sexual Activity: Not on file   Other Topics Concern  . Not on file   Social History Narrative  . No narrative on file   PHYSICAL EXAM   Filed Vitals:   04/12/14 0845  BP:  136/88  Pulse: 83  Height: 5\' 4"  (1.626 m)  Weight: 185 lb (83.915 kg)   Body mass index is 31.74 kg/(m^2).   Generalized: In no acute distress  Neck: Supple, no carotid bruits   Cardiac: Regular rate rhythm  Pulmonary: Clear to auscultation bilaterally  Musculoskeletal: No deformity  Neurological examination  Mentation: Alert oriented to time, place, history taking, and causual conversation, she has mild retrocollis, slight left tilt, I have reviewed her previous driver's license picture, dated 2012, she has mild to moderate retrocollis, mild right tilt on picture  Cranial nerve II-XII: Pupils were equal round reactive to light. Extraocular movements were full.  Visual field were full on confrontational test. Bilateral fundi were sharp.  Facial sensation and strength were normal. Hearing was intact to finger rubbing bilaterally. Uvula tongue midline.  Head turning and shoulder shrug and were normal and symmetric.Tongue protrusion into cheek strength was normal. I do not notice any significant eye blinking  Motor: Normal tone, bulk and strength, she has frequent right foot movement, sometimes with rotatory movement, sometimes with toe flexion/extension, it is under her voluntary control, during distraction, I do not see any significant foot movement, only occasionally left foot movement  Sensory: Intact to fine touch, pinprick, preserved vibratory sensation, and proprioception at toes.  Coordination: Normal finger to nose, heel-to-shin bilaterally there was no truncal ataxia  Gait: Rising up from seated position without assistance, normal stance, without trunk ataxia, moderate stride, good arm swing, smooth turning, able to perform tiptoe, and heel walking without difficulty.   Romberg signs: Negative  Deep tendon reflexes: Brachioradialis 2/2, biceps 2/2, triceps 2/2, patellar 2/2, Achilles 2/2, plantar responses were flexor bilaterally.   DIAGNOSTIC DATA (LABS, IMAGING,  TESTING) - I reviewed patient records, labs, notes, testing and imaging myself where available.  Lab Results  Component Value Date   WBC 7.2 01/17/2011   HGB 9.7* 01/17/2011   HCT 30.5* 01/17/2011   MCV 86.2 01/17/2011   PLT 204 01/17/2011      Component Value Date/Time   NA 140 02/23/2014 1559   NA 141 01/17/2011 0438   K 4.3 02/23/2014 1559   CL 100 02/23/2014 1559   CO2 23 02/23/2014 1559   GLUCOSE 92 02/23/2014 1559   GLUCOSE 128* 01/17/2011 0438   BUN 10 02/23/2014 1559   BUN 12 01/17/2011 0438   CREATININE 1.12* 02/23/2014 1559   CALCIUM 9.6 02/23/2014 1559   PROT 6.9 02/23/2014 1559   PROT 5.8* 10/05/2010 0555   ALBUMIN 2.1* 10/05/2010 0555   AST 19 02/23/2014 1559   ALT 19 02/23/2014 1559   ALKPHOS 78 02/23/2014 1559   BILITOT <0.2 02/23/2014 1559   GFRNONAA 51* 02/23/2014 1559  GFRAA 36* 02/23/2014 1559   ASSESSMENT AND PLAN  [name] is a 68 y.o. female few months history of bilateral foot abnormal movement, during today's examination, she has no significant lower extremity movement with distraction, and her bilateral feet movements seems to be under her voluntary control,   Not sure etiology, laboratory, and MRI findings reviewed no significant pathology, will refer her to [facility] movement disorder clinic.  Marcial Pacas, M.D. Ph.D.  [facility] [address] [place], [place] [address] [phone]

## 2014-04-23 ENCOUNTER — Telehealth: Payer: Self-pay | Admitting: Neurology

## 2014-04-23 DIAGNOSIS — G245 Blepharospasm: Secondary | ICD-10-CM

## 2014-04-23 DIAGNOSIS — G243 Spasmodic torticollis: Secondary | ICD-10-CM

## 2014-04-23 DIAGNOSIS — K529 Noninfective gastroenteritis and colitis, unspecified: Secondary | ICD-10-CM

## 2014-04-23 NOTE — Telephone Encounter (Signed)
Please call patient, I have put in refer for physical therapy

## 2014-04-26 NOTE — Telephone Encounter (Signed)
I have referred her to [facility] movement disorder clinic.

## 2014-04-26 NOTE — Telephone Encounter (Signed)
DR. Krista Blue please  put a referral for [facility] in the system for this patient.. She also stated to me she wants to put off taking PT at this time she will call and let tus know once she wants to go.

## 2014-04-27 ENCOUNTER — Ambulatory Visit: Payer: Medicare Other | Admitting: Neurology

## 2014-05-25 DIAGNOSIS — M545 Low back pain, unspecified: Secondary | ICD-10-CM | POA: Diagnosis not present

## 2014-05-25 DIAGNOSIS — M542 Cervicalgia: Secondary | ICD-10-CM | POA: Diagnosis not present

## 2014-06-02 DIAGNOSIS — S52599A Other fractures of lower end of unspecified radius, initial encounter for closed fracture: Secondary | ICD-10-CM | POA: Diagnosis not present

## 2014-06-02 DIAGNOSIS — IMO0002 Reserved for concepts with insufficient information to code with codable children: Secondary | ICD-10-CM | POA: Diagnosis not present

## 2014-06-02 DIAGNOSIS — M25539 Pain in unspecified wrist: Secondary | ICD-10-CM | POA: Diagnosis not present

## 2014-06-08 DIAGNOSIS — M5030 Other cervical disc degeneration, unspecified cervical region: Secondary | ICD-10-CM | POA: Diagnosis not present

## 2014-06-08 DIAGNOSIS — M25539 Pain in unspecified wrist: Secondary | ICD-10-CM | POA: Diagnosis not present

## 2014-06-08 DIAGNOSIS — M47812 Spondylosis without myelopathy or radiculopathy, cervical region: Secondary | ICD-10-CM | POA: Diagnosis not present

## 2014-06-08 DIAGNOSIS — S52599A Other fractures of lower end of unspecified radius, initial encounter for closed fracture: Secondary | ICD-10-CM | POA: Diagnosis not present

## 2014-06-08 DIAGNOSIS — M47817 Spondylosis without myelopathy or radiculopathy, lumbosacral region: Secondary | ICD-10-CM | POA: Diagnosis not present

## 2014-06-08 DIAGNOSIS — Z79899 Other long term (current) drug therapy: Secondary | ICD-10-CM | POA: Diagnosis not present

## 2014-06-17 ENCOUNTER — Ambulatory Visit (HOSPITAL_BASED_OUTPATIENT_CLINIC_OR_DEPARTMENT_OTHER): Payer: Medicare Other | Admitting: Physical Medicine & Rehabilitation

## 2014-06-17 ENCOUNTER — Encounter: Payer: Self-pay | Admitting: Physical Medicine & Rehabilitation

## 2014-06-17 ENCOUNTER — Encounter: Payer: Medicare Other | Attending: Physical Medicine & Rehabilitation

## 2014-06-17 VITALS — BP 169/92 | HR 77 | Resp 14 | Wt 175.6 lb

## 2014-06-17 DIAGNOSIS — G245 Blepharospasm: Secondary | ICD-10-CM | POA: Insufficient documentation

## 2014-06-17 DIAGNOSIS — G243 Spasmodic torticollis: Secondary | ICD-10-CM | POA: Diagnosis not present

## 2014-06-17 NOTE — Patient Instructions (Signed)
Will reduce myobloc

## 2014-06-17 NOTE — Progress Notes (Signed)
Myobloc injection  Indication blepharospasm and spasmodic torticollis  Movement disorder not improved with medication management and other conservative care  Informed consent was obtained after describing risks and benefits of the procedure with the patient these include bleeding bruising and infection  Myobloc dosage 2500 units in 0.5 mL. A total of 7500 units used.  Needles 30-gauge pretarsal and orbicularis oculi  27-gauge needle electrode under EMG guidance for remainder of muscles  250 units into left pretarsal  250 units into left orbicularis oculi  2500 units into the left sternocleidomastoid  500 units left scal anticus  500 units left scal medius  2000 units left splenius capitis  1500 units left trapezius  Based on EMG consider following next visit 250 left pretarsal 250 left orbicularis oculi 2000 left splenius capitis 0 left trapezius 500 left scalene anticus 500 left scalene medius 1500 left sternocleidomastoid

## 2014-07-13 DIAGNOSIS — M545 Low back pain, unspecified: Secondary | ICD-10-CM | POA: Diagnosis not present

## 2014-07-17 DIAGNOSIS — G2571 Drug induced akathisia: Secondary | ICD-10-CM

## 2014-07-17 HISTORY — DX: Drug induced akathisia: G25.71

## 2014-08-05 DIAGNOSIS — G2589 Other specified extrapyramidal and movement disorders: Secondary | ICD-10-CM | POA: Diagnosis not present

## 2014-09-03 DIAGNOSIS — M5030 Other cervical disc degeneration, unspecified cervical region: Secondary | ICD-10-CM | POA: Diagnosis not present

## 2014-09-03 DIAGNOSIS — M47817 Spondylosis without myelopathy or radiculopathy, lumbosacral region: Secondary | ICD-10-CM | POA: Diagnosis not present

## 2014-09-03 DIAGNOSIS — Z79899 Other long term (current) drug therapy: Secondary | ICD-10-CM | POA: Diagnosis not present

## 2014-09-17 DIAGNOSIS — Z23 Encounter for immunization: Secondary | ICD-10-CM | POA: Diagnosis not present

## 2014-10-21 ENCOUNTER — Other Ambulatory Visit: Payer: Self-pay

## 2014-10-21 DIAGNOSIS — Z1231 Encounter for screening mammogram for malignant neoplasm of breast: Secondary | ICD-10-CM

## 2014-11-03 ENCOUNTER — Encounter: Payer: Self-pay | Admitting: Neurology

## 2014-11-03 DIAGNOSIS — M545 Low back pain: Secondary | ICD-10-CM | POA: Diagnosis not present

## 2014-11-03 DIAGNOSIS — M47817 Spondylosis without myelopathy or radiculopathy, lumbosacral region: Secondary | ICD-10-CM | POA: Diagnosis not present

## 2014-11-03 DIAGNOSIS — M4727 Other spondylosis with radiculopathy, lumbosacral region: Secondary | ICD-10-CM | POA: Diagnosis not present

## 2014-11-04 ENCOUNTER — Ambulatory Visit
Admission: RE | Admit: 2014-11-04 | Discharge: 2014-11-04 | Disposition: A | Discharge status: Home / Self Care | Payer: Medicare Other | Source: Ambulatory Visit

## 2014-11-04 DIAGNOSIS — Z1231 Encounter for screening mammogram for malignant neoplasm of breast: Secondary | ICD-10-CM

## 2014-11-09 ENCOUNTER — Encounter: Payer: Self-pay | Admitting: Neurology

## 2014-11-15 ENCOUNTER — Encounter: Payer: Self-pay | Admitting: Physical Medicine & Rehabilitation

## 2014-11-15 ENCOUNTER — Ambulatory Visit (HOSPITAL_BASED_OUTPATIENT_CLINIC_OR_DEPARTMENT_OTHER): Payer: Medicare Other | Admitting: Physical Medicine & Rehabilitation

## 2014-11-15 ENCOUNTER — Encounter: Payer: Medicare Other | Attending: Physical Medicine & Rehabilitation

## 2014-11-15 VITALS — BP 116/68 | HR 71 | Resp 14 | Wt 164.8 lb

## 2014-11-15 DIAGNOSIS — G245 Blepharospasm: Secondary | ICD-10-CM | POA: Diagnosis not present

## 2014-11-15 DIAGNOSIS — G243 Spasmodic torticollis: Secondary | ICD-10-CM | POA: Insufficient documentation

## 2014-11-15 NOTE — Progress Notes (Signed)
  Myobloc for cervical dystonia and blepharospasm using needle EMG guidance  Dilution: 5000 Units/ml Indication: Severe Cervical Dystonia and blepharospasm which interferes with ADL  unresponsive to medication management and other conservative care Informed consent was obtained after describing risks and benefits of the procedure with the patient. This includes bleeding, bruising, infection, excessive weakness, or medication side effects. A REMS form is on file and signed. Needle: 27-gauge 1 inch needle electrode Number of units per muscle  All injections were done after obtaining appropriate EMG activity and after negative drawback for blood. The patient tolerated the procedure well. Post procedure instructions were given. A followup appointment was made.  250 left pretarsal 250 left orbicularis oculi 2000 left splenius capitis 0 left trapezius 500 left scalene anticus 500 left scalene medius 1500 left sternocleidomastoid  May consider not injecting scalenes next visit secondary to absent EMG activity Length and time between injections to 6 months

## 2014-11-15 NOTE — Patient Instructions (Addendum)
You received a Myobloc injection today. You may experience soreness at the needle injection sites. Please call us if any of the injection sites turns red after a couple days or if there is any drainage. You may experience muscle weakness as a result of myobloc. This would improve with time but can take several weeks to improve.

## 2014-11-19 ENCOUNTER — Ambulatory Visit: Payer: Medicare Other | Admitting: Physical Medicine & Rehabilitation

## 2014-11-29 DIAGNOSIS — Z79891 Long term (current) use of opiate analgesic: Secondary | ICD-10-CM | POA: Diagnosis not present

## 2014-11-29 DIAGNOSIS — M4727 Other spondylosis with radiculopathy, lumbosacral region: Secondary | ICD-10-CM | POA: Diagnosis not present

## 2014-11-29 DIAGNOSIS — M533 Sacrococcygeal disorders, not elsewhere classified: Secondary | ICD-10-CM | POA: Diagnosis not present

## 2014-11-29 DIAGNOSIS — M47812 Spondylosis without myelopathy or radiculopathy, cervical region: Secondary | ICD-10-CM | POA: Diagnosis not present

## 2014-12-13 DIAGNOSIS — H2510 Age-related nuclear cataract, unspecified eye: Secondary | ICD-10-CM | POA: Diagnosis not present

## 2014-12-15 DIAGNOSIS — M47812 Spondylosis without myelopathy or radiculopathy, cervical region: Secondary | ICD-10-CM | POA: Diagnosis not present

## 2014-12-15 DIAGNOSIS — M533 Sacrococcygeal disorders, not elsewhere classified: Secondary | ICD-10-CM | POA: Diagnosis not present

## 2014-12-17 HISTORY — PX: ROTATOR CUFF REPAIR: SHX139

## 2014-12-22 DIAGNOSIS — Z23 Encounter for immunization: Secondary | ICD-10-CM | POA: Diagnosis not present

## 2014-12-22 DIAGNOSIS — E039 Hypothyroidism, unspecified: Secondary | ICD-10-CM | POA: Diagnosis not present

## 2014-12-22 DIAGNOSIS — M549 Dorsalgia, unspecified: Secondary | ICD-10-CM | POA: Diagnosis not present

## 2014-12-22 DIAGNOSIS — N183 Chronic kidney disease, stage 3 (moderate): Secondary | ICD-10-CM | POA: Diagnosis not present

## 2014-12-22 DIAGNOSIS — G4733 Obstructive sleep apnea (adult) (pediatric): Secondary | ICD-10-CM | POA: Diagnosis not present

## 2014-12-22 DIAGNOSIS — Z0001 Encounter for general adult medical examination with abnormal findings: Secondary | ICD-10-CM | POA: Diagnosis not present

## 2014-12-22 DIAGNOSIS — R7301 Impaired fasting glucose: Secondary | ICD-10-CM | POA: Diagnosis not present

## 2014-12-22 DIAGNOSIS — K219 Gastro-esophageal reflux disease without esophagitis: Secondary | ICD-10-CM | POA: Diagnosis not present

## 2014-12-22 DIAGNOSIS — M797 Fibromyalgia: Secondary | ICD-10-CM | POA: Diagnosis not present

## 2014-12-22 DIAGNOSIS — E782 Mixed hyperlipidemia: Secondary | ICD-10-CM | POA: Diagnosis not present

## 2014-12-22 DIAGNOSIS — I129 Hypertensive chronic kidney disease with stage 1 through stage 4 chronic kidney disease, or unspecified chronic kidney disease: Secondary | ICD-10-CM | POA: Diagnosis not present

## 2014-12-22 DIAGNOSIS — Z1389 Encounter for screening for other disorder: Secondary | ICD-10-CM | POA: Diagnosis not present

## 2014-12-28 DIAGNOSIS — M5030 Other cervical disc degeneration, unspecified cervical region: Secondary | ICD-10-CM | POA: Diagnosis not present

## 2014-12-28 DIAGNOSIS — M47812 Spondylosis without myelopathy or radiculopathy, cervical region: Secondary | ICD-10-CM | POA: Diagnosis not present

## 2014-12-28 DIAGNOSIS — M542 Cervicalgia: Secondary | ICD-10-CM | POA: Diagnosis not present

## 2014-12-28 DIAGNOSIS — M4727 Other spondylosis with radiculopathy, lumbosacral region: Secondary | ICD-10-CM | POA: Diagnosis not present

## 2015-03-01 DIAGNOSIS — Z79891 Long term (current) use of opiate analgesic: Secondary | ICD-10-CM | POA: Diagnosis not present

## 2015-03-01 DIAGNOSIS — M5030 Other cervical disc degeneration, unspecified cervical region: Secondary | ICD-10-CM | POA: Diagnosis not present

## 2015-03-29 DIAGNOSIS — M5032 Other cervical disc degeneration, mid-cervical region: Secondary | ICD-10-CM | POA: Diagnosis not present

## 2015-04-27 DIAGNOSIS — M545 Low back pain: Secondary | ICD-10-CM | POA: Diagnosis not present

## 2015-04-27 DIAGNOSIS — M4727 Other spondylosis with radiculopathy, lumbosacral region: Secondary | ICD-10-CM | POA: Diagnosis not present

## 2015-04-29 DIAGNOSIS — G4733 Obstructive sleep apnea (adult) (pediatric): Secondary | ICD-10-CM | POA: Diagnosis not present

## 2015-05-09 ENCOUNTER — Encounter: Payer: Self-pay | Admitting: Physical Medicine & Rehabilitation

## 2015-05-09 ENCOUNTER — Ambulatory Visit (HOSPITAL_BASED_OUTPATIENT_CLINIC_OR_DEPARTMENT_OTHER): Payer: Medicare Other | Admitting: Physical Medicine & Rehabilitation

## 2015-05-09 ENCOUNTER — Encounter: Payer: Medicare Other | Attending: Physical Medicine & Rehabilitation

## 2015-05-09 VITALS — BP 106/54 | HR 79 | Resp 14

## 2015-05-09 DIAGNOSIS — G245 Blepharospasm: Secondary | ICD-10-CM | POA: Insufficient documentation

## 2015-05-09 DIAGNOSIS — G243 Spasmodic torticollis: Secondary | ICD-10-CM | POA: Insufficient documentation

## 2015-05-09 NOTE — Patient Instructions (Signed)
You received a Myobloc injection today for blepharospasm as well as spasmodic torticollis. We changed a couple of the injection areas based on your examination and EMG response. If this injection does not last as long as the others I would resume the previous injections We'll plan on reinjecting in 6 months but call if you need one earlier than that

## 2015-05-09 NOTE — Progress Notes (Signed)
  Myobloc for cervical dystonia and blepharospasm using needle EMG guidance  Dilution: 5000 Units/ml Indication: Severe Cervical Dystonia and blepharospasm which interferes with ADL  unresponsive to medication management and other conservative care Informed consent was obtained after describing risks and benefits of the procedure with the patient. This includes bleeding, bruising, infection, excessive weakness, or medication side effects. A REMS form is on file and signed. Needle: 27-gauge 1 inch needle electrode Number of units per muscle  All injections were done after obtaining appropriate EMG activity and after negative drawback for blood. The patient tolerated the procedure well. Post procedure instructions were given. A followup appointment was made.  0 left pretarsal 500 left orbicularis oculi 2000 left splenius capitis 1000 left trapezius upper fibers   1500 left sternocleidomastoid  May consider not injecting scalenes next visit secondary to absent EMG activity Length and time between injections to 6 months

## 2015-05-31 DIAGNOSIS — M4727 Other spondylosis with radiculopathy, lumbosacral region: Secondary | ICD-10-CM | POA: Diagnosis not present

## 2015-05-31 DIAGNOSIS — M5030 Other cervical disc degeneration, unspecified cervical region: Secondary | ICD-10-CM | POA: Diagnosis not present

## 2015-05-31 DIAGNOSIS — Z79891 Long term (current) use of opiate analgesic: Secondary | ICD-10-CM | POA: Diagnosis not present

## 2015-06-17 DIAGNOSIS — G4733 Obstructive sleep apnea (adult) (pediatric): Secondary | ICD-10-CM | POA: Diagnosis not present

## 2015-06-17 DIAGNOSIS — F322 Major depressive disorder, single episode, severe without psychotic features: Secondary | ICD-10-CM | POA: Diagnosis not present

## 2015-06-17 DIAGNOSIS — H61899 Other specified disorders of external ear, unspecified ear: Secondary | ICD-10-CM | POA: Diagnosis not present

## 2015-06-17 DIAGNOSIS — I129 Hypertensive chronic kidney disease with stage 1 through stage 4 chronic kidney disease, or unspecified chronic kidney disease: Secondary | ICD-10-CM | POA: Diagnosis not present

## 2015-06-17 DIAGNOSIS — R7301 Impaired fasting glucose: Secondary | ICD-10-CM | POA: Diagnosis not present

## 2015-06-17 DIAGNOSIS — E782 Mixed hyperlipidemia: Secondary | ICD-10-CM | POA: Diagnosis not present

## 2015-06-17 DIAGNOSIS — Z6831 Body mass index (BMI) 31.0-31.9, adult: Secondary | ICD-10-CM | POA: Diagnosis not present

## 2015-06-17 DIAGNOSIS — E669 Obesity, unspecified: Secondary | ICD-10-CM | POA: Diagnosis not present

## 2015-06-17 DIAGNOSIS — E039 Hypothyroidism, unspecified: Secondary | ICD-10-CM | POA: Diagnosis not present

## 2015-06-17 DIAGNOSIS — N183 Chronic kidney disease, stage 3 (moderate): Secondary | ICD-10-CM | POA: Diagnosis not present

## 2015-07-01 DIAGNOSIS — N179 Acute kidney failure, unspecified: Secondary | ICD-10-CM | POA: Diagnosis not present

## 2015-07-01 DIAGNOSIS — E039 Hypothyroidism, unspecified: Secondary | ICD-10-CM | POA: Diagnosis not present

## 2015-07-14 DIAGNOSIS — N179 Acute kidney failure, unspecified: Secondary | ICD-10-CM | POA: Diagnosis not present

## 2015-07-20 DIAGNOSIS — L570 Actinic keratosis: Secondary | ICD-10-CM | POA: Diagnosis not present

## 2015-07-20 DIAGNOSIS — H61002 Unspecified perichondritis of left external ear: Secondary | ICD-10-CM | POA: Diagnosis not present

## 2015-07-28 DIAGNOSIS — N179 Acute kidney failure, unspecified: Secondary | ICD-10-CM | POA: Diagnosis not present

## 2015-08-11 DIAGNOSIS — H61002 Unspecified perichondritis of left external ear: Secondary | ICD-10-CM | POA: Diagnosis not present

## 2015-08-11 DIAGNOSIS — D485 Neoplasm of uncertain behavior of skin: Secondary | ICD-10-CM | POA: Diagnosis not present

## 2015-08-12 DIAGNOSIS — L905 Scar conditions and fibrosis of skin: Secondary | ICD-10-CM | POA: Diagnosis not present

## 2015-08-12 DIAGNOSIS — L280 Lichen simplex chronicus: Secondary | ICD-10-CM | POA: Diagnosis not present

## 2015-08-17 DIAGNOSIS — N179 Acute kidney failure, unspecified: Secondary | ICD-10-CM | POA: Diagnosis not present

## 2015-08-17 DIAGNOSIS — E039 Hypothyroidism, unspecified: Secondary | ICD-10-CM | POA: Diagnosis not present

## 2015-08-26 DIAGNOSIS — N183 Chronic kidney disease, stage 3 (moderate): Secondary | ICD-10-CM | POA: Diagnosis not present

## 2015-08-30 DIAGNOSIS — M5030 Other cervical disc degeneration, unspecified cervical region: Secondary | ICD-10-CM | POA: Diagnosis not present

## 2015-08-30 DIAGNOSIS — M5136 Other intervertebral disc degeneration, lumbar region: Secondary | ICD-10-CM | POA: Diagnosis not present

## 2015-09-08 DIAGNOSIS — M47812 Spondylosis without myelopathy or radiculopathy, cervical region: Secondary | ICD-10-CM | POA: Diagnosis not present

## 2015-09-08 DIAGNOSIS — M542 Cervicalgia: Secondary | ICD-10-CM | POA: Diagnosis not present

## 2015-09-08 DIAGNOSIS — M4727 Other spondylosis with radiculopathy, lumbosacral region: Secondary | ICD-10-CM | POA: Diagnosis not present

## 2015-09-08 DIAGNOSIS — M5030 Other cervical disc degeneration, unspecified cervical region: Secondary | ICD-10-CM | POA: Diagnosis not present

## 2015-09-13 DIAGNOSIS — Z23 Encounter for immunization: Secondary | ICD-10-CM | POA: Diagnosis not present

## 2015-09-28 DIAGNOSIS — M47817 Spondylosis without myelopathy or radiculopathy, lumbosacral region: Secondary | ICD-10-CM | POA: Diagnosis not present

## 2015-11-07 ENCOUNTER — Encounter: Payer: Self-pay | Admitting: Physical Medicine & Rehabilitation

## 2015-11-07 ENCOUNTER — Encounter: Payer: Medicare Other | Attending: Physical Medicine & Rehabilitation

## 2015-11-07 ENCOUNTER — Ambulatory Visit (HOSPITAL_BASED_OUTPATIENT_CLINIC_OR_DEPARTMENT_OTHER): Payer: Medicare Other | Admitting: Physical Medicine & Rehabilitation

## 2015-11-07 VITALS — BP 122/70 | HR 74 | Resp 14

## 2015-11-07 DIAGNOSIS — G243 Spasmodic torticollis: Secondary | ICD-10-CM | POA: Insufficient documentation

## 2015-11-07 DIAGNOSIS — G245 Blepharospasm: Secondary | ICD-10-CM | POA: Insufficient documentation

## 2015-11-07 NOTE — Progress Notes (Signed)
  Myobloc for cervical dystonia and blepharospasm using needle EMG guidance  Dilution: 5000 Units/ml Indication: Severe Cervical Dystonia and blepharospasm which interferes with ADL  unresponsive to medication management and other conservative care Informed consent was obtained after describing risks and benefits of the procedure with the patient. This includes bleeding, bruising, infection, excessive weakness, or medication side effects. A REMS form is on file and signed. Needle: 27-gauge 1 inch needle electrode Number of units per muscle  All injections were done after obtaining appropriate EMG activity and after negative drawback for blood. The patient tolerated the procedure well. Post procedure instructions were given. A followup appointment was made.  0 left pretarsal 500 left orbicularis oculi 2000 left splenius capitis 1000 left trapezius upper fibers   1500 left sternocleidomastoid   Length and time between injections to 6 months

## 2016-01-04 DIAGNOSIS — M4727 Other spondylosis with radiculopathy, lumbosacral region: Secondary | ICD-10-CM | POA: Diagnosis not present

## 2016-01-04 DIAGNOSIS — G894 Chronic pain syndrome: Secondary | ICD-10-CM | POA: Diagnosis not present

## 2016-01-04 DIAGNOSIS — M542 Cervicalgia: Secondary | ICD-10-CM | POA: Diagnosis not present

## 2016-01-04 DIAGNOSIS — M47812 Spondylosis without myelopathy or radiculopathy, cervical region: Secondary | ICD-10-CM | POA: Diagnosis not present

## 2016-01-06 DIAGNOSIS — A4189 Other specified sepsis: Secondary | ICD-10-CM | POA: Diagnosis not present

## 2016-01-06 DIAGNOSIS — N179 Acute kidney failure, unspecified: Secondary | ICD-10-CM | POA: Diagnosis not present

## 2016-01-06 DIAGNOSIS — G4733 Obstructive sleep apnea (adult) (pediatric): Secondary | ICD-10-CM | POA: Diagnosis not present

## 2016-01-25 DIAGNOSIS — M47816 Spondylosis without myelopathy or radiculopathy, lumbar region: Secondary | ICD-10-CM | POA: Diagnosis not present

## 2016-01-27 DIAGNOSIS — M797 Fibromyalgia: Secondary | ICD-10-CM | POA: Diagnosis not present

## 2016-01-27 DIAGNOSIS — E782 Mixed hyperlipidemia: Secondary | ICD-10-CM | POA: Diagnosis not present

## 2016-01-27 DIAGNOSIS — Z0001 Encounter for general adult medical examination with abnormal findings: Secondary | ICD-10-CM | POA: Diagnosis not present

## 2016-01-27 DIAGNOSIS — F322 Major depressive disorder, single episode, severe without psychotic features: Secondary | ICD-10-CM | POA: Diagnosis not present

## 2016-01-27 DIAGNOSIS — R7301 Impaired fasting glucose: Secondary | ICD-10-CM | POA: Diagnosis not present

## 2016-01-27 DIAGNOSIS — I6529 Occlusion and stenosis of unspecified carotid artery: Secondary | ICD-10-CM | POA: Diagnosis not present

## 2016-01-27 DIAGNOSIS — E039 Hypothyroidism, unspecified: Secondary | ICD-10-CM | POA: Diagnosis not present

## 2016-01-27 DIAGNOSIS — I129 Hypertensive chronic kidney disease with stage 1 through stage 4 chronic kidney disease, or unspecified chronic kidney disease: Secondary | ICD-10-CM | POA: Diagnosis not present

## 2016-01-27 DIAGNOSIS — N183 Chronic kidney disease, stage 3 (moderate): Secondary | ICD-10-CM | POA: Diagnosis not present

## 2016-04-30 ENCOUNTER — Ambulatory Visit (HOSPITAL_BASED_OUTPATIENT_CLINIC_OR_DEPARTMENT_OTHER): Payer: Commercial Managed Care - HMO | Admitting: Physical Medicine & Rehabilitation

## 2016-04-30 ENCOUNTER — Encounter: Payer: Self-pay | Admitting: Physical Medicine & Rehabilitation

## 2016-04-30 ENCOUNTER — Encounter: Payer: Commercial Managed Care - HMO | Attending: Physical Medicine & Rehabilitation

## 2016-04-30 VITALS — BP 154/89 | HR 85 | Resp 14

## 2016-04-30 DIAGNOSIS — K529 Noninfective gastroenteritis and colitis, unspecified: Secondary | ICD-10-CM | POA: Diagnosis not present

## 2016-04-30 DIAGNOSIS — G243 Spasmodic torticollis: Secondary | ICD-10-CM | POA: Insufficient documentation

## 2016-04-30 DIAGNOSIS — G245 Blepharospasm: Secondary | ICD-10-CM | POA: Diagnosis not present

## 2016-04-30 DIAGNOSIS — G248 Other dystonia: Secondary | ICD-10-CM | POA: Insufficient documentation

## 2016-04-30 DIAGNOSIS — M1990 Unspecified osteoarthritis, unspecified site: Secondary | ICD-10-CM | POA: Insufficient documentation

## 2016-04-30 NOTE — Progress Notes (Signed)
  Myobloc for cervical dystonia and blepharospasm using needle EMG guidance  Dilution: 5000 Units/ml Indication: Severe Cervical Dystonia and blepharospasm which interferes with ADL  unresponsive to medication management and other conservative care Informed consent was obtained after describing risks and benefits of the procedure with the patient. This includes bleeding, bruising, infection, excessive weakness, or medication side effects. A REMS form is on file and signed. Needle: 27-gauge 1 inch needle electrode Number of units per muscle  All injections were done after obtaining appropriate EMG activity and after negative drawback for blood. The patient tolerated the procedure well. Post procedure instructions were given. A followup appointment was made.  0 left pretarsal 500 left orbicularis oculi 2000 left splenius capitis 1000 left trapezius upper fibers   1500 left sternocleidomastoid   Next injections in 61months  Based on EMG activity would recommend changing the dose as follows next visit 500 units left orbicularis oculi 2500 units left splenius capitis 1000 units left trapezius 1000 units left sternocleidomastoid

## 2016-04-30 NOTE — Patient Instructions (Signed)
Return to clinic in 4 months for repeat injection. We will reduce the dose to the sternocleidomastoid muscle and increased the dose to the splenius capitis muscle

## 2016-05-09 DIAGNOSIS — M47816 Spondylosis without myelopathy or radiculopathy, lumbar region: Secondary | ICD-10-CM | POA: Diagnosis not present

## 2016-05-25 ENCOUNTER — Other Ambulatory Visit: Payer: Self-pay | Admitting: Family Medicine

## 2016-05-25 DIAGNOSIS — Z1231 Encounter for screening mammogram for malignant neoplasm of breast: Secondary | ICD-10-CM

## 2016-06-01 DIAGNOSIS — G894 Chronic pain syndrome: Secondary | ICD-10-CM | POA: Diagnosis not present

## 2016-06-01 DIAGNOSIS — M50322 Other cervical disc degeneration at C5-C6 level: Secondary | ICD-10-CM | POA: Diagnosis not present

## 2016-06-01 DIAGNOSIS — M542 Cervicalgia: Secondary | ICD-10-CM | POA: Diagnosis not present

## 2016-06-01 DIAGNOSIS — M47816 Spondylosis without myelopathy or radiculopathy, lumbar region: Secondary | ICD-10-CM | POA: Diagnosis not present

## 2016-06-04 ENCOUNTER — Ambulatory Visit
Admission: RE | Admit: 2016-06-04 | Discharge: 2016-06-04 | Disposition: A | Discharge status: Home / Self Care | Payer: Commercial Managed Care - HMO | Source: Ambulatory Visit | Attending: Family Medicine | Admitting: Family Medicine

## 2016-06-04 DIAGNOSIS — Z1231 Encounter for screening mammogram for malignant neoplasm of breast: Secondary | ICD-10-CM | POA: Diagnosis not present

## 2016-07-03 DIAGNOSIS — M47816 Spondylosis without myelopathy or radiculopathy, lumbar region: Secondary | ICD-10-CM | POA: Diagnosis not present

## 2016-07-03 DIAGNOSIS — M5136 Other intervertebral disc degeneration, lumbar region: Secondary | ICD-10-CM | POA: Diagnosis not present

## 2016-07-03 DIAGNOSIS — M50322 Other cervical disc degeneration at C5-C6 level: Secondary | ICD-10-CM | POA: Diagnosis not present

## 2016-08-23 DIAGNOSIS — E782 Mixed hyperlipidemia: Secondary | ICD-10-CM | POA: Diagnosis not present

## 2016-08-23 DIAGNOSIS — N952 Postmenopausal atrophic vaginitis: Secondary | ICD-10-CM | POA: Diagnosis not present

## 2016-08-23 DIAGNOSIS — N183 Chronic kidney disease, stage 3 (moderate): Secondary | ICD-10-CM | POA: Diagnosis not present

## 2016-08-23 DIAGNOSIS — M549 Dorsalgia, unspecified: Secondary | ICD-10-CM | POA: Diagnosis not present

## 2016-08-23 DIAGNOSIS — E039 Hypothyroidism, unspecified: Secondary | ICD-10-CM | POA: Diagnosis not present

## 2016-08-23 DIAGNOSIS — E669 Obesity, unspecified: Secondary | ICD-10-CM | POA: Diagnosis not present

## 2016-08-23 DIAGNOSIS — F322 Major depressive disorder, single episode, severe without psychotic features: Secondary | ICD-10-CM | POA: Diagnosis not present

## 2016-08-23 DIAGNOSIS — Z23 Encounter for immunization: Secondary | ICD-10-CM | POA: Diagnosis not present

## 2016-08-23 DIAGNOSIS — I129 Hypertensive chronic kidney disease with stage 1 through stage 4 chronic kidney disease, or unspecified chronic kidney disease: Secondary | ICD-10-CM | POA: Diagnosis not present

## 2016-08-23 DIAGNOSIS — R7301 Impaired fasting glucose: Secondary | ICD-10-CM | POA: Diagnosis not present

## 2016-08-24 DIAGNOSIS — A4189 Other specified sepsis: Secondary | ICD-10-CM | POA: Diagnosis not present

## 2016-08-24 DIAGNOSIS — I129 Hypertensive chronic kidney disease with stage 1 through stage 4 chronic kidney disease, or unspecified chronic kidney disease: Secondary | ICD-10-CM | POA: Diagnosis not present

## 2016-08-24 DIAGNOSIS — M549 Dorsalgia, unspecified: Secondary | ICD-10-CM | POA: Diagnosis not present

## 2016-08-24 DIAGNOSIS — E039 Hypothyroidism, unspecified: Secondary | ICD-10-CM | POA: Diagnosis not present

## 2016-08-24 DIAGNOSIS — N179 Acute kidney failure, unspecified: Secondary | ICD-10-CM | POA: Diagnosis not present

## 2016-08-24 DIAGNOSIS — N183 Chronic kidney disease, stage 3 (moderate): Secondary | ICD-10-CM | POA: Diagnosis not present

## 2016-08-24 DIAGNOSIS — G4733 Obstructive sleep apnea (adult) (pediatric): Secondary | ICD-10-CM | POA: Diagnosis not present

## 2016-08-31 ENCOUNTER — Ambulatory Visit (HOSPITAL_BASED_OUTPATIENT_CLINIC_OR_DEPARTMENT_OTHER): Payer: Commercial Managed Care - HMO | Admitting: Physical Medicine & Rehabilitation

## 2016-08-31 ENCOUNTER — Encounter: Payer: Self-pay | Admitting: Physical Medicine & Rehabilitation

## 2016-08-31 ENCOUNTER — Encounter: Payer: Commercial Managed Care - HMO | Attending: Physical Medicine & Rehabilitation

## 2016-08-31 VITALS — BP 125/83 | HR 87 | Resp 14

## 2016-08-31 DIAGNOSIS — G248 Other dystonia: Secondary | ICD-10-CM | POA: Diagnosis not present

## 2016-08-31 DIAGNOSIS — G245 Blepharospasm: Secondary | ICD-10-CM

## 2016-08-31 DIAGNOSIS — G243 Spasmodic torticollis: Secondary | ICD-10-CM

## 2016-08-31 NOTE — Progress Notes (Signed)
Patient state not having any twitching of the left eye. No pain in the left trapezius or left side of the neck. She's had some type of injections with Dr. [name].  Walking program daily. 15 minutes per day  Cervical range of motion is 75-100% Mood and affect appropriate Her strength is 5/5 in left upper extremity.Tendernessis absentin the left trapezius, left splenis area as well as sternocleido mastoid No blepharospasm  Impression 1.  Cervical dystonia and Blepharospasm, no recurrence thus far 4 months post myobloc.  Pt will call to schedule repeat injection if symptoms reoccur

## 2016-08-31 NOTE — Patient Instructions (Signed)
Please call when/if symptoms reoccur

## 2016-09-10 DIAGNOSIS — N183 Chronic kidney disease, stage 3 (moderate): Secondary | ICD-10-CM | POA: Diagnosis not present

## 2016-09-10 DIAGNOSIS — N179 Acute kidney failure, unspecified: Secondary | ICD-10-CM | POA: Diagnosis not present

## 2016-09-13 ENCOUNTER — Encounter (HOSPITAL_COMMUNITY): Payer: Self-pay | Admitting: Emergency Medicine

## 2016-09-13 ENCOUNTER — Emergency Department (HOSPITAL_COMMUNITY): Payer: Commercial Managed Care - HMO

## 2016-09-13 ENCOUNTER — Inpatient Hospital Stay (HOSPITAL_COMMUNITY)
Admission: EM | Admit: 2016-09-13 | Discharge: 2016-09-18 | DRG: 536 | Disposition: A | Discharge status: Skilled Nursing Facility | Payer: Commercial Managed Care - HMO | Attending: Internal Medicine | Admitting: Internal Medicine

## 2016-09-13 DIAGNOSIS — R296 Repeated falls: Secondary | ICD-10-CM | POA: Diagnosis not present

## 2016-09-13 DIAGNOSIS — G243 Spasmodic torticollis: Secondary | ICD-10-CM | POA: Diagnosis present

## 2016-09-13 DIAGNOSIS — S32591K Other specified fracture of right pubis, subsequent encounter for fracture with nonunion: Secondary | ICD-10-CM | POA: Diagnosis not present

## 2016-09-13 DIAGNOSIS — Y92019 Unspecified place in single-family (private) house as the place of occurrence of the external cause: Secondary | ICD-10-CM | POA: Diagnosis not present

## 2016-09-13 DIAGNOSIS — Z882 Allergy status to sulfonamides status: Secondary | ICD-10-CM | POA: Diagnosis not present

## 2016-09-13 DIAGNOSIS — S32591A Other specified fracture of right pubis, initial encounter for closed fracture: Secondary | ICD-10-CM | POA: Diagnosis not present

## 2016-09-13 DIAGNOSIS — N183 Chronic kidney disease, stage 3 unspecified: Secondary | ICD-10-CM

## 2016-09-13 DIAGNOSIS — W010XXA Fall on same level from slipping, tripping and stumbling without subsequent striking against object, initial encounter: Secondary | ICD-10-CM | POA: Diagnosis present

## 2016-09-13 DIAGNOSIS — Z881 Allergy status to other antibiotic agents status: Secondary | ICD-10-CM

## 2016-09-13 DIAGNOSIS — I129 Hypertensive chronic kidney disease with stage 1 through stage 4 chronic kidney disease, or unspecified chronic kidney disease: Secondary | ICD-10-CM | POA: Diagnosis not present

## 2016-09-13 DIAGNOSIS — L304 Erythema intertrigo: Secondary | ICD-10-CM

## 2016-09-13 DIAGNOSIS — T148 Other injury of unspecified body region: Secondary | ICD-10-CM | POA: Diagnosis not present

## 2016-09-13 DIAGNOSIS — E038 Other specified hypothyroidism: Secondary | ICD-10-CM | POA: Diagnosis not present

## 2016-09-13 DIAGNOSIS — S32601A Unspecified fracture of right ischium, initial encounter for closed fracture: Secondary | ICD-10-CM | POA: Diagnosis not present

## 2016-09-13 DIAGNOSIS — I10 Essential (primary) hypertension: Secondary | ICD-10-CM

## 2016-09-13 DIAGNOSIS — R278 Other lack of coordination: Secondary | ICD-10-CM | POA: Diagnosis not present

## 2016-09-13 DIAGNOSIS — Z79891 Long term (current) use of opiate analgesic: Secondary | ICD-10-CM | POA: Diagnosis not present

## 2016-09-13 DIAGNOSIS — E785 Hyperlipidemia, unspecified: Secondary | ICD-10-CM | POA: Diagnosis present

## 2016-09-13 DIAGNOSIS — S329XXA Fracture of unspecified parts of lumbosacral spine and pelvis, initial encounter for closed fracture: Secondary | ICD-10-CM

## 2016-09-13 DIAGNOSIS — F329 Major depressive disorder, single episode, unspecified: Secondary | ICD-10-CM | POA: Diagnosis not present

## 2016-09-13 DIAGNOSIS — Z8249 Family history of ischemic heart disease and other diseases of the circulatory system: Secondary | ICD-10-CM | POA: Diagnosis not present

## 2016-09-13 DIAGNOSIS — S32511A Fracture of superior rim of right pubis, initial encounter for closed fracture: Secondary | ICD-10-CM

## 2016-09-13 DIAGNOSIS — M25551 Pain in right hip: Secondary | ICD-10-CM | POA: Diagnosis not present

## 2016-09-13 DIAGNOSIS — R339 Retention of urine, unspecified: Secondary | ICD-10-CM | POA: Diagnosis not present

## 2016-09-13 DIAGNOSIS — M1990 Unspecified osteoarthritis, unspecified site: Secondary | ICD-10-CM | POA: Diagnosis present

## 2016-09-13 DIAGNOSIS — F322 Major depressive disorder, single episode, severe without psychotic features: Secondary | ICD-10-CM | POA: Diagnosis not present

## 2016-09-13 DIAGNOSIS — E7800 Pure hypercholesterolemia, unspecified: Secondary | ICD-10-CM | POA: Diagnosis not present

## 2016-09-13 DIAGNOSIS — S32511G Fracture of superior rim of right pubis, subsequent encounter for fracture with delayed healing: Secondary | ICD-10-CM | POA: Diagnosis not present

## 2016-09-13 DIAGNOSIS — R41841 Cognitive communication deficit: Secondary | ICD-10-CM | POA: Diagnosis not present

## 2016-09-13 DIAGNOSIS — S32591G Other specified fracture of right pubis, subsequent encounter for fracture with delayed healing: Secondary | ICD-10-CM | POA: Diagnosis not present

## 2016-09-13 DIAGNOSIS — S32591D Other specified fracture of right pubis, subsequent encounter for fracture with routine healing: Secondary | ICD-10-CM | POA: Diagnosis not present

## 2016-09-13 DIAGNOSIS — E039 Hypothyroidism, unspecified: Secondary | ICD-10-CM

## 2016-09-13 DIAGNOSIS — F32A Depression, unspecified: Secondary | ICD-10-CM

## 2016-09-13 DIAGNOSIS — M6281 Muscle weakness (generalized): Secondary | ICD-10-CM | POA: Diagnosis not present

## 2016-09-13 DIAGNOSIS — R279 Unspecified lack of coordination: Secondary | ICD-10-CM | POA: Diagnosis not present

## 2016-09-13 DIAGNOSIS — R52 Pain, unspecified: Secondary | ICD-10-CM

## 2016-09-13 LAB — CBC WITH DIFFERENTIAL/PLATELET
BASOS ABS: 0.1 10*3/uL (ref 0.0–0.1)
BASOS PCT: 1 %
EOS ABS: 0.4 10*3/uL (ref 0.0–0.7)
Eosinophils Relative: 4 %
HCT: 35.5 % — ABNORMAL LOW (ref 36.0–46.0)
HEMOGLOBIN: 10.8 g/dL — AB (ref 12.0–15.0)
Lymphocytes Relative: 16 %
Lymphs Abs: 1.4 10*3/uL (ref 0.7–4.0)
MCH: 25.1 pg — ABNORMAL LOW (ref 26.0–34.0)
MCHC: 30.4 g/dL (ref 30.0–36.0)
MCV: 82.4 fL (ref 78.0–100.0)
Monocytes Absolute: 0.7 10*3/uL (ref 0.1–1.0)
Monocytes Relative: 8 %
NEUTROS PCT: 71 %
Neutro Abs: 6.2 10*3/uL (ref 1.7–7.7)
Platelets: 256 10*3/uL (ref 150–400)
RBC: 4.31 MIL/uL (ref 3.87–5.11)
RDW: 15.1 % (ref 11.5–15.5)
WBC: 8.8 10*3/uL (ref 4.0–10.5)

## 2016-09-13 LAB — BASIC METABOLIC PANEL
ANION GAP: 8 (ref 5–15)
BUN: 12 mg/dL (ref 6–20)
CO2: 24 mmol/L (ref 22–32)
Calcium: 9 mg/dL (ref 8.9–10.3)
Chloride: 105 mmol/L (ref 101–111)
Creatinine, Ser: 1.36 mg/dL — ABNORMAL HIGH (ref 0.44–1.00)
GFR calc Af Amer: 45 mL/min — ABNORMAL LOW (ref >60)
GFR, EST NON AFRICAN AMERICAN: 39 mL/min — AB (ref >60)
Glucose, Bld: 108 mg/dL — ABNORMAL HIGH (ref 65–99)
POTASSIUM: 4 mmol/L (ref 3.5–5.1)
Sodium: 137 mmol/L (ref 135–145)

## 2016-09-13 MED ORDER — HYDROCODONE-ACETAMINOPHEN 5-325 MG PO TABS
1.0000 | ORAL_TABLET | Freq: Three times a day (TID) | ORAL | Status: DC | PRN
  Administered 2016-09-13 – 2016-09-15 (×6): 1 via ORAL
  Filled 2016-09-13 (×5): qty 1

## 2016-09-13 MED ORDER — GLUCOSAMINE-CHONDROITIN 500-400 MG PO TABS: 2.0000 | ORAL_TABLET | Freq: Every day | ORAL | Status: DC

## 2016-09-13 MED ORDER — MORPHINE SULFATE (PF) 2 MG/ML IV SOLN
2.0000 mg | INTRAVENOUS | Status: DC | PRN
  Administered 2016-09-13 – 2016-09-18 (×16): 2 mg via INTRAVENOUS
  Filled 2016-09-13 (×16): qty 1

## 2016-09-13 MED ORDER — LEVOTHYROXINE SODIUM 25 MCG PO TABS
25.0000 ug | ORAL_TABLET | Freq: Every day | ORAL | Status: DC
  Administered 2016-09-14 – 2016-09-18 (×5): 25 ug via ORAL
  Filled 2016-09-13 (×5): qty 1

## 2016-09-13 MED ORDER — NYSTATIN 100000 UNIT/GM EX POWD
Freq: Two times a day (BID) | CUTANEOUS | Status: DC
  Administered 2016-09-13 – 2016-09-17 (×9): via TOPICAL
  Filled 2016-09-13 (×2): qty 15

## 2016-09-13 MED ORDER — POLYETHYLENE GLYCOL 3350 17 G PO PACK
17.0000 g | PACK | Freq: Every day | ORAL | Status: DC | PRN
  Filled 2016-09-13: qty 1

## 2016-09-13 MED ORDER — LURASIDONE HCL 20 MG PO TABS
60.0000 mg | ORAL_TABLET | Freq: Every day | ORAL | Status: DC
  Administered 2016-09-14 – 2016-09-18 (×5): 60 mg via ORAL
  Filled 2016-09-13 (×6): qty 3

## 2016-09-13 MED ORDER — SODIUM CHLORIDE 0.9 % IV SOLN
INTRAVENOUS | Status: DC
  Administered 2016-09-13 – 2016-09-17 (×7): via INTRAVENOUS

## 2016-09-13 MED ORDER — ONDANSETRON HCL 4 MG/2ML IJ SOLN: 4.0000 mg | Freq: Four times a day (QID) | INTRAMUSCULAR | Status: DC | PRN

## 2016-09-13 MED ORDER — ATORVASTATIN CALCIUM 20 MG PO TABS
20.0000 mg | ORAL_TABLET | Freq: Every day | ORAL | Status: DC
  Administered 2016-09-13 – 2016-09-17 (×5): 20 mg via ORAL
  Filled 2016-09-13 (×5): qty 1

## 2016-09-13 MED ORDER — SERTRALINE HCL 100 MG PO TABS
100.0000 mg | ORAL_TABLET | Freq: Every day | ORAL | Status: DC
  Administered 2016-09-13 – 2016-09-18 (×6): 100 mg via ORAL
  Filled 2016-09-13 (×6): qty 1

## 2016-09-13 MED ORDER — IRBESARTAN 150 MG PO TABS
150.0000 mg | ORAL_TABLET | Freq: Every day | ORAL | Status: DC
  Administered 2016-09-14 – 2016-09-18 (×5): 150 mg via ORAL
  Filled 2016-09-13 (×6): qty 1

## 2016-09-13 MED ORDER — AMLODIPINE BESYLATE 5 MG PO TABS
5.0000 mg | ORAL_TABLET | Freq: Every day | ORAL | Status: DC
  Administered 2016-09-13 – 2016-09-18 (×6): 5 mg via ORAL
  Filled 2016-09-13 (×6): qty 1

## 2016-09-13 MED ORDER — ENOXAPARIN SODIUM 40 MG/0.4ML ~~LOC~~ SOLN
40.0000 mg | SUBCUTANEOUS | Status: DC
  Administered 2016-09-13 – 2016-09-17 (×5): 40 mg via SUBCUTANEOUS
  Filled 2016-09-13 (×6): qty 0.4

## 2016-09-13 MED ORDER — ACETAMINOPHEN 650 MG RE SUPP: 650.0000 mg | Freq: Four times a day (QID) | RECTAL | Status: DC | PRN

## 2016-09-13 MED ORDER — ONDANSETRON HCL 4 MG PO TABS: 4.0000 mg | ORAL_TABLET | Freq: Four times a day (QID) | ORAL | Status: DC | PRN

## 2016-09-13 MED ORDER — BISACODYL 5 MG PO TBEC: 5.0000 mg | DELAYED_RELEASE_TABLET | Freq: Every day | ORAL | Status: DC | PRN

## 2016-09-13 MED ORDER — TRAZODONE HCL 50 MG PO TABS: 25.0000 mg | ORAL_TABLET | Freq: Every evening | ORAL | Status: DC | PRN

## 2016-09-13 MED ORDER — MORPHINE SULFATE (PF) 4 MG/ML IV SOLN
4.0000 mg | Freq: Once | INTRAVENOUS | Status: AC
  Administered 2016-09-13: 4 mg via INTRAVENOUS
  Filled 2016-09-13: qty 1

## 2016-09-13 MED ORDER — ACETAMINOPHEN 325 MG PO TABS: 650.0000 mg | ORAL_TABLET | Freq: Four times a day (QID) | ORAL | Status: DC | PRN

## 2016-09-13 MED ORDER — ZOLPIDEM TARTRATE 5 MG PO TABS
10.0000 mg | ORAL_TABLET | Freq: Every evening | ORAL | Status: DC | PRN
  Administered 2016-09-14: 10 mg via ORAL
  Filled 2016-09-13: qty 2

## 2016-09-13 NOTE — ED Notes (Signed)
Pt. Placed on bedpan by this RN and NT. Unsuccessful attempt to urinate. Pt. Assisted off bedpan.

## 2016-09-13 NOTE — Progress Notes (Signed)
Pharmacy to re-evaluation patient medications and what she takes for chronic back pain at home. Pt may have supplemental pain medication once pharmacy has seen her. Until then she may have the norco for pain or page PM MD on call for additional medication per Tye Savoy NP

## 2016-09-13 NOTE — Progress Notes (Signed)
[name] [id number] Admission Data: 09/13/2016 7:21 PM Attending Provider: Waldemar Dickens, MD  [id number], MD Consults/ Treatment Team:   [name] is a 70 y.o. female patient admitted from ED awake, alert  & orientated  X 3,  Partial Code, VSS - Blood pressure 122/65, pulse 80, temperature 98.4 F (36.9 C), temperature source Oral, resp. rate 16, height 5\' 2"  (1.575 m), weight 87.5 kg (193 lb), SpO2 94 %., O2  no c/o shortness of breath, no c/o chest pain, no distress noted.IV site WDL:  antecubital left, condition patent and no redness with a transparent dsg that's clean dry and intact.  Allergies:   Allergies  Allergen Reactions  . Sulfa Antibiotics      Past Medical History:  Diagnosis Date  . Arthritis   . Cervical dystonia   . Colitis   . Hypertension     History:  obtained from spouse. Tobacco/alcohol: denied none  Pt orientation to unit, room and routine. Information packet given to patient/family and safety video watched.  Admission INP armband ID verified with patient/family, and in place. SR up x 2, fall risk assessment complete with Patient and family verbalizing understanding of risks associated with falls. Pt verbalizes an understanding of how to use the call bell and to call for help before getting out of bed.  Skin, clean-dry- intact without evidence of bruising, or skin tears.   No evidence of skin break down noted on exam. color normal, vascularity normal, no rashes or suspicious lesions, no evidence of bleeding or bruising, no lesions noted, no rash, no edema, temperature normal, texture normal, mobility and turgor normal    Will cont to monitor and assist as needed.  Salley Slaughter, RN 09/13/2016 7:21 PM

## 2016-09-13 NOTE — ED Provider Notes (Signed)
[facility] DEPT Provider Note   CSN: [id number] Arrival date & time: 09/13/16  1029     History   Chief Complaint Chief Complaint  Patient presents with  . Fall  . Hip Pain    HPI [name] is a 70 y.o. female.  Patient is a 70 year old female with past medical history of cervical dystonia, hypertension. She presents for evaluation of right hip pain. She apparently fell yesterday while walking into the garage. She fell backward and landed on her buttock on the concrete floor. She has been unable to bear weight on her right hip and buttock since that time. She denies any back pain. She denies any radiation into her legs. She denies any bowel or bladder complaints.      Past Medical History:  Diagnosis Date  . Arthritis   . Cervical dystonia   . Colitis   . Hypertension     Patient Active Problem List   Diagnosis Date Noted  . Cervical dystonia   . Colitis   . Arthritis   . 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 ABDOMINAL HYSTERECTOMY    . CHOLECYSTECTOMY    . KNEE SURGERY    . ROTATOR CUFF REPAIR      OB History    No data available       Home Medications    Prior to Admission medications   Medication Sig Start Date End Date Taking? Authorizing Provider  atorvastatin (LIPITOR) 20 MG tablet Take 1 tablet by mouth Daily. 10/28/12   Historical Provider, MD  AZOR 5-20 MG per tablet Take 1 tablet by mouth Daily. 10/27/12   Historical Provider, MD  BENICAR 20 MG tablet  02/11/14   Historical Provider, MD  clonazePAM (KLONOPIN) 2 MG tablet TAKE 1 TABLET BY MOUTH TWICE A DAY 03/29/13   Charlett Blake, MD  HYDROcodone-acetaminophen (NORCO/VICODIN) 5-325 MG per tablet TAKE 1-2 TABLET BY MOUTH EVERY DAY AS NEEDED 04/29/13   Charlett Blake, MD  LATUDA 80 MG TABS Take 1 tablet by mouth Daily. 08/29/12   Historical Provider, MD  levothyroxine (SYNTHROID, LEVOTHROID) 25 MCG  tablet  06/21/13   Historical Provider, MD  MICRONIZED COLESTIPOL HCL 1 G tablet  07/05/13   Historical Provider, MD  rOPINIRole (REQUIP) 1 MG tablet Take 1 tablet (1 mg total) by mouth at bedtime. 02/23/14   Marcial Pacas, MD  sertraline (ZOLOFT) 100 MG tablet Take 1 tablet by mouth Daily. 08/29/12   Historical Provider, MD  zolpidem (AMBIEN) 10 MG tablet Take 1 tablet by mouth At bedtime as needed. 09/02/12   Historical Provider, MD    Family History Family History  Problem Relation Age of Onset  . Alzheimer's disease Mother   . Heart disease Father     Social History Social History  Substance Use Topics  . Smoking status: Never Smoker  . Smokeless tobacco: Never Used  . Alcohol use No     Allergies   Sulfa antibiotics   Review of Systems Review of Systems  All other systems reviewed and are negative.    Physical Exam Updated Vital Signs BP 129/75 (BP Location: Right Arm)   Pulse 86   Temp 98.1 F (36.7 C) (Oral)   Resp 16   SpO2 95%   Physical Exam  Constitutional: She is oriented to person, place, and time. She appears well-developed and well-nourished. No distress.  HENT:  Head: Normocephalic and atraumatic.  Neck: Normal range of motion. Neck supple.  Cardiovascular: Normal rate and regular rhythm.  Exam reveals no gallop and no friction rub.   No murmur heard. Pulmonary/Chest: Effort normal and breath sounds normal. No respiratory distress. She has no wheezes.  Abdominal: Soft. Bowel sounds are normal. She exhibits no distension. There is no tenderness.  Musculoskeletal:  The right hip appears grossly normal. There is no obvious shortening or rotation of the leg. She does have tenderness over the lateral aspect of the hip and pain with range of motion. Distal pulses are easily palpable. Motor and sensation is intact throughout the entire foot.  Neurological: She is alert and oriented to person, place, and time.  Skin: Skin is warm and dry. She is not diaphoretic.    Nursing note and vitals reviewed.    ED Treatments / Results  Labs (all labs ordered are listed, but only abnormal results are displayed) Labs Reviewed  BASIC METABOLIC PANEL  CBC WITH DIFFERENTIAL/PLATELET    EKG  EKG Interpretation None       Radiology No results found.  Procedures Procedures (including critical care time)  Medications Ordered in ED Medications - No data to display   Initial Impression / Assessment and Plan / ED Course  I have reviewed the triage vital signs and the nursing notes.  Pertinent labs & imaging results that were available during my care of the patient were reviewed by me and considered in my medical decision making (see chart for details).  Clinical Course    X-rays reveal fractures of the superior and inferior ramus, however no hip fracture. Due to the patient's inability to ambulate, I feel as though she will require admission for pain control and physical therapy to work with her. She will be admitted to the hospitalist service.  Final Clinical Impressions(s) / ED Diagnoses   Final diagnoses:  None    New Prescriptions New Prescriptions   No medications on file     Veryl Speak, MD 09/13/16 1455

## 2016-09-13 NOTE — ED Notes (Signed)
Placed order for pt. Lunch tray 

## 2016-09-13 NOTE — ED Triage Notes (Signed)
Pt to ER BIB [facility] from home for evaluation of right hip pain after falling last night to right side. No deformity noted. Pain with movement. Received 100 mcg of fentanyl in route. VSS. A/o x4. No LOC with fall. Hx of chronic back pain takes medication daily for pain but has not relieved any of her new hip pain.

## 2016-09-13 NOTE — H&P (Signed)
History and Physical    [name] [id number] DOB: [birth year] DOA: 09/13/2016  PCP: [name], MD  Patient coming from:   Home   Chief Complaint:  Right hip pain  HPI: [name] is a 70 y.o. female with a medical history significant for chronic back pain, HTN and hypothyroidism. Patient tripped over her shoe last night, fell on concrete on her right buttock. No loss of consciousness. No preceding dizziness. Family drug her on a rug to the bed. Patient has tried ambulate to the bathroom but is unable to bear weight on her right foot. It is even difficult for her to sit up in bed. Patient takes percocet tid for arthritis. Yesterday husband added Motrin with each dose. Patient still with significant pain.   ED Course:  Afebrile, hemodynamically stable. Oxygen saturations normal on room air Chem profile  Review of Systems: As per HPI, otherwise 10 point review of systems negative.    Past Medical History:  Diagnosis Date  . Arthritis   . Cervical dystonia   . Colitis   . Hypertension     Past Surgical History:  Procedure Laterality Date  . ABDOMINAL HYSTERECTOMY    . CHOLECYSTECTOMY    . KNEE SURGERY    . ROTATOR CUFF REPAIR      Social History   Social History  . Marital status: Married    Spouse name: Ronalee Belts  . Number of children: 3  . Years of education: college   Occupational History  .  Retired    retired   Social History Main Topics  . Smoking status: Never Smoker  . Smokeless tobacco: Never Used  . Alcohol use No  . Drug use: No  . Sexual activity: Not on file   Other Topics Concern  . Not on file   Social History Narrative   Patient lives at home with her husband Ronalee Belts)    Retired    Right handed   Some college   Caffeine three cups daily    Allergies  Allergen Reactions  . Sulfa Antibiotics     Family History  Problem Relation Age of Onset  . Alzheimer's disease Mother   . Heart disease Father      Prior to  Admission medications   Medication Sig Start Date End Date Taking? Authorizing Provider  atorvastatin (LIPITOR) 20 MG tablet Take 1 tablet by mouth Daily. 10/28/12   Historical Provider, MD  AZOR 5-20 MG per tablet Take 1 tablet by mouth Daily. 10/27/12   Historical Provider, MD  BENICAR 20 MG tablet  02/11/14   Historical Provider, MD  clonazePAM (KLONOPIN) 2 MG tablet TAKE 1 TABLET BY MOUTH TWICE A DAY 03/29/13   Charlett Blake, MD  HYDROcodone-acetaminophen (NORCO/VICODIN) 5-325 MG per tablet TAKE 1-2 TABLET BY MOUTH EVERY DAY AS NEEDED 04/29/13   Charlett Blake, MD  LATUDA 80 MG TABS Take 1 tablet by mouth Daily. 08/29/12   Historical Provider, MD  levothyroxine (SYNTHROID, LEVOTHROID) 25 MCG tablet  06/21/13   Historical Provider, MD  MICRONIZED COLESTIPOL HCL 1 G tablet  07/05/13   Historical Provider, MD  sertraline (ZOLOFT) 100 MG tablet Take 1 tablet by mouth Daily. 08/29/12   Historical Provider, MD  zolpidem (AMBIEN) 10 MG tablet Take 1 tablet by mouth At bedtime as needed. 09/02/12   Historical Provider, MD    Physical Exam: Vitals:   09/13/16 1045 09/13/16 1100 09/13/16 1115 09/13/16 1246  BP: 133/78 130/70 141/82 139/72  Pulse: 84  81 81 84  Resp:    17  Temp:      TempSrc:      SpO2: 96% 95% 96% 96%    Constitutional:  Obese, white female in NAD, calm, comfortable Vitals:   09/13/16 1045 09/13/16 1100 09/13/16 1115 09/13/16 1246  BP: 133/78 130/70 141/82 139/72  Pulse: 84 81 81 84  Resp:    17  Temp:      TempSrc:      SpO2: 96% 95% 96% 96%   Eyes: PER, lids and conjunctivae normal ENMT: Mucous membranes are moist. Posterior pharynx clear of any exudate or lesions..  Neck: normal, supple, no masses Respiratory: Suboptimal exam . Patient lying flat in bed, it hurts to turn on either side or sit up . No wheezing heard. No accessory muscle use.  Cardiovascular: Regular rate and rhythm, no murmurs / rubs / gallops. No extremity edema. 2+ dorsal pedis pulses.     Abdomen: Soft, obese, no tenderness, no masses palpated. No hepatomegaly. Bowel sounds positive.  Musculoskeletal: no clubbing / cyanosis. No joint deformity upper and lower extremities. Good ROM, no contractures. Normal muscle tone.  Skin: Right groin fold erythematous  Neurologic: CN 2-12 grossly intact. Sensation intact, Strength 5/5 in all 4.  Psychiatric: Normal judgment and insight. Alert and oriented x 3. Normal mood.    Labs on Admission: I have personally reviewed following labs and imaging studies  Radiological Exams on Admission: Dg Hip Unilat W Or Wo Pelvis 2-3 Views Right  Result Date: 09/13/2016 CLINICAL DATA:  [place] last night, now with right hip pain EXAM: DG HIP (WITH OR WITHOUT PELVIS) 2-3V RIGHT COMPARISON:  None. 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 No right hip fracture is seen. However, there are minimally displaced fractures of the right superior ischial ramus and right inferior pubic ramus. The SI joints are corticated. The bowel gas pattern is nonspecific. IMPRESSION: 1. Acute fractures of the right superior ischial ramus and right inferior pubic ramus. 2. No hip fracture is seen. Moderate degenerative change is present in the hips, right greater than left. Electronically Signed   By: Ivar Drape M.D.   On: 09/13/2016 11:52    Assessment/Plan   Active Problems:   Fracture of right superior pubic ramus (HCC)   Fracture of right inferior pubic ramus (HCC)   Cervical dystonia   Arthritis   Hypothyroid   Depression   Intertrigo      Fracture of right superior pubic ramus and right inferior pubic ramus following mechanical fall at home yesterday evening. Unable to bear weight on right leg. In significant pain when not lying flat.  -Place in OBS for pain control. Patient already on chronic percocet TID for arthritis managed outpatient by Ortho.. Will continue home Percocet TID and add additional narcotic for breakthrough pain.            -PT evaluation -May need Rehab at discharge   Hypothyroidism -continue home synthroid .  CKD, stage 3. Baseline Cr 1.12, mildly elevated to 1.36.  -avoid nephrotoxic medications . -am bmet  HTN., BP controlled in ED.   depression.  . -continue home anti-depressant  Arthritis, on chronic narcotics under direction of echo. She was seeing Dr. Alysia Penna for pain management of cervical dystonia but this seems to be in remission   -continue home pain management regimen.   .  Intertrigo, right groin. Asymptomatic -Nystatin powder  DVT prophylaxis:   Lovenox   Code Status:     Do not intubate Family Communication:    Treatment plan discussed with husband in the room and understands and agrees with the plan..  Disposition Plan:   Discharge home, or more likely to Rehab in 24-48 hours             Consults called:  None  Admission status:   Observation - Medical   Tye Savoy NP [facility] Pager [phone]  If 7PM-7AM, please contact night-coverage [email or web address] Password [email or web address]  09/13/2016, 12:52 PM

## 2016-09-13 NOTE — ED Notes (Signed)
Hospitalist at bedside 

## 2016-09-14 DIAGNOSIS — N183 Chronic kidney disease, stage 3 unspecified: Secondary | ICD-10-CM

## 2016-09-14 DIAGNOSIS — S32511G Fracture of superior rim of right pubis, subsequent encounter for fracture with delayed healing: Secondary | ICD-10-CM | POA: Diagnosis not present

## 2016-09-14 DIAGNOSIS — S32591G Other specified fracture of right pubis, subsequent encounter for fracture with delayed healing: Secondary | ICD-10-CM | POA: Diagnosis not present

## 2016-09-14 DIAGNOSIS — S32591D Other specified fracture of right pubis, subsequent encounter for fracture with routine healing: Secondary | ICD-10-CM | POA: Diagnosis not present

## 2016-09-14 DIAGNOSIS — R52 Pain, unspecified: Secondary | ICD-10-CM

## 2016-09-14 DIAGNOSIS — S32591K Other specified fracture of right pubis, subsequent encounter for fracture with nonunion: Secondary | ICD-10-CM | POA: Diagnosis not present

## 2016-09-14 DIAGNOSIS — E038 Other specified hypothyroidism: Secondary | ICD-10-CM | POA: Diagnosis not present

## 2016-09-14 DIAGNOSIS — I10 Essential (primary) hypertension: Secondary | ICD-10-CM | POA: Diagnosis not present

## 2016-09-14 LAB — CBC
HCT: 32.8 % — ABNORMAL LOW (ref 36.0–46.0)
HEMOGLOBIN: 10 g/dL — AB (ref 12.0–15.0)
MCH: 24.9 pg — AB (ref 26.0–34.0)
MCHC: 30.5 g/dL (ref 30.0–36.0)
MCV: 81.6 fL (ref 78.0–100.0)
Platelets: 253 10*3/uL (ref 150–400)
RBC: 4.02 MIL/uL (ref 3.87–5.11)
RDW: 15 % (ref 11.5–15.5)
WBC: 8.4 10*3/uL (ref 4.0–10.5)

## 2016-09-14 LAB — BASIC METABOLIC PANEL
ANION GAP: 7 (ref 5–15)
BUN: 9 mg/dL (ref 6–20)
CALCIUM: 8.6 mg/dL — AB (ref 8.9–10.3)
CO2: 23 mmol/L (ref 22–32)
CREATININE: 1.19 mg/dL — AB (ref 0.44–1.00)
Chloride: 109 mmol/L (ref 101–111)
GFR calc Af Amer: 53 mL/min — ABNORMAL LOW (ref >60)
GFR, EST NON AFRICAN AMERICAN: 45 mL/min — AB (ref >60)
GLUCOSE: 109 mg/dL — AB (ref 65–99)
Potassium: 3.5 mmol/L (ref 3.5–5.1)
Sodium: 139 mmol/L (ref 135–145)

## 2016-09-14 MED ORDER — SENNA 8.6 MG PO TABS
2.0000 | ORAL_TABLET | Freq: Every day | ORAL | Status: DC
  Administered 2016-09-14 – 2016-09-16 (×3): 17.2 mg via ORAL
  Filled 2016-09-14 (×6): qty 2

## 2016-09-14 MED ORDER — POLYETHYLENE GLYCOL 3350 17 G PO PACK
17.0000 g | PACK | Freq: Every day | ORAL | Status: DC
  Administered 2016-09-14 – 2016-09-16 (×3): 17 g via ORAL
  Filled 2016-09-14 (×5): qty 1

## 2016-09-14 NOTE — Care Management Note (Signed)
Case Management Note  Patient Details  Name: [name] MRN: [id number] Date of Birth: Apr 29, 1946  Subjective/Objective:                 Admitted s/p fall, suffered a Right superior ischial ramus/right inferior pubic ramus fracture. From home with husband. PTA independent with ADL's. Owns walker.  PCP: [name]  Action/Plan:  Plan is to d/c to SNF/Rehab. when medically stable.   .  Expected Discharge Date:                  Expected Discharge Plan:  [place]  In-House Referral:  Clinical Social Work  Discharge planning Services  CM Consult  Post Acute Care Choice:    Choice offered to:     DME Arranged:    DME Agency:     HH Arranged:    [place] Agency:     Status of Service:  In process, will continue to follow  If discussed at Long Length of Stay Meetings, dates discussed:    Additional Comments:  Sharin Mons, RN 09/14/2016, 3:43 PM

## 2016-09-14 NOTE — Progress Notes (Addendum)
PROGRESS NOTE  [name] [id number] DOB: [birth year] DOA: 09/13/2016 PCP: [name], MD  Brief History:  70 year old female with a history of chronic back pain who normally sees [name], hypertension, hypothyroidism, depression presented after a mechanical fall on 09/12/2016. The patient was trying to put on her shoe when she tripped over a shoe resulting in a mechanical fall. There was no prodrome symptoms or loss of consciousness. The patient had to roll the patient onto rug and subsequently dragged her to the bed. The patient was unable to bear weight or ambulate. EMS was activated. Unfortunately, the patient continued to have uncontrolled pain, and the patient was admitted for pain control and physical therapy.  Assessment/Plan: Right superior ischial ramus/right inferior pubic ramus fracture -case discussed with [name], pt's PM&R physician -discussed with Ortho-- [name] management-->WBAT -pt still with uncontrolled pain requiring IV morphine -PT eval -start cathartics  CKD stage III -Baseline creatinine of 1-1.3  Hypertension -Continue amlodipine and ARB with a therapeutic substitution  Depression -Continue Zoloft and Latuda  Hypothyroidism -Continue Synthroid  Hyperlipidemia -Continue atorvastatin   Disposition Plan:   SNF on 9/30 if stable Family Communication:  No Family at bedside  Consultants:  Drs. Ramos and [name] by phone  Code Status:  FULL  DVT Prophylaxis:  [place] Lovenox   Procedures: As Listed in Progress Note Above  Antibiotics: None    Subjective: Patient states the pain is controlled with intravenous morphine.  Patient denies fevers, chills, headache, chest pain, dyspnea, nausea, vomiting, diarrhea, abdominal pain, dysuria, hematuria, hematochezia, and melena.   Objective: Vitals:   09/13/16 1746 09/13/16 2051 09/14/16 0456 09/14/16 1303  BP:  (!) 144/66 (!) 147/92 129/67    Pulse:  96 96 88  Resp:  18  16  Temp:  97.7 F (36.5 C) 98.2 F (36.8 C) 98.6 F (37 C)  TempSrc:  Oral Oral Oral  SpO2:  91%  94%  Weight: 87.5 kg (193 lb)  88.6 kg (195 lb 5.2 oz)   Height: 5\' 2"  (1.575 m)       Intake/Output Summary (Last 24 hours) at 09/14/16 1356 Last data filed at 09/14/16 1304  Gross per 24 hour  Intake              390 ml  Output             1400 ml  Net            -1010 ml   Weight change:  Exam:   General:  Pt is alert, follows commands appropriately, not in acute distress  HEENT: No icterus, No thrush, No neck mass, [place]/AT  Cardiovascular: RRR, S1/S2, no rubs, no gallops  Respiratory: CTA bilaterally, no wheezing, no crackles, no rhonchi  Abdomen: Soft/+BS, non tender, non distended, no guarding  Extremities: No edema, No lymphangitis, No petechiae, No rashes, no synovitis   Data Reviewed: I have personally reviewed following labs and imaging studies Basic Metabolic Panel:  Recent Labs Lab 09/13/16 1112 09/14/16 0510  NA 137 139  K 4.0 3.5  CL 105 109  CO2 24 23  GLUCOSE 108* 109*  BUN 12 9  CREATININE 1.36* 1.19*  CALCIUM 9.0 8.6*   Liver Function Tests: No results for input(s): AST, ALT, ALKPHOS, BILITOT, PROT, ALBUMIN in the last 168 hours. No results for input(s): LIPASE, AMYLASE in the last 168 hours. No results for input(s): AMMONIA in the last 168  hours. Coagulation Profile: No results for input(s): INR, PROTIME in the last 168 hours. CBC:  Recent Labs Lab 09/13/16 1112 09/14/16 0510  WBC 8.8 8.4  NEUTROABS 6.2  --   HGB 10.8* 10.0*  HCT 35.5* 32.8*  MCV 82.4 81.6  PLT 256 253   Cardiac Enzymes: No results for input(s): CKTOTAL, CKMB, CKMBINDEX, TROPONINI in the last 168 hours. BNP: Invalid input(s): POCBNP CBG: No results for input(s): GLUCAP in the last 168 hours. HbA1C: No results for input(s): HGBA1C in the last 72 hours. Urine analysis:    Component Value Date/Time   COLORURINE YELLOW  01/08/2011 1035   APPEARANCEUR CLEAR 01/08/2011 1035   LABSPEC 1.015 01/08/2011 1035   PHURINE 5.5 01/08/2011 1035   GLUCOSEU NEGATIVE 09/30/2010 1323   HGBUR NEGATIVE 01/08/2011 [place] 01/08/2011 1035   KETONESUR NEGATIVE 01/08/2011 1035   PROTEINUR NEGATIVE 01/08/2011 1035   UROBILINOGEN 0.2 01/08/2011 1035   NITRITE NEGATIVE 01/08/2011 1035   LEUKOCYTESUR  01/08/2011 1035    NEGATIVE MICROSCOPIC NOT DONE ON URINES WITH NEGATIVE PROTEIN, BLOOD, LEUKOCYTES, NITRITE, OR GLUCOSE <1000 mg/dL.   Sepsis Labs: @LABRCNTIP (procalcitonin:4,lacticidven:4) )No results found for this or any previous visit (from the past 240 hour(s)).   Scheduled Meds: . amLODipine  5 mg Oral Daily  . atorvastatin  20 mg Oral q1800  . enoxaparin (LOVENOX) injection  40 mg Subcutaneous Q24H  . irbesartan  150 mg Oral Daily  . levothyroxine  25 mcg Oral QAC breakfast  . lurasidone  60 mg Oral Daily  . nystatin   Topical BID  . sertraline  100 mg Oral Daily   Continuous Infusions: . sodium chloride 75 mL/hr at 09/14/16 [id number]    Procedures/Studies: Dg Hip Unilat W Or Wo Pelvis 2-3 Views Right  Result Date: 09/13/2016 CLINICAL DATA:  [place] last night, now with right hip pain EXAM: DG HIP (WITH OR WITHOUT PELVIS) 2-3V RIGHT COMPARISON:  None. 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 No right hip fracture is seen. However, there are minimally displaced fractures of the right superior ischial ramus and right inferior pubic ramus. The SI joints are corticated. The bowel gas pattern is nonspecific. IMPRESSION: 1. Acute fractures of the right superior ischial ramus and right inferior pubic ramus. 2. No hip fracture is seen. Moderate degenerative change is present in the hips, right greater than left. Electronically Signed   By: Ivar Drape M.D.   On: 09/13/2016 11:52    Cabrini Ruggieri, DO  [facility] Pager [phone]  If 7PM-7AM, please contact  night-coverage [email or web address] Password [email or web address] 09/14/2016, 1:56 PM   LOS: 0 days

## 2016-09-15 DIAGNOSIS — S32511G Fracture of superior rim of right pubis, subsequent encounter for fracture with delayed healing: Secondary | ICD-10-CM | POA: Diagnosis not present

## 2016-09-15 DIAGNOSIS — E785 Hyperlipidemia, unspecified: Secondary | ICD-10-CM | POA: Diagnosis present

## 2016-09-15 DIAGNOSIS — Z881 Allergy status to other antibiotic agents status: Secondary | ICD-10-CM | POA: Diagnosis not present

## 2016-09-15 DIAGNOSIS — E039 Hypothyroidism, unspecified: Secondary | ICD-10-CM | POA: Diagnosis present

## 2016-09-15 DIAGNOSIS — W010XXA Fall on same level from slipping, tripping and stumbling without subsequent striking against object, initial encounter: Secondary | ICD-10-CM | POA: Diagnosis present

## 2016-09-15 DIAGNOSIS — S32591A Other specified fracture of right pubis, initial encounter for closed fracture: Secondary | ICD-10-CM | POA: Diagnosis present

## 2016-09-15 DIAGNOSIS — S32591D Other specified fracture of right pubis, subsequent encounter for fracture with routine healing: Secondary | ICD-10-CM | POA: Diagnosis not present

## 2016-09-15 DIAGNOSIS — Y92019 Unspecified place in single-family (private) house as the place of occurrence of the external cause: Secondary | ICD-10-CM | POA: Diagnosis not present

## 2016-09-15 DIAGNOSIS — S329XXA Fracture of unspecified parts of lumbosacral spine and pelvis, initial encounter for closed fracture: Secondary | ICD-10-CM | POA: Diagnosis present

## 2016-09-15 DIAGNOSIS — Z8249 Family history of ischemic heart disease and other diseases of the circulatory system: Secondary | ICD-10-CM | POA: Diagnosis not present

## 2016-09-15 DIAGNOSIS — Z882 Allergy status to sulfonamides status: Secondary | ICD-10-CM | POA: Diagnosis not present

## 2016-09-15 DIAGNOSIS — R339 Retention of urine, unspecified: Secondary | ICD-10-CM | POA: Diagnosis present

## 2016-09-15 DIAGNOSIS — S32591K Other specified fracture of right pubis, subsequent encounter for fracture with nonunion: Secondary | ICD-10-CM | POA: Diagnosis not present

## 2016-09-15 DIAGNOSIS — E038 Other specified hypothyroidism: Secondary | ICD-10-CM | POA: Diagnosis not present

## 2016-09-15 DIAGNOSIS — N183 Chronic kidney disease, stage 3 (moderate): Secondary | ICD-10-CM | POA: Diagnosis present

## 2016-09-15 DIAGNOSIS — F329 Major depressive disorder, single episode, unspecified: Secondary | ICD-10-CM | POA: Diagnosis present

## 2016-09-15 DIAGNOSIS — Z79891 Long term (current) use of opiate analgesic: Secondary | ICD-10-CM | POA: Diagnosis not present

## 2016-09-15 DIAGNOSIS — L304 Erythema intertrigo: Secondary | ICD-10-CM | POA: Diagnosis present

## 2016-09-15 DIAGNOSIS — S32591G Other specified fracture of right pubis, subsequent encounter for fracture with delayed healing: Secondary | ICD-10-CM | POA: Diagnosis not present

## 2016-09-15 DIAGNOSIS — I129 Hypertensive chronic kidney disease with stage 1 through stage 4 chronic kidney disease, or unspecified chronic kidney disease: Secondary | ICD-10-CM | POA: Diagnosis present

## 2016-09-15 DIAGNOSIS — I10 Essential (primary) hypertension: Secondary | ICD-10-CM | POA: Diagnosis not present

## 2016-09-15 LAB — MAGNESIUM: Magnesium: 1.9 mg/dL (ref 1.7–2.4)

## 2016-09-15 LAB — BASIC METABOLIC PANEL
Anion gap: 9 (ref 5–15)
BUN: 8 mg/dL (ref 6–20)
CHLORIDE: 107 mmol/L (ref 101–111)
CO2: 23 mmol/L (ref 22–32)
CREATININE: 1 mg/dL (ref 0.44–1.00)
Calcium: 8.8 mg/dL — ABNORMAL LOW (ref 8.9–10.3)
GFR calc Af Amer: >60 mL/min (ref >60)
GFR calc non Af Amer: 56 mL/min — ABNORMAL LOW (ref >60)
GLUCOSE: 122 mg/dL — AB (ref 65–99)
POTASSIUM: 3.5 mmol/L (ref 3.5–5.1)
Sodium: 139 mmol/L (ref 135–145)

## 2016-09-15 LAB — GLUCOSE, CAPILLARY: Glucose-Capillary: 110 mg/dL — ABNORMAL HIGH (ref 65–99)

## 2016-09-15 MED ORDER — SENNA 8.6 MG PO TABS: 2.0000 | ORAL_TABLET | Freq: Every day | ORAL | 0 refills | Status: DC

## 2016-09-15 MED ORDER — HYDROCODONE-ACETAMINOPHEN 5-325 MG PO TABS: 1.0000 | ORAL_TABLET | Freq: Four times a day (QID) | ORAL | 0 refills | Status: DC | PRN

## 2016-09-15 MED ORDER — ZOLPIDEM TARTRATE 5 MG PO TABS: 5.0000 mg | ORAL_TABLET | Freq: Every evening | ORAL | 0 refills | Status: DC | PRN

## 2016-09-15 MED ORDER — HYDROCODONE-ACETAMINOPHEN 5-325 MG PO TABS
2.0000 | ORAL_TABLET | Freq: Three times a day (TID) | ORAL | Status: DC | PRN
  Administered 2016-09-15 – 2016-09-16 (×3): 2 via ORAL
  Filled 2016-09-15 (×4): qty 2

## 2016-09-15 MED ORDER — POLYETHYLENE GLYCOL 3350 17 G PO PACK: 17.0000 g | PACK | Freq: Every day | ORAL | 0 refills | Status: DC

## 2016-09-15 MED ORDER — ZOLPIDEM TARTRATE 5 MG PO TABS
5.0000 mg | ORAL_TABLET | Freq: Every evening | ORAL | Status: DC | PRN
  Administered 2016-09-15 – 2016-09-17 (×3): 5 mg via ORAL
  Filled 2016-09-15 (×3): qty 1

## 2016-09-15 MED ORDER — HYDROCODONE-ACETAMINOPHEN 5-325 MG PO TABS
2.0000 | ORAL_TABLET | Freq: Once | ORAL | Status: AC
  Administered 2016-09-15: 2 via ORAL

## 2016-09-15 MED ORDER — ZOLPIDEM TARTRATE 5 MG PO TABS: 10.0000 mg | ORAL_TABLET | Freq: Every evening | ORAL | 0 refills | Status: DC | PRN

## 2016-09-15 NOTE — Progress Notes (Signed)
Physical Therapy Evaluation Patient Details Name: [name] MRN: [id number] DOB: [birth year] Today's Date: 09/15/2016   History of Present Illness  70 yo female, post mechanical fall with R ischial ramus and R pubic ramus fractures, non-displaced  Clinical Impression  Patient limited by acute pain, willing to participate with therapy.  MAX assist for bed mobility and standing at edge of bed.  Unable to fully perform pivot transfer on evaluation.  Education with patient and spouse for general LE exercises to decrease pain and improve ROM/mobility.  Patient has potential to improve and will remain on PT roster, may need SNF stay during acute pain cycle.    Follow Up Recommendations SNF;Supervision/Assistance - 24 hour    Equipment Recommendations  None recommended by PT    Recommendations for Other Services OT consult     Precautions / Restrictions Precautions Precautions: Fall Restrictions Weight Bearing Restrictions: No Other Position/Activity Restrictions: WBAT R LE      Mobility  Bed Mobility Overal bed mobility: Needs Assistance Bed Mobility: Supine to Sit;Sit to Supine     Supine to sit: Mod assist Sit to supine: Max assist   General bed mobility comments: Slow, cues for technique, head of bed elevated.  Transfers Overall transfer level: Needs assistance Equipment used: Rolling walker (2 wheeled) Transfers: Sit to/from [facility] Sit to Stand: Max assist Stand pivot transfers: Max assist;Total assist       General transfer comment: Mild pivot only, difficulty bearing weight on Rt leg to allow Left foot movement.  Ambulation/Gait Ambulation/Gait assistance: Total assist Ambulation Distance (Feet): 0 Feet            Stairs            Wheelchair Mobility    Modified Rankin (Stroke Patients Only)       Balance Overall balance assessment: Needs assistance Sitting-balance support: Single extremity supported Sitting  balance-Leahy Scale: Fair     Standing balance support: Bilateral upper extremity supported Standing balance-Leahy Scale: Poor                               Pertinent Vitals/Pain Pain Assessment: Faces Faces Pain Scale: Hurts little more Pain Location: Right Hip Pain Descriptors / Indicators: Aching;Sharp;Sore Pain Intervention(s): Limited activity within patient's tolerance;Repositioned;Premedicated before session    Home Living Family/patient expects to be discharged to:: Skilled nursing facility Living Arrangements: Spouse/significant other                    Prior Function Level of Independence: Independent with assistive device(s)         Comments: uses RW at baseline     Hand Dominance        Extremity/Trunk Assessment   Upper Extremity Assessment: Overall WFL for tasks assessed           Lower Extremity Assessment: RLE deficits/detail RLE Deficits / Details: Limited ROM and weight bearing by pain       Communication   Communication: No difficulties  Cognition Arousal/Alertness: Awake/alert Behavior During Therapy: WFL for tasks assessed/performed Overall Cognitive Status: Within Functional Limits for tasks assessed                      General Comments      Exercises General Exercises - Lower Extremity Ankle Circles/Pumps: AROM;Both;10 reps;Supine Short Arc Quad: AAROM;Both;5 reps;Supine Heel Slides: AAROM;Both;10 reps;Supine Hip ABduction/ADduction: AAROM;Both;5 reps;Supine   Assessment/Plan  PT Assessment Patient needs continued PT services  PT Problem List Decreased range of motion;Decreased activity tolerance;Decreased mobility;Decreased knowledge of precautions;Pain          PT Treatment Interventions      PT Goals (Current goals can be found in the Care Plan section)  Acute Rehab PT Goals Patient Stated Goal: Move again, go visit mother in [place] PT Goal Formulation: With patient Time For Goal  Achievement: 09/29/16 Potential to Achieve Goals: Good    Frequency Min 5X/week   Barriers to discharge        Co-evaluation               End of Session Equipment Utilized During Treatment: Gait belt Activity Tolerance: No increased pain;Patient tolerated treatment well Patient left: in bed;with call bell/phone within reach;with bed alarm set;with family/visitor present Nurse Communication: Mobility status    Functional Assessment Tool Used: Clinical Judgement Functional Limitation: Mobility: Walking and moving around Mobility: Walking and Moving Around Current Status [id number]): At least 80 percent but less than 100 percent impaired, limited or restricted Mobility: Walking and Moving Around Goal Status [phone]): At least 1 percent but less than 20 percent impaired, limited or restricted    Time: 1410-1442 PT Time Calculation (min) (ACUTE ONLY): 32 min   Charges:   PT Evaluation $PT Eval Low Complexity: 1 Procedure PT Treatments $Therapeutic Exercise: 8-22 mins   PT G Codes:   PT G-Codes **NOT FOR INPATIENT CLASS** Functional Assessment Tool Used: Clinical Judgement Functional Limitation: Mobility: Walking and moving around Mobility: Walking and Moving Around Current Status [id number]): At least 80 percent but less than 100 percent impaired, limited or restricted Mobility: Walking and Moving Around Goal Status [id number]): At least 1 percent but less than 20 percent impaired, limited or restricted    Zenia Resides, Vashon Riordan L 09/15/2016, 3:07 PM

## 2016-09-15 NOTE — Progress Notes (Signed)
Pt offered to be repositioned multiple times, pt refused,  willing to stay in supine position.

## 2016-09-15 NOTE — NC FL2 (Signed)
[place] MEDICAID FL2 LEVEL OF CARE SCREENING TOOL     IDENTIFICATION  Patient Name: [name] Birthdate: 07-Dec-1946 Sex: female Admission Date (Current Location): 09/13/2016  [facility] and [place] Number:  Herbalist and Address:  [facility] [place], [place] [address] [place],  [address]      Provider Number: [id number]  Attending Physician Name and Address:  Orson Eva, MD  Relative Name and Phone Number:  Raina Mina, [phone]    Current Level of Care: Hospital Recommended Level of Care: [place] Prior Approval Number:    Date Approved/Denied:   PASRR Number:    Discharge Plan: SNF    Current Diagnoses: Patient Active Problem List   Diagnosis Date Noted  . Uncontrolled pain 09/14/2016  . CKD (chronic kidney disease) stage 3, GFR 30-59 ml/min 09/14/2016  . Fracture of right superior pubic ramus ([place]) 09/13/2016  . Fracture of right inferior pubic ramus ([place]) 09/13/2016  . Hypothyroid 09/13/2016  . Depression 09/13/2016  . Intertrigo 09/13/2016  . Closed fracture of right inferior pubic ramus ([place]) 09/13/2016  . Essential hypertension   . Cervical dystonia   . Colitis   . Arthritis   . 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 Blepharospasm 03/11/2012    Orientation RESPIRATION BLADDER Height & Weight     Self, Time, Situation, Place  Normal Continent Weight: 88.6 kg (195 lb 5.2 oz) Height:  5\' 2"  (157.5 cm)  BEHAVIORAL SYMPTOMS/MOOD NEUROLOGICAL BOWEL NUTRITION STATUS      Continent Diet (Please see DC Summary)  AMBULATORY STATUS COMMUNICATION OF NEEDS Skin   Limited Assist Verbally Normal                       Personal Care Assistance Level of Assistance  Bathing, Feeding, Dressing Bathing Assistance: Maximum assistance Feeding assistance: Independent Dressing Assistance: Limited assistance     Functional Limitations Info             SPECIAL CARE FACTORS FREQUENCY  PT (By  licensed PT)     PT Frequency: not yet assessed              Contractures      Additional Factors Info  Code Status, Allergies, Psychotropic Code Status Info: Partial Allergies Info: Sulfa Antibiotics Psychotropic Info: Zoloft         Current Medications (09/15/2016):  This is the current hospital active medication list Current Facility-Administered Medications  Medication Dose Route Frequency Provider Last Rate Last Dose  . 0.9 %  sodium chloride infusion   Intravenous Continuous Willia Craze, NP 75 mL/hr at 09/14/16 2027    . acetaminophen (TYLENOL) tablet 650 mg  650 mg Oral Q6H PRN Willia Craze, NP       Or  . acetaminophen (TYLENOL) suppository 650 mg  650 mg Rectal Q6H PRN Willia Craze, NP      . amLODipine (NORVASC) tablet 5 mg  5 mg Oral Daily Willia Craze, NP   5 mg at 09/14/16 0831  . atorvastatin (LIPITOR) tablet 20 mg  20 mg Oral q1800 Willia Craze, NP   20 mg at 09/14/16 1737  . bisacodyl (DULCOLAX) EC tablet 5 mg  5 mg Oral Daily PRN Willia Craze, NP      . enoxaparin (LOVENOX) injection 40 mg  40 mg Subcutaneous Q24H Willia Craze, NP   40 mg at 09/14/16 1738  . HYDROcodone-acetaminophen (NORCO/VICODIN) 5-325 MG per tablet  2 tablet  2 tablet Oral TID WC PRN Orson Eva, MD      . irbesartan (AVAPRO) tablet 150 mg  150 mg Oral Daily Willia Craze, NP   150 mg at 09/14/16 0831  . levothyroxine (SYNTHROID, LEVOTHROID) tablet 25 mcg  25 mcg Oral QAC breakfast Willia Craze, NP   25 mcg at 09/14/16 0831  . lurasidone (LATUDA) tablet 60 mg  60 mg Oral Daily Willia Craze, NP   60 mg at 09/14/16 [id number]  . morphine 2 MG/ML injection 2 mg  2 mg Intravenous Q4H PRN Gardiner Barefoot, NP   2 mg at 09/15/16 0827  . nystatin (MYCOSTATIN/NYSTOP) topical powder   Topical BID Willia Craze, NP      . ondansetron [facility]) tablet 4 mg  4 mg Oral Q6H PRN Willia Craze, NP       Or  . ondansetron [facility]) injection 4 mg  4 mg Intravenous Q6H  PRN Willia Craze, NP      . polyethylene glycol (MIRALAX / GLYCOLAX) packet 17 g  17 g Oral Daily PRN Willia Craze, NP      . polyethylene glycol (MIRALAX / GLYCOLAX) packet 17 g  17 g Oral Daily Orson Eva, MD   17 g at 09/14/16 1738  . senna (SENOKOT) tablet 17.2 mg  2 tablet Oral Daily Orson Eva, MD   17.2 mg at 09/14/16 1737  . sertraline (ZOLOFT) tablet 100 mg  100 mg Oral Daily Willia Craze, NP   100 mg at 09/14/16 0831  . traZODone (DESYREL) tablet 25 mg  25 mg Oral QHS PRN Willia Craze, NP      . zolpidem (AMBIEN) tablet 10 mg  10 mg Oral QHS PRN Willia Craze, NP   10 mg at 09/14/16 2101     Discharge Medications: Please see discharge summary for a list of discharge medications.  Relevant Imaging Results:  Relevant Lab Results:   Additional Information SSN: [place] [address] [place], [place]

## 2016-09-15 NOTE — Progress Notes (Signed)
Patient's pasrr # has been submitted and is under manual review. Pasrr office is closed on the weekend. Patient cannot dc to snf without pasrr.  [name] Locus Jearld Hemp LCSWA [phone]

## 2016-09-15 NOTE — Progress Notes (Signed)
Rehab Admissions Coordinator Note:  Patient was screened by Retta Diones for appropriateness for an Inpatient Acute Rehab Consult. Noted PT recommending SNF.  Patient has [facility] and I am not sure they would authorize an inpatient rehab stay.  If a rehab consult is desired, please order consult.     Retta Diones 09/15/2016, 3:19 PM  I can be reached at [phone].

## 2016-09-15 NOTE — Discharge Summary (Addendum)
Physician Discharge Summary  [name] [id number] DOB: [birth year] DOA: 09/13/2016  PCP: [name], MD  Admit date: 09/13/2016 Discharge date: 09/15/2016   ADDENDUM--patient not discharged on this date  Admitted From: Home Disposition:  SNF  Recommendations for Outpatient Follow-up:  1. Follow up with PCP in 1-2 weeks   Discharge Condition:Stable CODE STATUS:FULL Diet recommendation: Heart Healthy  Brief/Interim Summary: 70 year old female with a history of chronic back pain who normally sees [name], hypertension, hypothyroidism, depression presented after a mechanical fall on 09/12/2016. The patient was trying to put on her shoe when she tripped over a shoe resulting in a mechanical fall. There was no prodrome symptoms or loss of consciousness. The patient had to roll the patient onto rug and subsequently dragged her to the bed. The patient was unable to bear weight or ambulate. EMS was activated. Unfortunately, the patient continued to have uncontrolled pain, and the patient was admitted for pain control and physical therapy.  Discharge Diagnoses:  Right superior ischial ramus/right inferior pubic ramus fracture -case discussed with [name], pt's PM&R physician -discussed with Ortho-- [name] management-->WBAT -pt still with uncontrolled pain requiring IV morphine -d/c with Norco 2 tabs q 6 hrs prn pain -PT eval -start cathartics  CKD stage III -Baseline creatinine of 1.0-1.3  Hypertension -Continue amlodipine and ARB with a therapeutic substitution  Depression -Continue Zoloft and Latuda  Hypothyroidism -Continue Synthroid  Hyperlipidemia -Continue atorvastatin   Discharge Instructions  Discharge Instructions    Diet - low sodium heart healthy    Complete by:  As directed    Increase activity slowly    Complete by:  As directed        Medication List    STOP taking these medications   clonazePAM  2 MG tablet Commonly known as:  KLONOPIN   rOPINIRole 1 MG tablet Commonly known as:  REQUIP     TAKE these medications   amLODipine 5 MG tablet Commonly known as:  NORVASC Take 5 mg by mouth daily.   atorvastatin 20 MG tablet Commonly known as:  LIPITOR Take 1 tablet by mouth Daily.   BENICAR 20 MG tablet Generic drug:  olmesartan Take 20 mg by mouth daily.   glucosamine-chondroitin 500-400 MG tablet Take 2 tablets by mouth daily.   HYDROcodone-acetaminophen 5-325 MG tablet Commonly known as:  NORCO/VICODIN Take 1-2 tablets by mouth every 6 (six) hours as needed for moderate pain or severe pain. What changed:  how much to take  how to take this  when to take this  reasons to take this  additional instructions   LATUDA 60 MG Tabs Generic drug:  Lurasidone HCl Take 60 mg by mouth daily.   levothyroxine 25 MCG tablet Commonly known as:  SYNTHROID, LEVOTHROID Take 25 mcg by mouth daily before breakfast.   polyethylene glycol packet Commonly known as:  MIRALAX / GLYCOLAX Take 17 g by mouth daily.   senna 8.6 MG Tabs tablet Commonly known as:  SENOKOT Take 2 tablets (17.2 mg total) by mouth daily. Start taking on:  09/16/2016   sertraline 100 MG tablet Commonly known as:  ZOLOFT Take 1 tablet by mouth Daily.   zolpidem 5 MG tablet Commonly known as:  AMBIEN Take 1 tablet (5 mg total) by mouth at bedtime as needed for sleep. What changed:  medication strength  how much to take  when to take this       Allergies  Allergen Reactions  . Sulfa Antibiotics  Consultations:  Ortho/PM&R by phone   Procedures/Studies: Dg Hip Unilat W Or Wo Pelvis 2-3 Views Right  Result Date: 09/13/2016 CLINICAL DATA:  [place] last night, now with right hip pain EXAM: DG HIP (WITH OR WITHOUT PELVIS) 2-3V RIGHT COMPARISON:  None. 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 No right hip fracture is seen. However, there are minimally  displaced fractures of the right superior ischial ramus and right inferior pubic ramus. The SI joints are corticated. The bowel gas pattern is nonspecific. IMPRESSION: 1. Acute fractures of the right superior ischial ramus and right inferior pubic ramus. 2. No hip fracture is seen. Moderate degenerative change is present in the hips, right greater than left. Electronically Signed   By: Ivar Drape M.D.   On: 09/13/2016 11:52        Discharge Exam: Vitals:   09/14/16 2149 09/15/16 0547  BP: (!) 143/76 (!) 143/75  Pulse: 96 97  Resp: 18 16  Temp: 98.1 F (36.7 C) 98.7 F (37.1 C)   Vitals:   09/14/16 0456 09/14/16 1303 09/14/16 2149 09/15/16 0547  BP: (!) 147/92 129/67 (!) 143/76 (!) 143/75  Pulse: 96 88 96 97  Resp:  16 18 16   Temp: 98.2 F (36.8 C) 98.6 F (37 C) 98.1 F (36.7 C) 98.7 F (37.1 C)  TempSrc: Oral Oral Oral Oral  SpO2:  94% 96% 94%  Weight: 88.6 kg (195 lb 5.2 oz)     Height:        General: Pt is alert, awake, not in acute distress Cardiovascular: RRR, S1/S2 +, no rubs, no gallops Respiratory: CTA bilaterally, no wheezing, no rhonchi Abdominal: Soft, NT, ND, bowel sounds + Extremities: no edema, no cyanosis   The results of significant diagnostics from this hospitalization (including imaging, microbiology, ancillary and laboratory) are listed below for reference.    Significant Diagnostic Studies: Dg Hip Unilat W Or Wo Pelvis 2-3 Views Right  Result Date: 09/13/2016 CLINICAL DATA:  [place] last night, now with right hip pain EXAM: DG HIP (WITH OR WITHOUT PELVIS) 2-3V RIGHT COMPARISON:  None. 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 No right hip fracture is seen. However, there are minimally displaced fractures of the right superior ischial ramus and right inferior pubic ramus. The SI joints are corticated. The bowel gas pattern is nonspecific. IMPRESSION: 1. Acute fractures of the right superior ischial ramus and right inferior  pubic ramus. 2. No hip fracture is seen. Moderate degenerative change is present in the hips, right greater than left. Electronically Signed   By: Ivar Drape M.D.   On: 09/13/2016 11:52     Microbiology: No results found for this or any previous visit (from the past 240 hour(s)).   Labs: Basic Metabolic Panel:  Recent Labs Lab 09/13/16 1112 09/14/16 0510 09/15/16 0536  NA 137 139 139  K 4.0 3.5 3.5  CL 105 109 107  CO2 24 23 23   GLUCOSE 108* 109* 122*  BUN 12 9 8   CREATININE 1.36* 1.19* 1.00  CALCIUM 9.0 8.6* 8.8*  MG  --   --  1.9   Liver Function Tests: No results for input(s): AST, ALT, ALKPHOS, BILITOT, PROT, ALBUMIN in the last 168 hours. No results for input(s): LIPASE, AMYLASE in the last 168 hours. No results for input(s): AMMONIA in the last 168 hours. CBC:  Recent Labs Lab 09/13/16 1112 09/14/16 0510  WBC 8.8 8.4  NEUTROABS 6.2  --  HGB 10.8* 10.0*  HCT 35.5* 32.8*  MCV 82.4 81.6  PLT 256 253   Cardiac Enzymes: No results for input(s): CKTOTAL, CKMB, CKMBINDEX, TROPONINI in the last 168 hours. BNP: Invalid input(s): POCBNP CBG: No results for input(s): GLUCAP in the last 168 hours.  Time coordinating discharge:  Greater than 30 minutes  Signed:  Murdock Jellison, DO [facility] Pager: [phone] 09/15/2016, 1:29 PM

## 2016-09-16 DIAGNOSIS — S32591G Other specified fracture of right pubis, subsequent encounter for fracture with delayed healing: Secondary | ICD-10-CM

## 2016-09-16 DIAGNOSIS — R339 Retention of urine, unspecified: Secondary | ICD-10-CM

## 2016-09-16 MED ORDER — OXYCODONE HCL 5 MG PO TABS: 5.0000 mg | ORAL_TABLET | Freq: Four times a day (QID) | ORAL | 0 refills | Status: DC | PRN

## 2016-09-16 MED ORDER — HYDROCODONE-ACETAMINOPHEN 5-325 MG PO TABS
1.0000 | ORAL_TABLET | Freq: Four times a day (QID) | ORAL | Status: DC | PRN
  Administered 2016-09-17 (×4): 2 via ORAL
  Administered 2016-09-18: 1 via ORAL
  Administered 2016-09-18: 2 via ORAL
  Filled 2016-09-16 (×7): qty 2

## 2016-09-16 NOTE — Progress Notes (Signed)
PROGRESS NOTE  [name] [id number] DOB: [birth year] DOA: 09/13/2016 PCP: [name], MD  Brief History:  70 year old female with a history of chronic back pain who normally sees [name], hypertension, hypothyroidism, depression presented after a mechanical fall on 09/12/2016. The patient was trying to put on her shoe when she tripped over a shoe resulting in a mechanical fall. There was no prodrome symptoms or loss of consciousness. The patient had to roll the patient onto rug and subsequently dragged her to the bed. The patient was unable to bear weight or ambulate. EMS was activated. Unfortunately, the patient continued to have uncontrolled pain, and the patient was admitted for pain control and physical therapy.  Assessment/Plan: Right superior ischialramus/right inferior pubic ramus fracture -case discussed with [name], pt's PM&R physician -discussed with Ortho-- [name] management-->WBAT -pt still with uncontrolled pain requiring IV morphine--monitor for somnolence -d/c with oxycodone 5 mg q 6 hrs prn pain -PT eval-->SNF -start cathartics  CKD stage III -Baseline creatinine of 1.0-1.3 -am BMP  Urinary Retention -foley was placed 9/29 -d/c foley today (10/1 for voiding trial  Hypertension -Continue amlodipine and ARB with a therapeutic substitution  Depression -Continue Zoloft and Latuda  Hypothyroidism -Continue Synthroid  Hyperlipidemia -Continue atorvastatin  Disposition Plan:   SNF 09/17/16 if stable Family Communication:   Husband updated at bedside 09/16/16  Consultants:  Ortho by phone  Code Status:  FULL DVT Prophylaxis:  [place] Lovenox   Procedures: As Listed in Progress Note Above  Antibiotics: None    Subjective: Patient continues to complain in the pelvis of significant pain with movement. At rest, pain is better controlled. She describes it as a tearing pain in her pelvis and  hips.  Patient denies fevers, chills, headache, chest pain, dyspnea, nausea, vomiting, diarrhea, abdominal pain, dysuria, hematuria, hematochezia, and melena.   Objective: Vitals:   09/15/16 2326 09/16/16 0515 09/16/16 0615 09/16/16 1343  BP: 124/61 (!) 160/87  (!) 143/70  Pulse: 87 89  87  Resp: 16 16  16   Temp: 98.2 F (36.8 C) 98.1 F (36.7 C)  98.6 F (37 C)  TempSrc: Oral Oral  Oral  SpO2: 94% 98%  93%  Weight:   87.7 kg (193 lb 4.8 oz)   Height:        Intake/Output Summary (Last 24 hours) at 09/16/16 1615 Last data filed at 09/16/16 1608  Gross per 24 hour  Intake              360 ml  Output             4020 ml  Net            -3660 ml   Weight change:  Exam:   General:  Pt is alert, follows commands appropriately, not in acute distress  HEENT: No icterus, No thrush, No neck mass, [place]/AT  Cardiovascular: RRR, S1/S2, no rubs, no gallops  Respiratory: CTA bilaterally, no wheezing, no crackles, no rhonchi  Abdomen: Soft/+BS, non tender, non distended, no guarding  Extremities: No edema, No lymphangitis, No petechiae, No rashes, no synovitis   Data Reviewed: I have personally reviewed following labs and imaging studies Basic Metabolic Panel:  Recent Labs Lab 09/13/16 1112 09/14/16 0510 09/15/16 0536  NA 137 139 139  K 4.0 3.5 3.5  CL 105 109 107  CO2 24 23 23   GLUCOSE 108* 109* 122*  BUN 12 9 8   CREATININE 1.36*  1.19* 1.00  CALCIUM 9.0 8.6* 8.8*  MG  --   --  1.9   Liver Function Tests: No results for input(s): AST, ALT, ALKPHOS, BILITOT, PROT, ALBUMIN in the last 168 hours. No results for input(s): LIPASE, AMYLASE in the last 168 hours. No results for input(s): AMMONIA in the last 168 hours. Coagulation Profile: No results for input(s): INR, PROTIME in the last 168 hours. CBC:  Recent Labs Lab 09/13/16 1112 09/14/16 0510  WBC 8.8 8.4  NEUTROABS 6.2  --   HGB 10.8* 10.0*  HCT 35.5* 32.8*  MCV 82.4 81.6  PLT 256 253   Cardiac  Enzymes: No results for input(s): CKTOTAL, CKMB, CKMBINDEX, TROPONINI in the last 168 hours. BNP: Invalid input(s): POCBNP CBG:  Recent Labs Lab 09/15/16 2121  GLUCAP 110*   HbA1C: No results for input(s): HGBA1C in the last 72 hours. Urine analysis:    Component Value Date/Time   COLORURINE YELLOW 01/08/2011 1035   APPEARANCEUR CLEAR 01/08/2011 1035   LABSPEC 1.015 01/08/2011 1035   PHURINE 5.5 01/08/2011 1035   GLUCOSEU NEGATIVE 09/30/2010 1323   HGBUR NEGATIVE 01/08/2011 [place] 01/08/2011 1035   KETONESUR NEGATIVE 01/08/2011 1035   PROTEINUR NEGATIVE 01/08/2011 1035   UROBILINOGEN 0.2 01/08/2011 1035   NITRITE NEGATIVE 01/08/2011 1035   LEUKOCYTESUR  01/08/2011 1035    NEGATIVE MICROSCOPIC NOT DONE ON URINES WITH NEGATIVE PROTEIN, BLOOD, LEUKOCYTES, NITRITE, OR GLUCOSE <1000 mg/dL.   Sepsis Labs: @LABRCNTIP (procalcitonin:4,lacticidven:4) )No results found for this or any previous visit (from the past 240 hour(s)).   Scheduled Meds: . amLODipine  5 mg Oral Daily  . atorvastatin  20 mg Oral q1800  . enoxaparin (LOVENOX) injection  40 mg Subcutaneous Q24H  . irbesartan  150 mg Oral Daily  . levothyroxine  25 mcg Oral QAC breakfast  . lurasidone  60 mg Oral Daily  . nystatin   Topical BID  . polyethylene glycol  17 g Oral Daily  . senna  2 tablet Oral Daily  . sertraline  100 mg Oral Daily   Continuous Infusions: . sodium chloride 75 mL/hr at 09/16/16 1222    Procedures/Studies: Dg Hip Unilat W Or Wo Pelvis 2-3 Views Right  Result Date: 09/13/2016 CLINICAL DATA:  [place] last night, now with right hip pain EXAM: DG HIP (WITH OR WITHOUT PELVIS) 2-3V RIGHT COMPARISON:  None. 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 No right hip fracture is seen. However, there are minimally displaced fractures of the right superior ischial ramus and right inferior pubic ramus. The SI joints are corticated. The bowel gas pattern is  nonspecific. IMPRESSION: 1. Acute fractures of the right superior ischial ramus and right inferior pubic ramus. 2. No hip fracture is seen. Moderate degenerative change is present in the hips, right greater than left. Electronically Signed   By: Ivar Drape M.D.   On: 09/13/2016 11:52    Amberlynn Tempesta, DO  [facility] Pager [phone]  If 7PM-7AM, please contact night-coverage [email or web address] Password [email or web address] 09/16/2016, 4:15 PM   LOS: 1 day

## 2016-09-16 NOTE — Discharge Summary (Addendum)
Physician Discharge Summary  [name] [id number] DOB: 1946/07/05 DOA: 09/13/2016  PCP: [name], MD  Admit date: 09/13/2016 Discharge date: 09/17/16  Addendum:  Patient not discharged on this date  Admitted From: Home Disposition:  SNF  Recommendations for Outpatient Follow-up:  1. Follow up with PCP in 1-2 weeks 2. Please obtain BMP/CBC in one week 3. Follow up Ortho (Dr. [name]) in 2 weeks    Discharge Condition: stable CODE STATUS: full Diet recommendation: Heart Healthy  Brief/Interim Summary: 70 year old female with a history of chronic back pain who normally sees [name], hypertension, hypothyroidism, depression presented after a mechanical fall on 09/12/2016. The patient was trying to put on her shoe when she tripped over a shoe resulting in a mechanical fall. There was no prodrome symptoms or loss of consciousness. The patient had to roll the patient onto rug and subsequently dragged her to the bed. The patient was unable to bear weight or ambulate. EMS was activated. Unfortunately, the patient continued to have uncontrolled pain, and the patient was admitted for pain control and physical therapy.  PT recommended SNF with which patient agreed.  Discharge Diagnoses:  Right superior ischialramus/right inferior pubic ramus fracture -case discussed with [name], pt's PM&R physician -discussed with Ortho-- [name] management-->WBAT -pt still with uncontrolled pain requiring IV morphine--monitor for somnolence -d/c with oxycodone 5 mg q 6 hrs prn pain; no hydrocodone while on oxycodone -PT eval-->SNF -continue cathartics  CKD stage III -Baseline creatinine of 1.0-1.3  Urinary Retention -foley was placed 9/29 -d/c foley today (10/1 for voiding trial)-->able to urinate spontaneously  Hypertension -Continue amlodipine and ARB with a therapeutic substitution  Depression -Continue Zoloft and  Latuda  Hypothyroidism -Continue Synthroid  Hyperlipidemia -Continue atorvastatin   Discharge Instructions  Discharge Instructions    Diet - low sodium heart healthy    Complete by:  As directed    Diet - low sodium heart healthy    Complete by:  As directed    Increase activity slowly    Complete by:  As directed    Increase activity slowly    Complete by:  As directed        Medication List    STOP taking these medications   clonazePAM 2 MG tablet Commonly known as:  KLONOPIN   HYDROcodone-acetaminophen 5-325 MG tablet Commonly known as:  NORCO/VICODIN   rOPINIRole 1 MG tablet Commonly known as:  REQUIP     TAKE these medications   amLODipine 5 MG tablet Commonly known as:  NORVASC Take 5 mg by mouth daily.   atorvastatin 20 MG tablet Commonly known as:  LIPITOR Take 1 tablet by mouth Daily.   BENICAR 20 MG tablet Generic drug:  olmesartan Take 20 mg by mouth daily.   glucosamine-chondroitin 500-400 MG tablet Take 2 tablets by mouth daily.   LATUDA 60 MG Tabs Generic drug:  Lurasidone HCl Take 60 mg by mouth daily.   levothyroxine 25 MCG tablet Commonly known as:  SYNTHROID, LEVOTHROID Take 25 mcg by mouth daily before breakfast.   oxyCODONE 5 MG immediate release tablet Commonly known as:  ROXICODONE Take 1 tablet (5 mg total) by mouth every 6 (six) hours as needed for severe pain.   polyethylene glycol packet Commonly known as:  MIRALAX / GLYCOLAX Take 17 g by mouth daily.   senna 8.6 MG Tabs tablet Commonly known as:  SENOKOT Take 2 tablets (17.2 mg total) by mouth daily.   sertraline 100 MG tablet Commonly  known as:  ZOLOFT Take 1 tablet by mouth Daily.   zolpidem 5 MG tablet Commonly known as:  AMBIEN Take 1 tablet (5 mg total) by mouth at bedtime as needed for sleep. What changed:  medication strength  how much to take  when to take this      Follow-up Information    [name],JEFFREY C, MD Follow up in 2 week(s).    Specialty:  Orthopedic Surgery Contact information: [address] [place] [address] [phone]        RAMOS,[name], MD Follow up in 1 month(s).   Specialty:  Physical Medicine and Rehabilitation Contact information: [address] [place] [address] [place] [address] [phone]          Allergies  Allergen Reactions  . Sulfa Antibiotics     Consultations:  Ortho ([name]) by phone  PM&R [name])   Procedures/Studies: Dg Hip Unilat W Or Wo Pelvis 2-3 Views Right  Result Date: 09/13/2016 CLINICAL DATA:  [place] last night, now with right hip pain EXAM: DG HIP (WITH OR WITHOUT PELVIS) 2-3V RIGHT COMPARISON:  None. 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 No right hip fracture is seen. However, there are minimally displaced fractures of the right superior ischial ramus and right inferior pubic ramus. The SI joints are corticated. The bowel gas pattern is nonspecific. IMPRESSION: 1. Acute fractures of the right superior ischial ramus and right inferior pubic ramus. 2. No hip fracture is seen. Moderate degenerative change is present in the hips, right greater than left. Electronically Signed   By: Ivar Drape M.D.   On: 09/13/2016 11:52        Discharge Exam: Vitals:   09/16/16 2132 09/17/16 0547  BP: 140/76 (!) 147/81  Pulse: 87 97  Resp: 18 18  Temp: 98 F (36.7 C) 98.6 F (37 C)   Vitals:   09/16/16 1343 09/16/16 2132 09/17/16 0500 09/17/16 0547  BP: (!) 143/70 140/76  (!) 147/81  Pulse: 87 87  97  Resp: 16 18  18   Temp: 98.6 F (37 C) 98 F (36.7 C)  98.6 F (37 C)  TempSrc: Oral Oral    SpO2: 93% 95%  96%  Weight:   88.4 kg (194 lb 14.4 oz)   Height:        General: Pt is alert, awake, not in acute distress Cardiovascular: RRR, S1/S2 +, no rubs, no gallops Respiratory: CTA bilaterally, no wheezing, no rhonchi Abdominal: Soft, NT, ND, bowel sounds + Extremities: no edema, no  cyanosis   The results of significant diagnostics from this hospitalization (including imaging, microbiology, ancillary and laboratory) are listed below for reference.    Significant Diagnostic Studies: Dg Hip Unilat W Or Wo Pelvis 2-3 Views Right  Result Date: 09/13/2016 CLINICAL DATA:  [place] last night, now with right hip pain EXAM: DG HIP (WITH OR WITHOUT PELVIS) 2-3V RIGHT COMPARISON:  None. 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 No right hip fracture is seen. However, there are minimally displaced fractures of the right superior ischial ramus and right inferior pubic ramus. The SI joints are corticated. The bowel gas pattern is nonspecific. IMPRESSION: 1. Acute fractures of the right superior ischial ramus and right inferior pubic ramus. 2. No hip fracture is seen. Moderate degenerative change is present in the hips, right greater than left. Electronically Signed   By: Ivar Drape M.D.   On: 09/13/2016 11:52     Microbiology: No  results found for this or any previous visit (from the past 240 hour(s)).   Labs: Basic Metabolic Panel:  Recent Labs Lab 09/13/16 1112 09/14/16 0510 09/15/16 0536 09/17/16 0909  NA 137 139 139 138  K 4.0 3.5 3.5 4.1  CL 105 109 107 105  CO2 24 23 23 24   GLUCOSE 108* 109* 122* 111*  BUN 12 9 8 9   CREATININE 1.36* 1.19* 1.00 0.96  CALCIUM 9.0 8.6* 8.8* 9.1  MG  --   --  1.9 1.9   Liver Function Tests: No results for input(s): AST, ALT, ALKPHOS, BILITOT, PROT, ALBUMIN in the last 168 hours. No results for input(s): LIPASE, AMYLASE in the last 168 hours. No results for input(s): AMMONIA in the last 168 hours. CBC:  Recent Labs Lab 09/13/16 1112 09/14/16 0510  WBC 8.8 8.4  NEUTROABS 6.2  --   HGB 10.8* 10.0*  HCT 35.5* 32.8*  MCV 82.4 81.6  PLT 256 253   Cardiac Enzymes: No results for input(s): CKTOTAL, CKMB, CKMBINDEX, TROPONINI in the last 168 hours. BNP: Invalid input(s): POCBNP CBG:  Recent  Labs Lab 09/15/16 2121  GLUCAP 110*    Time coordinating discharge:  Greater than 30 minutes  Signed:  Jalana Moore, DO [facility] Pager: [phone] 09/17/2016, 1:23 PM

## 2016-09-16 NOTE — Clinical Social Work Note (Signed)
Clinical Social Work Assessment  Patient Details  Name: [name] MRN: [id number] Date of Birth: 02-26-1946  Date of referral:  09/16/16               Reason for consult:  Discharge Planning                Permission sought to share information with:  Facility Sport and exercise psychologist, Tourist information centre manager, Family Supports Permission granted to share information::  Yes, Verbal Permission Granted  Name::        Agency::   (SNF)  Relationship::     Contact Information:     Housing/Transportation Living arrangements for the past 2 months:  Single Family Home Source of Information:  Patient, Medical Team Patient Interpreter Needed:  None Criminal Activity/Legal Involvement Pertinent to Current Situation/Hospitalization:  No - Comment as needed Significant Relationships:  Adult Children, Spouse Lives with:  Spouse Do you feel safe going back to the place where you live?  No Need for family participation in patient care:  Yes (Comment)  Care giving concerns: Pt expressed no concerns at this time. Pt agreeable to SNF for higher level of care.    Social Worker assessment / plan: Holiday representative met with pt and pt family and discussed CSW role with discharge planning. CSW also explained PT recommendation for SNF. Pt husband stated he wants wife to go to [facility] for SNF and that he will contact SNF to confirm bed. Pt husband also stated family wants [facility] if [facility] has no bed available for pt.   CSW will follow up with family regarding SNF bed at [facility] .   Employment status:  Retired Forensic scientist:   [employer]) PT Recommendations:  [place] / Referral to community resources:  [place]  Patient/Family's Response to care: Pt pleasant and lying in bed with family at bedside. Pt appears happy with care she is receiving at [facility].   Patient/Family's Understanding of and Emotional Response to Diagnosis, Current Treatment, and  Prognosis: Pt appears to have a good understanding of reason for admission into the hospital and with care plan.   Emotional Assessment Appearance:  Appears stated age, Well-Groomed Attitude/Demeanor/Rapport:   (Pleasant) Affect (typically observed):  Accepting, Appropriate, Pleasant Orientation:  Oriented to Self, Oriented to Place, Oriented to  Time, Oriented to Situation Alcohol / Substance use:  Not Applicable Psych involvement (Current and /or in the community):  No (Comment)  Discharge Needs  Concerns to be addressed:  Discharge Planning Concerns Readmission within the last 30 days:  No Current discharge risk:  None Barriers to Discharge:  Continued Medical Work up   Junie Spencer, LCSW 09/16/2016, 3:40 PM

## 2016-09-17 LAB — BASIC METABOLIC PANEL
Anion gap: 9 (ref 5–15)
BUN: 9 mg/dL (ref 6–20)
CHLORIDE: 105 mmol/L (ref 101–111)
CO2: 24 mmol/L (ref 22–32)
CREATININE: 0.96 mg/dL (ref 0.44–1.00)
Calcium: 9.1 mg/dL (ref 8.9–10.3)
GFR, EST NON AFRICAN AMERICAN: 59 mL/min — AB (ref >60)
Glucose, Bld: 111 mg/dL — ABNORMAL HIGH (ref 65–99)
POTASSIUM: 4.1 mmol/L (ref 3.5–5.1)
SODIUM: 138 mmol/L (ref 135–145)

## 2016-09-17 LAB — MAGNESIUM: MAGNESIUM: 1.9 mg/dL (ref 1.7–2.4)

## 2016-09-17 NOTE — Progress Notes (Signed)
Physical Therapy Treatment Patient Details Name: [name] MRN: [id number] DOB: 1946/03/05 Today's Date: 09/17/2016    History of Present Illness 70 yo female, post mechanical fall with R ischial ramus and R pubic ramus fractures, non-displaced    PT Comments    Patient moving very slowly due to pain, however able to move her RLE herself for supine to sit (and later return to supine). Still not able to ambulate as she cannot bear enough weight via her arms and RLE to allow LLE to step/advance.    Follow Up Recommendations  SNF;Supervision/Assistance - 24 hour     Equipment Recommendations  None recommended by PT    Recommendations for Other Services OT consult     Precautions / Restrictions Precautions Precautions: Fall Restrictions Weight Bearing Restrictions: No Other Position/Activity Restrictions: WBAT R LE    Mobility  Bed Mobility Overal bed mobility: Needs Assistance Bed Mobility: Sit to Supine;Sidelying to Sit   Sidelying to sit: Min guard   Sit to supine: Min guard   General bed mobility comments: Slow, cues for technique, pt nearly needing assist for RLE, however she wanted to move it herself  Transfers Overall transfer level: Needs assistance Equipment used: Rolling walker (2 wheeled) Transfers: Sit to/from [facility] Sit to Stand: Min assist;Min guard Stand pivot transfers: Min guard       General transfer comment: x3; vc for safe use of RW, difficulty bearing weight on Rt leg to allow Left foot movement/stepping; pt pivots only on Lt foot.  Ambulation/Gait             General Gait Details: unable to [facility] RLE via UEs on RW to allow stepping LLE; unable to walk   Stairs            Wheelchair Mobility    Modified Rankin (Stroke Patients Only)       Balance     Sitting balance-Leahy Scale: Fair     Standing balance support: Bilateral upper extremity supported Standing balance-Leahy Scale: Poor                       Cognition Arousal/Alertness: Awake/alert Behavior During Therapy: WFL for tasks assessed/performed Overall Cognitive Status: Within Functional Limits for tasks assessed                      Exercises General Exercises - Lower Extremity Long Arc Quad: AROM;Both;5 reps Hip Flexion/Marching: AROM;Both;Standing (with RW; 3 reps) Other Exercises Other Exercises: instructed in chair push-ups x3    General Comments        Pertinent Vitals/Pain Pain Assessment: 0-10 Pain Score: 7  Pain Location: Rt hip Pain Descriptors / Indicators: Sore;Sharp Pain Intervention(s): Premedicated before session;Limited activity within patient's tolerance;Monitored during session;Repositioned    Home Living                      Prior Function            PT Goals (current goals can now be found in the care plan section) Acute Rehab PT Goals Patient Stated Goal: Move again, go visit mother in [place] Time For Goal Achievement: 09/29/16 Progress towards PT goals: Progressing toward goals    Frequency    Min 5X/week      PT Plan Current plan remains appropriate    Co-evaluation             End of Session Equipment Utilized During Treatment: Gait  belt Activity Tolerance: Patient limited by pain Patient left: in bed;with call bell/phone within reach;with family/visitor present     Time: 1005-1034 PT Time Calculation (min) (ACUTE ONLY): 29 min  Charges:  $Therapeutic Exercise: 8-22 mins $Therapeutic Activity: 8-22 mins                    G Codes:      Illya Gienger 2016/09/27, 10:43 AM Pager [phone]

## 2016-09-17 NOTE — Progress Notes (Signed)
Pasrr system still down. Patient cannot dc to snf without pasrr #.  Percell Locus Keagan Brislin LCSWA [phone]

## 2016-09-17 NOTE — Care Management Note (Signed)
Case Management Note  Patient Details  Name: [name] MRN: [id number] Date of Birth: 12/21/1945  Subjective/Objective:            Presents s/p fall,      suffered a Right superior ischialramus/right inferior pubic ramus fracture.  Action/Plan: Plan is to d/c to SNF today.  Expected Discharge Date:     09/17/2016             Expected Discharge Plan:  [place]  In-House Referral:  Clinical Social Work  Discharge planning Services  CM Consult   Status of Service:  COMPLETED  If discussed at [employer] of Stay Meetings, dates discussed:    Additional Comments:  Sharin Mons, RN 09/17/2016, 10:11 AM

## 2016-09-18 DIAGNOSIS — S32511G Fracture of superior rim of right pubis, subsequent encounter for fracture with delayed healing: Secondary | ICD-10-CM | POA: Diagnosis not present

## 2016-09-18 DIAGNOSIS — S329XXA Fracture of unspecified parts of lumbosacral spine and pelvis, initial encounter for closed fracture: Secondary | ICD-10-CM | POA: Diagnosis not present

## 2016-09-18 DIAGNOSIS — R279 Unspecified lack of coordination: Secondary | ICD-10-CM | POA: Diagnosis not present

## 2016-09-18 DIAGNOSIS — R41841 Cognitive communication deficit: Secondary | ICD-10-CM | POA: Diagnosis not present

## 2016-09-18 DIAGNOSIS — S32591D Other specified fracture of right pubis, subsequent encounter for fracture with routine healing: Secondary | ICD-10-CM | POA: Diagnosis not present

## 2016-09-18 DIAGNOSIS — E038 Other specified hypothyroidism: Secondary | ICD-10-CM | POA: Diagnosis not present

## 2016-09-18 DIAGNOSIS — E7800 Pure hypercholesterolemia, unspecified: Secondary | ICD-10-CM | POA: Diagnosis not present

## 2016-09-18 DIAGNOSIS — R278 Other lack of coordination: Secondary | ICD-10-CM | POA: Diagnosis not present

## 2016-09-18 DIAGNOSIS — S32591G Other specified fracture of right pubis, subsequent encounter for fracture with delayed healing: Secondary | ICD-10-CM | POA: Diagnosis not present

## 2016-09-18 DIAGNOSIS — F322 Major depressive disorder, single episode, severe without psychotic features: Secondary | ICD-10-CM | POA: Diagnosis not present

## 2016-09-18 DIAGNOSIS — S32591K Other specified fracture of right pubis, subsequent encounter for fracture with nonunion: Secondary | ICD-10-CM | POA: Diagnosis not present

## 2016-09-18 DIAGNOSIS — N183 Chronic kidney disease, stage 3 (moderate): Secondary | ICD-10-CM | POA: Diagnosis not present

## 2016-09-18 DIAGNOSIS — M6281 Muscle weakness (generalized): Secondary | ICD-10-CM | POA: Diagnosis not present

## 2016-09-18 DIAGNOSIS — I10 Essential (primary) hypertension: Secondary | ICD-10-CM | POA: Diagnosis not present

## 2016-09-18 DIAGNOSIS — R296 Repeated falls: Secondary | ICD-10-CM | POA: Diagnosis not present

## 2016-09-18 DIAGNOSIS — E039 Hypothyroidism, unspecified: Secondary | ICD-10-CM | POA: Diagnosis not present

## 2016-09-18 NOTE — Consult Note (Signed)
[facility] CM Primary Care Navigator  09/18/2016  [name] 03/07/1946 [id number]  Met with patient and husband Ronalee Belts) at the bedside prior to discharge to identify possible discharge needs. Per husband, patient fell and broke her hip but no need for surgery. Patient and husband confirmed Dr. [name] from [place] as the primary care provider.    Husband shared using [facility] [employer] to obtain her medications and she manages her own medicines using "pill box" system.   Patient's husband provides transportation to doctors' appointments and he is the primary caregiver at home.   Patient will be discharging to [facility] today for short term rehabilitation as stated.   Patient and husband expressed understanding to call primary care provider's office for a post discharge follow-up appointment when she gets home. Patient letter provided as a reminder.  Both denied further needs or concerns at this point.        For additional questions please contact:  Edwena Felty A. Shone Leventhal, BSN, RN-BC [facility] PRIMARY CARE Navigator Cell: [phone]

## 2016-09-18 NOTE — Discharge Summary (Signed)
Physician Discharge Summary  [name] [id number] DOB: 1946/08/04 DOA: 09/13/2016  PCP: Cassandra Neer, MD  Admit date: 09/13/2016 Discharge date: 09/18/16  Admitted From: Home Disposition:  SNF  Recommendations for Outpatient Follow-up:  1. Follow up with PCP in 1-2 weeks 2. Please obtain BMP/CBC in one week 3. Follow up Ortho (Dr. [name]) in 2 weeks    Discharge Condition: stable CODE STATUS: full Diet recommendation: Heart Healthy  Brief/Interim Summary: 70 year old female with a history of chronic back pain who normally sees Dr. Suella Day, hypertension, hypothyroidism, depression presented after a mechanical fall on 09/12/2016. The patient was trying to put on her shoe when she tripped over a shoe resulting in a mechanical fall. There was no prodrome symptoms or loss of consciousness. The patient had to roll the patient onto rug and subsequently dragged her to the bed. The patient was unable to bear weight or ambulate. EMS was activated. Unfortunately, the patient continued to have uncontrolled pain, and the patient was admitted for pain control and physical therapy.  PT recommended SNF with which patient agreed.  Discharge Diagnoses:  Right superior ischialramus/right inferior pubic ramus fracture -case discussed with Dr. Suella Day, pt's PM&R physician -discussed with Ortho-- [name] management-->WBAT -pt still with uncontrolled pain requiring IV morphine--monitor for somnolence -d/c with oxycodone 5 mg q 6 hrs prn pain; no hydrocodone while on oxycodone -PT eval-->SNF -continue cathartics  CKD stage III -Baseline creatinine of 1.0-1.3  Urinary Retention -foley was placed 9/29 -d/c foley today (10/1 for voiding trial)-->able to urinate spontaneously  Hypertension -Continue amlodipine and ARB with a therapeutic substitution  Depression -Continue Zoloft and Latuda  Hypothyroidism -Continue  Synthroid  Hyperlipidemia -Continue atorvastatin   Discharge Instructions  Discharge Instructions    Diet - low sodium heart healthy    Complete by:  As directed    Diet - low sodium heart healthy    Complete by:  As directed    Increase activity slowly    Complete by:  As directed    Increase activity slowly    Complete by:  As directed        Medication List    STOP taking these medications   clonazePAM 2 MG tablet Commonly known as:  KLONOPIN   HYDROcodone-acetaminophen 5-325 MG tablet Commonly known as:  NORCO/VICODIN   rOPINIRole 1 MG tablet Commonly known as:  REQUIP     TAKE these medications   amLODipine 5 MG tablet Commonly known as:  NORVASC Take 5 mg by mouth daily.   atorvastatin 20 MG tablet Commonly known as:  LIPITOR Take 1 tablet by mouth Daily.   BENICAR 20 MG tablet Generic drug:  olmesartan Take 20 mg by mouth daily.   glucosamine-chondroitin 500-400 MG tablet Take 2 tablets by mouth daily.   LATUDA 60 MG Tabs Generic drug:  Lurasidone HCl Take 60 mg by mouth daily.   levothyroxine 25 MCG tablet Commonly known as:  SYNTHROID, LEVOTHROID Take 25 mcg by mouth daily before breakfast.   oxyCODONE 5 MG immediate release tablet Commonly known as:  ROXICODONE Take 1 tablet (5 mg total) by mouth every 6 (six) hours as needed for severe pain.   polyethylene glycol packet Commonly known as:  MIRALAX / GLYCOLAX Take 17 g by mouth daily.   senna 8.6 MG Tabs tablet Commonly known as:  SENOKOT Take 2 tablets (17.2 mg total) by mouth daily.   sertraline 100 MG tablet Commonly known as:  ZOLOFT Take 1 tablet by mouth  Daily.   zolpidem 5 MG tablet Commonly known as:  AMBIEN Take 1 tablet (5 mg total) by mouth at bedtime as needed for sleep. What changed:  medication strength  how much to take  when to take this       Contact information for follow-up providers    [name],JEFFREY C, MD Follow up in 2 week(s).   Specialty:   Orthopedic Surgery Contact information: [address] [place] [address] [phone]        RAMOS,[name], MD Follow up in 1 month(s).   Specialty:  Physical Medicine and Rehabilitation Contact information: [address] [place] [address] [id number]            Contact information for after-discharge care    Destination    [facility] .   Specialty:  Skilled Nursing Facility Contact information: [place] [phone]                 Allergies  Allergen Reactions  . Sulfa Antibiotics     Consultations:  Ortho ([name]) by phone  PM&R [name])   Procedures/Studies: Dg Hip Unilat W Or Wo Pelvis 2-3 Views Right  Result Date: 09/13/2016 CLINICAL DATA:  [place] last night, now with right hip pain EXAM: DG HIP (WITH OR WITHOUT PELVIS) 2-3V RIGHT COMPARISON:  None. 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 No right hip fracture is seen. However, there are minimally displaced fractures of the right superior ischial ramus and right inferior pubic ramus. The SI joints are corticated. The bowel gas pattern is nonspecific. IMPRESSION: 1. Acute fractures of the right superior ischial ramus and right inferior pubic ramus. 2. No hip fracture is seen. Moderate degenerative change is present in the hips, right greater than left. Electronically Signed   By: Ivar Drape M.D.   On: 09/13/2016 11:52        Discharge Exam: Vitals:   09/18/16 0555 09/18/16 0651  BP: (!) 174/90 132/86  Pulse: 93 86  Resp: 17   Temp: 98 F (36.7 C)    Vitals:   09/17/16 2046 09/18/16 0543 09/18/16 0555 09/18/16 0651  BP: 137/65  (!) 174/90 132/86  Pulse: 86  93 86  Resp: 18  17   Temp: 98.3 F (36.8 C)  98 F (36.7 C)   TempSrc: Oral  Oral   SpO2: 97%  97%   Weight:  87.5 kg (193 lb)    Height:        General: Pt is alert, awake, not in  acute distress Cardiovascular: RRR, S1/S2 +, no rubs, no gallops Respiratory: CTA bilaterally, no wheezing, no rhonchi Abdominal: Soft, NT, ND, bowel sounds + Extremities: no edema, no cyanosis   The results of significant diagnostics from this hospitalization (including imaging, microbiology, ancillary and laboratory) are listed below for reference.    Significant Diagnostic Studies: Dg Hip Unilat W Or Wo Pelvis 2-3 Views Right  Result Date: 09/13/2016 CLINICAL DATA:  [place] last night, now with right hip pain EXAM: DG HIP (WITH OR WITHOUT PELVIS) 2-3V RIGHT COMPARISON:  None. 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 No right hip fracture is seen. However, there are minimally displaced fractures of the right superior ischial ramus and right inferior pubic ramus. The SI joints are corticated. The bowel gas pattern is nonspecific. IMPRESSION: 1. Acute fractures of the right superior ischial ramus and right inferior pubic  ramus. 2. No hip fracture is seen. Moderate degenerative change is present in the hips, right greater than left. Electronically Signed   By: Ivar Drape M.D.   On: 09/13/2016 11:52     Microbiology: No results found for this or any previous visit (from the past 240 hour(s)).   Labs: Basic Metabolic Panel:  Recent Labs Lab 09/13/16 1112 09/14/16 0510 09/15/16 0536 09/17/16 0909  NA 137 139 139 138  K 4.0 3.5 3.5 4.1  CL 105 109 107 105  CO2 24 23 23 24   GLUCOSE 108* 109* 122* 111*  BUN 12 9 8 9   CREATININE 1.36* 1.19* 1.00 0.96  CALCIUM 9.0 8.6* 8.8* 9.1  MG  --   --  1.9 1.9   Liver Function Tests: No results for input(s): AST, ALT, ALKPHOS, BILITOT, PROT, ALBUMIN in the last 168 hours. No results for input(s): LIPASE, AMYLASE in the last 168 hours. No results for input(s): AMMONIA in the last 168 hours. CBC:  Recent Labs Lab 09/13/16 1112 09/14/16 0510  WBC 8.8 8.4  NEUTROABS 6.2  --   HGB 10.8* 10.0*  HCT 35.5* 32.8*   MCV 82.4 81.6  PLT 256 253   Cardiac Enzymes: No results for input(s): CKTOTAL, CKMB, CKMBINDEX, TROPONINI in the last 168 hours. BNP: Invalid input(s): POCBNP CBG:  Recent Labs Lab 09/15/16 2121  GLUCAP 110*    Time coordinating discharge:  Greater than 30 minutes  Signed:  Makeya Hilgert, DO [facility] Pager: [phone] 09/18/2016, 12:04 PM

## 2016-09-18 NOTE — Care Management Important Message (Signed)
Important Message  Patient Details  Name: [name] MRN: [id number] Date of Birth: 01/27/1946   Medicare Important Message Given:  Yes    Nathen May 09/18/2016, 11:32 AM

## 2016-09-18 NOTE — Clinical Social Work Placement (Signed)
   CLINICAL SOCIAL WORK PLACEMENT  NOTE  Date:  09/18/2016  Patient Details  Name: [name] MRN: [id number] Date of Birth: [birth year]  Clinical Social Work is seeking post-discharge placement for this patient at the [place] level of care (*CSW will initial, date and re-position this form in  chart as items are completed):      Patient/family provided with [place] Work Department's list of facilities offering this level of care within the geographic area requested by the patient (or if unable, by the patient's family).      Patient/family informed of their freedom to choose among providers that offer the needed level of care, that participate in Medicare, Medicaid or managed care program needed by the patient, have an available bed and are willing to accept the patient.      Patient/family informed of [facility]'s ownership interest in [facility] and [facility], as well as of the fact that they are under no obligation to receive care at these facilities.  PASRR submitted to EDS on 09/15/16     PASRR number received on 09/18/16     Existing PASRR number confirmed on       FL2 transmitted to all facilities in geographic area requested by pt/family on 09/15/16     FL2 transmitted to all facilities within larger geographic area on       Patient informed that his/her managed care company has contracts with or will negotiate with certain facilities, including the following:        Yes   Patient/family informed of bed offers received.  Patient chooses bed at [place], [place]     Physician recommends and patient chooses bed at      Patient to be transferred to [facility] on 09/18/16.  Patient to be transferred to facility by ptar     Patient family notified on 09/18/16 of transfer.  Name of family member notified:  Legrand Como     PHYSICIAN Please sign FL2     Additional Comment:     _______________________________________________ Jorge Ny, LCSW 09/18/2016, 12:20 PM

## 2016-09-18 NOTE — Progress Notes (Signed)
Patient will discharge to [facility] Anticipated discharge date: 10/3 Family notified: husband at bedside Transportation by [facility]AR- called at 12:18pm  CSW signing off.  Jorge Ny, [place] Social Worker [phone]

## 2016-10-05 DIAGNOSIS — S329XXA Fracture of unspecified parts of lumbosacral spine and pelvis, initial encounter for closed fracture: Secondary | ICD-10-CM | POA: Diagnosis not present

## 2016-10-05 DIAGNOSIS — F322 Major depressive disorder, single episode, severe without psychotic features: Secondary | ICD-10-CM | POA: Diagnosis not present

## 2016-10-05 DIAGNOSIS — N183 Chronic kidney disease, stage 3 (moderate): Secondary | ICD-10-CM | POA: Diagnosis not present

## 2016-10-05 DIAGNOSIS — I10 Essential (primary) hypertension: Secondary | ICD-10-CM | POA: Diagnosis not present

## 2016-10-06 DIAGNOSIS — Z7982 Long term (current) use of aspirin: Secondary | ICD-10-CM | POA: Diagnosis not present

## 2016-10-06 DIAGNOSIS — Z9181 History of falling: Secondary | ICD-10-CM | POA: Diagnosis not present

## 2016-10-06 DIAGNOSIS — I129 Hypertensive chronic kidney disease with stage 1 through stage 4 chronic kidney disease, or unspecified chronic kidney disease: Secondary | ICD-10-CM | POA: Diagnosis not present

## 2016-10-06 DIAGNOSIS — F329 Major depressive disorder, single episode, unspecified: Secondary | ICD-10-CM | POA: Diagnosis not present

## 2016-10-06 DIAGNOSIS — N183 Chronic kidney disease, stage 3 (moderate): Secondary | ICD-10-CM | POA: Diagnosis not present

## 2016-10-06 DIAGNOSIS — S32511D Fracture of superior rim of right pubis, subsequent encounter for fracture with routine healing: Secondary | ICD-10-CM | POA: Diagnosis not present

## 2016-10-06 DIAGNOSIS — G8929 Other chronic pain: Secondary | ICD-10-CM | POA: Diagnosis not present

## 2016-10-06 DIAGNOSIS — M6281 Muscle weakness (generalized): Secondary | ICD-10-CM | POA: Diagnosis not present

## 2016-10-08 DIAGNOSIS — G8929 Other chronic pain: Secondary | ICD-10-CM | POA: Diagnosis not present

## 2016-10-08 DIAGNOSIS — M6281 Muscle weakness (generalized): Secondary | ICD-10-CM | POA: Diagnosis not present

## 2016-10-08 DIAGNOSIS — I129 Hypertensive chronic kidney disease with stage 1 through stage 4 chronic kidney disease, or unspecified chronic kidney disease: Secondary | ICD-10-CM | POA: Diagnosis not present

## 2016-10-08 DIAGNOSIS — N183 Chronic kidney disease, stage 3 (moderate): Secondary | ICD-10-CM | POA: Diagnosis not present

## 2016-10-08 DIAGNOSIS — Z7982 Long term (current) use of aspirin: Secondary | ICD-10-CM | POA: Diagnosis not present

## 2016-10-08 DIAGNOSIS — S32511D Fracture of superior rim of right pubis, subsequent encounter for fracture with routine healing: Secondary | ICD-10-CM | POA: Diagnosis not present

## 2016-10-08 DIAGNOSIS — F329 Major depressive disorder, single episode, unspecified: Secondary | ICD-10-CM | POA: Diagnosis not present

## 2016-10-08 DIAGNOSIS — Z9181 History of falling: Secondary | ICD-10-CM | POA: Diagnosis not present

## 2016-10-09 DIAGNOSIS — S32511A Fracture of superior rim of right pubis, initial encounter for closed fracture: Secondary | ICD-10-CM | POA: Diagnosis not present

## 2016-10-09 DIAGNOSIS — S32591A Other specified fracture of right pubis, initial encounter for closed fracture: Secondary | ICD-10-CM | POA: Diagnosis not present

## 2016-10-10 DIAGNOSIS — Z9181 History of falling: Secondary | ICD-10-CM | POA: Diagnosis not present

## 2016-10-10 DIAGNOSIS — M6281 Muscle weakness (generalized): Secondary | ICD-10-CM | POA: Diagnosis not present

## 2016-10-10 DIAGNOSIS — N183 Chronic kidney disease, stage 3 (moderate): Secondary | ICD-10-CM | POA: Diagnosis not present

## 2016-10-10 DIAGNOSIS — I129 Hypertensive chronic kidney disease with stage 1 through stage 4 chronic kidney disease, or unspecified chronic kidney disease: Secondary | ICD-10-CM | POA: Diagnosis not present

## 2016-10-10 DIAGNOSIS — F329 Major depressive disorder, single episode, unspecified: Secondary | ICD-10-CM | POA: Diagnosis not present

## 2016-10-10 DIAGNOSIS — Z7982 Long term (current) use of aspirin: Secondary | ICD-10-CM | POA: Diagnosis not present

## 2016-10-10 DIAGNOSIS — G8929 Other chronic pain: Secondary | ICD-10-CM | POA: Diagnosis not present

## 2016-10-10 DIAGNOSIS — S32511D Fracture of superior rim of right pubis, subsequent encounter for fracture with routine healing: Secondary | ICD-10-CM | POA: Diagnosis not present

## 2016-10-12 DIAGNOSIS — Z7982 Long term (current) use of aspirin: Secondary | ICD-10-CM | POA: Diagnosis not present

## 2016-10-12 DIAGNOSIS — Z79891 Long term (current) use of opiate analgesic: Secondary | ICD-10-CM | POA: Diagnosis not present

## 2016-10-12 DIAGNOSIS — G8929 Other chronic pain: Secondary | ICD-10-CM | POA: Diagnosis not present

## 2016-10-12 DIAGNOSIS — S32511D Fracture of superior rim of right pubis, subsequent encounter for fracture with routine healing: Secondary | ICD-10-CM | POA: Diagnosis not present

## 2016-10-12 DIAGNOSIS — M4727 Other spondylosis with radiculopathy, lumbosacral region: Secondary | ICD-10-CM | POA: Diagnosis not present

## 2016-10-12 DIAGNOSIS — N183 Chronic kidney disease, stage 3 (moderate): Secondary | ICD-10-CM | POA: Diagnosis not present

## 2016-10-12 DIAGNOSIS — Z9181 History of falling: Secondary | ICD-10-CM | POA: Diagnosis not present

## 2016-10-12 DIAGNOSIS — M50322 Other cervical disc degeneration at C5-C6 level: Secondary | ICD-10-CM | POA: Diagnosis not present

## 2016-10-12 DIAGNOSIS — F329 Major depressive disorder, single episode, unspecified: Secondary | ICD-10-CM | POA: Diagnosis not present

## 2016-10-12 DIAGNOSIS — M542 Cervicalgia: Secondary | ICD-10-CM | POA: Diagnosis not present

## 2016-10-12 DIAGNOSIS — I129 Hypertensive chronic kidney disease with stage 1 through stage 4 chronic kidney disease, or unspecified chronic kidney disease: Secondary | ICD-10-CM | POA: Diagnosis not present

## 2016-10-12 DIAGNOSIS — M6281 Muscle weakness (generalized): Secondary | ICD-10-CM | POA: Diagnosis not present

## 2016-10-17 DIAGNOSIS — N183 Chronic kidney disease, stage 3 (moderate): Secondary | ICD-10-CM | POA: Diagnosis not present

## 2016-10-17 DIAGNOSIS — F329 Major depressive disorder, single episode, unspecified: Secondary | ICD-10-CM | POA: Diagnosis not present

## 2016-10-17 DIAGNOSIS — Z9181 History of falling: Secondary | ICD-10-CM | POA: Diagnosis not present

## 2016-10-17 DIAGNOSIS — M6281 Muscle weakness (generalized): Secondary | ICD-10-CM | POA: Diagnosis not present

## 2016-10-17 DIAGNOSIS — S32511D Fracture of superior rim of right pubis, subsequent encounter for fracture with routine healing: Secondary | ICD-10-CM | POA: Diagnosis not present

## 2016-10-17 DIAGNOSIS — I129 Hypertensive chronic kidney disease with stage 1 through stage 4 chronic kidney disease, or unspecified chronic kidney disease: Secondary | ICD-10-CM | POA: Diagnosis not present

## 2016-10-17 DIAGNOSIS — G8929 Other chronic pain: Secondary | ICD-10-CM | POA: Diagnosis not present

## 2016-10-17 DIAGNOSIS — Z7982 Long term (current) use of aspirin: Secondary | ICD-10-CM | POA: Diagnosis not present

## 2016-10-19 DIAGNOSIS — Z9181 History of falling: Secondary | ICD-10-CM | POA: Diagnosis not present

## 2016-10-19 DIAGNOSIS — I129 Hypertensive chronic kidney disease with stage 1 through stage 4 chronic kidney disease, or unspecified chronic kidney disease: Secondary | ICD-10-CM | POA: Diagnosis not present

## 2016-10-19 DIAGNOSIS — Z7982 Long term (current) use of aspirin: Secondary | ICD-10-CM | POA: Diagnosis not present

## 2016-10-19 DIAGNOSIS — M6281 Muscle weakness (generalized): Secondary | ICD-10-CM | POA: Diagnosis not present

## 2016-10-19 DIAGNOSIS — S329XXA Fracture of unspecified parts of lumbosacral spine and pelvis, initial encounter for closed fracture: Secondary | ICD-10-CM | POA: Diagnosis not present

## 2016-10-19 DIAGNOSIS — S32511D Fracture of superior rim of right pubis, subsequent encounter for fracture with routine healing: Secondary | ICD-10-CM | POA: Diagnosis not present

## 2016-10-19 DIAGNOSIS — E039 Hypothyroidism, unspecified: Secondary | ICD-10-CM | POA: Diagnosis not present

## 2016-10-19 DIAGNOSIS — G8929 Other chronic pain: Secondary | ICD-10-CM | POA: Diagnosis not present

## 2016-10-19 DIAGNOSIS — F329 Major depressive disorder, single episode, unspecified: Secondary | ICD-10-CM | POA: Diagnosis not present

## 2016-10-19 DIAGNOSIS — L0100 Impetigo, unspecified: Secondary | ICD-10-CM | POA: Diagnosis not present

## 2016-10-19 DIAGNOSIS — N183 Chronic kidney disease, stage 3 (moderate): Secondary | ICD-10-CM | POA: Diagnosis not present

## 2016-10-20 DIAGNOSIS — Z7982 Long term (current) use of aspirin: Secondary | ICD-10-CM | POA: Diagnosis not present

## 2016-10-20 DIAGNOSIS — N183 Chronic kidney disease, stage 3 (moderate): Secondary | ICD-10-CM | POA: Diagnosis not present

## 2016-10-20 DIAGNOSIS — M6281 Muscle weakness (generalized): Secondary | ICD-10-CM | POA: Diagnosis not present

## 2016-10-20 DIAGNOSIS — I129 Hypertensive chronic kidney disease with stage 1 through stage 4 chronic kidney disease, or unspecified chronic kidney disease: Secondary | ICD-10-CM | POA: Diagnosis not present

## 2016-10-20 DIAGNOSIS — F329 Major depressive disorder, single episode, unspecified: Secondary | ICD-10-CM | POA: Diagnosis not present

## 2016-10-20 DIAGNOSIS — G8929 Other chronic pain: Secondary | ICD-10-CM | POA: Diagnosis not present

## 2016-10-20 DIAGNOSIS — S32511D Fracture of superior rim of right pubis, subsequent encounter for fracture with routine healing: Secondary | ICD-10-CM | POA: Diagnosis not present

## 2016-10-20 DIAGNOSIS — Z9181 History of falling: Secondary | ICD-10-CM | POA: Diagnosis not present

## 2016-10-22 DIAGNOSIS — F329 Major depressive disorder, single episode, unspecified: Secondary | ICD-10-CM | POA: Diagnosis not present

## 2016-10-22 DIAGNOSIS — Z9181 History of falling: Secondary | ICD-10-CM | POA: Diagnosis not present

## 2016-10-22 DIAGNOSIS — M6281 Muscle weakness (generalized): Secondary | ICD-10-CM | POA: Diagnosis not present

## 2016-10-22 DIAGNOSIS — Z7982 Long term (current) use of aspirin: Secondary | ICD-10-CM | POA: Diagnosis not present

## 2016-10-22 DIAGNOSIS — I129 Hypertensive chronic kidney disease with stage 1 through stage 4 chronic kidney disease, or unspecified chronic kidney disease: Secondary | ICD-10-CM | POA: Diagnosis not present

## 2016-10-22 DIAGNOSIS — S32511D Fracture of superior rim of right pubis, subsequent encounter for fracture with routine healing: Secondary | ICD-10-CM | POA: Diagnosis not present

## 2016-10-22 DIAGNOSIS — G8929 Other chronic pain: Secondary | ICD-10-CM | POA: Diagnosis not present

## 2016-10-22 DIAGNOSIS — N183 Chronic kidney disease, stage 3 (moderate): Secondary | ICD-10-CM | POA: Diagnosis not present

## 2016-10-23 DIAGNOSIS — I129 Hypertensive chronic kidney disease with stage 1 through stage 4 chronic kidney disease, or unspecified chronic kidney disease: Secondary | ICD-10-CM | POA: Diagnosis not present

## 2016-10-23 DIAGNOSIS — Z9181 History of falling: Secondary | ICD-10-CM | POA: Diagnosis not present

## 2016-10-23 DIAGNOSIS — G8929 Other chronic pain: Secondary | ICD-10-CM | POA: Diagnosis not present

## 2016-10-23 DIAGNOSIS — Z7982 Long term (current) use of aspirin: Secondary | ICD-10-CM | POA: Diagnosis not present

## 2016-10-23 DIAGNOSIS — S32511D Fracture of superior rim of right pubis, subsequent encounter for fracture with routine healing: Secondary | ICD-10-CM | POA: Diagnosis not present

## 2016-10-23 DIAGNOSIS — F329 Major depressive disorder, single episode, unspecified: Secondary | ICD-10-CM | POA: Diagnosis not present

## 2016-10-23 DIAGNOSIS — M6281 Muscle weakness (generalized): Secondary | ICD-10-CM | POA: Diagnosis not present

## 2016-10-23 DIAGNOSIS — N183 Chronic kidney disease, stage 3 (moderate): Secondary | ICD-10-CM | POA: Diagnosis not present

## 2016-10-24 DIAGNOSIS — I129 Hypertensive chronic kidney disease with stage 1 through stage 4 chronic kidney disease, or unspecified chronic kidney disease: Secondary | ICD-10-CM | POA: Diagnosis not present

## 2016-10-24 DIAGNOSIS — N183 Chronic kidney disease, stage 3 (moderate): Secondary | ICD-10-CM | POA: Diagnosis not present

## 2016-10-24 DIAGNOSIS — S32511D Fracture of superior rim of right pubis, subsequent encounter for fracture with routine healing: Secondary | ICD-10-CM | POA: Diagnosis not present

## 2016-10-24 DIAGNOSIS — Z7982 Long term (current) use of aspirin: Secondary | ICD-10-CM | POA: Diagnosis not present

## 2016-10-24 DIAGNOSIS — Z9181 History of falling: Secondary | ICD-10-CM | POA: Diagnosis not present

## 2016-10-24 DIAGNOSIS — M6281 Muscle weakness (generalized): Secondary | ICD-10-CM | POA: Diagnosis not present

## 2016-10-24 DIAGNOSIS — G8929 Other chronic pain: Secondary | ICD-10-CM | POA: Diagnosis not present

## 2016-10-24 DIAGNOSIS — F329 Major depressive disorder, single episode, unspecified: Secondary | ICD-10-CM | POA: Diagnosis not present

## 2016-10-29 DIAGNOSIS — F329 Major depressive disorder, single episode, unspecified: Secondary | ICD-10-CM | POA: Diagnosis not present

## 2016-10-29 DIAGNOSIS — Z9181 History of falling: Secondary | ICD-10-CM | POA: Diagnosis not present

## 2016-10-29 DIAGNOSIS — Z7982 Long term (current) use of aspirin: Secondary | ICD-10-CM | POA: Diagnosis not present

## 2016-10-29 DIAGNOSIS — S32511D Fracture of superior rim of right pubis, subsequent encounter for fracture with routine healing: Secondary | ICD-10-CM | POA: Diagnosis not present

## 2016-10-29 DIAGNOSIS — G8929 Other chronic pain: Secondary | ICD-10-CM | POA: Diagnosis not present

## 2016-10-29 DIAGNOSIS — N183 Chronic kidney disease, stage 3 (moderate): Secondary | ICD-10-CM | POA: Diagnosis not present

## 2016-10-29 DIAGNOSIS — I129 Hypertensive chronic kidney disease with stage 1 through stage 4 chronic kidney disease, or unspecified chronic kidney disease: Secondary | ICD-10-CM | POA: Diagnosis not present

## 2016-10-29 DIAGNOSIS — M6281 Muscle weakness (generalized): Secondary | ICD-10-CM | POA: Diagnosis not present

## 2016-10-31 DIAGNOSIS — S32511D Fracture of superior rim of right pubis, subsequent encounter for fracture with routine healing: Secondary | ICD-10-CM | POA: Diagnosis not present

## 2016-10-31 DIAGNOSIS — F329 Major depressive disorder, single episode, unspecified: Secondary | ICD-10-CM | POA: Diagnosis not present

## 2016-10-31 DIAGNOSIS — G8929 Other chronic pain: Secondary | ICD-10-CM | POA: Diagnosis not present

## 2016-10-31 DIAGNOSIS — M6281 Muscle weakness (generalized): Secondary | ICD-10-CM | POA: Diagnosis not present

## 2016-10-31 DIAGNOSIS — N183 Chronic kidney disease, stage 3 (moderate): Secondary | ICD-10-CM | POA: Diagnosis not present

## 2016-10-31 DIAGNOSIS — Z9181 History of falling: Secondary | ICD-10-CM | POA: Diagnosis not present

## 2016-10-31 DIAGNOSIS — Z7982 Long term (current) use of aspirin: Secondary | ICD-10-CM | POA: Diagnosis not present

## 2016-10-31 DIAGNOSIS — I129 Hypertensive chronic kidney disease with stage 1 through stage 4 chronic kidney disease, or unspecified chronic kidney disease: Secondary | ICD-10-CM | POA: Diagnosis not present

## 2016-11-01 DIAGNOSIS — G8929 Other chronic pain: Secondary | ICD-10-CM | POA: Diagnosis not present

## 2016-11-01 DIAGNOSIS — Z7982 Long term (current) use of aspirin: Secondary | ICD-10-CM | POA: Diagnosis not present

## 2016-11-01 DIAGNOSIS — I129 Hypertensive chronic kidney disease with stage 1 through stage 4 chronic kidney disease, or unspecified chronic kidney disease: Secondary | ICD-10-CM | POA: Diagnosis not present

## 2016-11-01 DIAGNOSIS — F329 Major depressive disorder, single episode, unspecified: Secondary | ICD-10-CM | POA: Diagnosis not present

## 2016-11-01 DIAGNOSIS — M6281 Muscle weakness (generalized): Secondary | ICD-10-CM | POA: Diagnosis not present

## 2016-11-01 DIAGNOSIS — Z9181 History of falling: Secondary | ICD-10-CM | POA: Diagnosis not present

## 2016-11-01 DIAGNOSIS — N183 Chronic kidney disease, stage 3 (moderate): Secondary | ICD-10-CM | POA: Diagnosis not present

## 2016-11-01 DIAGNOSIS — S32511D Fracture of superior rim of right pubis, subsequent encounter for fracture with routine healing: Secondary | ICD-10-CM | POA: Diagnosis not present

## 2016-11-13 DIAGNOSIS — S32591D Other specified fracture of right pubis, subsequent encounter for fracture with routine healing: Secondary | ICD-10-CM | POA: Diagnosis not present

## 2016-11-13 DIAGNOSIS — S32511D Fracture of superior rim of right pubis, subsequent encounter for fracture with routine healing: Secondary | ICD-10-CM | POA: Diagnosis not present

## 2016-11-20 DIAGNOSIS — M47816 Spondylosis without myelopathy or radiculopathy, lumbar region: Secondary | ICD-10-CM | POA: Diagnosis not present

## 2016-11-20 DIAGNOSIS — M50322 Other cervical disc degeneration at C5-C6 level: Secondary | ICD-10-CM | POA: Diagnosis not present

## 2016-12-19 DIAGNOSIS — E039 Hypothyroidism, unspecified: Secondary | ICD-10-CM | POA: Diagnosis not present

## 2017-01-23 DIAGNOSIS — E669 Obesity, unspecified: Secondary | ICD-10-CM | POA: Diagnosis not present

## 2017-01-23 DIAGNOSIS — M549 Dorsalgia, unspecified: Secondary | ICD-10-CM | POA: Diagnosis not present

## 2017-02-05 DIAGNOSIS — E039 Hypothyroidism, unspecified: Secondary | ICD-10-CM | POA: Diagnosis not present

## 2017-02-06 DIAGNOSIS — M549 Dorsalgia, unspecified: Secondary | ICD-10-CM | POA: Diagnosis not present

## 2017-02-11 DIAGNOSIS — M549 Dorsalgia, unspecified: Secondary | ICD-10-CM | POA: Diagnosis not present

## 2017-02-15 DIAGNOSIS — M549 Dorsalgia, unspecified: Secondary | ICD-10-CM | POA: Diagnosis not present

## 2017-02-18 DIAGNOSIS — M549 Dorsalgia, unspecified: Secondary | ICD-10-CM | POA: Diagnosis not present

## 2017-03-27 DIAGNOSIS — N183 Chronic kidney disease, stage 3 (moderate): Secondary | ICD-10-CM | POA: Diagnosis not present

## 2017-03-27 DIAGNOSIS — Z1159 Encounter for screening for other viral diseases: Secondary | ICD-10-CM | POA: Diagnosis not present

## 2017-03-27 DIAGNOSIS — M797 Fibromyalgia: Secondary | ICD-10-CM | POA: Diagnosis not present

## 2017-03-27 DIAGNOSIS — R7301 Impaired fasting glucose: Secondary | ICD-10-CM | POA: Diagnosis not present

## 2017-03-27 DIAGNOSIS — G4733 Obstructive sleep apnea (adult) (pediatric): Secondary | ICD-10-CM | POA: Diagnosis not present

## 2017-03-27 DIAGNOSIS — E782 Mixed hyperlipidemia: Secondary | ICD-10-CM | POA: Diagnosis not present

## 2017-03-27 DIAGNOSIS — E039 Hypothyroidism, unspecified: Secondary | ICD-10-CM | POA: Diagnosis not present

## 2017-03-27 DIAGNOSIS — Z0000 Encounter for general adult medical examination without abnormal findings: Secondary | ICD-10-CM | POA: Diagnosis not present

## 2017-03-27 DIAGNOSIS — I129 Hypertensive chronic kidney disease with stage 1 through stage 4 chronic kidney disease, or unspecified chronic kidney disease: Secondary | ICD-10-CM | POA: Diagnosis not present

## 2017-03-27 DIAGNOSIS — E669 Obesity, unspecified: Secondary | ICD-10-CM | POA: Diagnosis not present

## 2017-04-10 DIAGNOSIS — M47816 Spondylosis without myelopathy or radiculopathy, lumbar region: Secondary | ICD-10-CM | POA: Diagnosis not present

## 2017-04-10 DIAGNOSIS — M50322 Other cervical disc degeneration at C5-C6 level: Secondary | ICD-10-CM | POA: Diagnosis not present

## 2017-04-26 DIAGNOSIS — N183 Chronic kidney disease, stage 3 (moderate): Secondary | ICD-10-CM | POA: Diagnosis not present

## 2017-04-26 DIAGNOSIS — N179 Acute kidney failure, unspecified: Secondary | ICD-10-CM | POA: Diagnosis not present

## 2017-04-26 DIAGNOSIS — A4189 Other specified sepsis: Secondary | ICD-10-CM | POA: Diagnosis not present

## 2017-04-26 DIAGNOSIS — E039 Hypothyroidism, unspecified: Secondary | ICD-10-CM | POA: Diagnosis not present

## 2017-04-26 DIAGNOSIS — G4733 Obstructive sleep apnea (adult) (pediatric): Secondary | ICD-10-CM | POA: Diagnosis not present

## 2017-04-26 DIAGNOSIS — M549 Dorsalgia, unspecified: Secondary | ICD-10-CM | POA: Diagnosis not present

## 2017-04-26 DIAGNOSIS — I129 Hypertensive chronic kidney disease with stage 1 through stage 4 chronic kidney disease, or unspecified chronic kidney disease: Secondary | ICD-10-CM | POA: Diagnosis not present

## 2017-05-01 DIAGNOSIS — Z780 Asymptomatic menopausal state: Secondary | ICD-10-CM | POA: Diagnosis not present

## 2017-05-01 DIAGNOSIS — Z13820 Encounter for screening for osteoporosis: Secondary | ICD-10-CM | POA: Diagnosis not present

## 2017-05-09 DIAGNOSIS — M549 Dorsalgia, unspecified: Secondary | ICD-10-CM | POA: Diagnosis not present

## 2017-05-23 DIAGNOSIS — M47816 Spondylosis without myelopathy or radiculopathy, lumbar region: Secondary | ICD-10-CM | POA: Diagnosis not present

## 2017-05-23 DIAGNOSIS — M50322 Other cervical disc degeneration at C5-C6 level: Secondary | ICD-10-CM | POA: Diagnosis not present

## 2017-06-14 DIAGNOSIS — M542 Cervicalgia: Secondary | ICD-10-CM | POA: Diagnosis not present

## 2017-06-14 DIAGNOSIS — M47812 Spondylosis without myelopathy or radiculopathy, cervical region: Secondary | ICD-10-CM | POA: Diagnosis not present

## 2017-06-22 DIAGNOSIS — M542 Cervicalgia: Secondary | ICD-10-CM | POA: Diagnosis not present

## 2017-06-27 DIAGNOSIS — E782 Mixed hyperlipidemia: Secondary | ICD-10-CM | POA: Diagnosis not present

## 2017-07-02 DIAGNOSIS — M47812 Spondylosis without myelopathy or radiculopathy, cervical region: Secondary | ICD-10-CM | POA: Diagnosis not present

## 2017-07-02 DIAGNOSIS — M48062 Spinal stenosis, lumbar region with neurogenic claudication: Secondary | ICD-10-CM | POA: Diagnosis not present

## 2017-07-02 DIAGNOSIS — M542 Cervicalgia: Secondary | ICD-10-CM | POA: Diagnosis not present

## 2017-07-06 DIAGNOSIS — M48062 Spinal stenosis, lumbar region with neurogenic claudication: Secondary | ICD-10-CM | POA: Diagnosis not present

## 2017-07-16 DIAGNOSIS — M48062 Spinal stenosis, lumbar region with neurogenic claudication: Secondary | ICD-10-CM | POA: Diagnosis not present

## 2017-07-18 DIAGNOSIS — I129 Hypertensive chronic kidney disease with stage 1 through stage 4 chronic kidney disease, or unspecified chronic kidney disease: Secondary | ICD-10-CM | POA: Diagnosis not present

## 2017-07-18 DIAGNOSIS — N1330 Unspecified hydronephrosis: Secondary | ICD-10-CM | POA: Diagnosis not present

## 2017-07-23 DIAGNOSIS — G894 Chronic pain syndrome: Secondary | ICD-10-CM | POA: Diagnosis not present

## 2017-07-23 DIAGNOSIS — M47816 Spondylosis without myelopathy or radiculopathy, lumbar region: Secondary | ICD-10-CM | POA: Diagnosis not present

## 2017-07-23 DIAGNOSIS — M4727 Other spondylosis with radiculopathy, lumbosacral region: Secondary | ICD-10-CM | POA: Diagnosis not present

## 2017-07-24 DIAGNOSIS — R8271 Bacteriuria: Secondary | ICD-10-CM | POA: Diagnosis not present

## 2017-07-24 DIAGNOSIS — N1339 Other hydronephrosis: Secondary | ICD-10-CM | POA: Diagnosis not present

## 2017-07-30 ENCOUNTER — Other Ambulatory Visit (HOSPITAL_COMMUNITY): Payer: Self-pay | Admitting: Urology

## 2017-07-30 DIAGNOSIS — N1330 Unspecified hydronephrosis: Secondary | ICD-10-CM | POA: Diagnosis not present

## 2017-07-30 DIAGNOSIS — N1339 Other hydronephrosis: Secondary | ICD-10-CM | POA: Diagnosis not present

## 2017-08-06 ENCOUNTER — Ambulatory Visit (HOSPITAL_COMMUNITY)
Admission: RE | Admit: 2017-08-06 | Discharge: 2017-08-06 | Disposition: A | Discharge status: Home / Self Care | Payer: Medicare HMO | Source: Ambulatory Visit | Attending: Urology | Admitting: Urology

## 2017-08-06 DIAGNOSIS — N1339 Other hydronephrosis: Secondary | ICD-10-CM | POA: Diagnosis not present

## 2017-08-06 DIAGNOSIS — M549 Dorsalgia, unspecified: Secondary | ICD-10-CM | POA: Diagnosis not present

## 2017-08-06 MED ORDER — FUROSEMIDE 10 MG/ML IJ SOLN
44.0000 mg | Freq: Once | INTRAMUSCULAR | Status: AC
  Administered 2017-08-06: 44 mg via INTRAVENOUS

## 2017-08-06 MED ORDER — FUROSEMIDE 10 MG/ML IJ SOLN
INTRAMUSCULAR | Status: AC
  Filled 2017-08-06: qty 8

## 2017-08-06 MED ORDER — TECHNETIUM TC 99M MERTIATIDE
5.2000 | Freq: Once | INTRAVENOUS | Status: AC | PRN
  Administered 2017-08-06: 5.2 via INTRAVENOUS

## 2017-08-09 DIAGNOSIS — N3000 Acute cystitis without hematuria: Secondary | ICD-10-CM | POA: Diagnosis not present

## 2017-08-09 DIAGNOSIS — N1339 Other hydronephrosis: Secondary | ICD-10-CM | POA: Diagnosis not present

## 2017-08-09 DIAGNOSIS — N130 Hydronephrosis with ureteropelvic junction obstruction: Secondary | ICD-10-CM | POA: Diagnosis not present

## 2017-08-26 DIAGNOSIS — F413 Other mixed anxiety disorders: Secondary | ICD-10-CM | POA: Diagnosis not present

## 2017-08-26 DIAGNOSIS — F4542 Pain disorder with related psychological factors: Secondary | ICD-10-CM | POA: Diagnosis not present

## 2017-08-26 DIAGNOSIS — F341 Dysthymic disorder: Secondary | ICD-10-CM | POA: Diagnosis not present

## 2017-08-26 DIAGNOSIS — M48062 Spinal stenosis, lumbar region with neurogenic claudication: Secondary | ICD-10-CM | POA: Diagnosis not present

## 2017-08-26 DIAGNOSIS — G894 Chronic pain syndrome: Secondary | ICD-10-CM | POA: Diagnosis not present

## 2017-09-17 DIAGNOSIS — Z23 Encounter for immunization: Secondary | ICD-10-CM | POA: Diagnosis not present

## 2017-09-17 DIAGNOSIS — N130 Hydronephrosis with ureteropelvic junction obstruction: Secondary | ICD-10-CM | POA: Diagnosis not present

## 2017-09-27 DIAGNOSIS — E782 Mixed hyperlipidemia: Secondary | ICD-10-CM | POA: Diagnosis not present

## 2017-09-27 DIAGNOSIS — M797 Fibromyalgia: Secondary | ICD-10-CM | POA: Diagnosis not present

## 2017-09-27 DIAGNOSIS — N183 Chronic kidney disease, stage 3 (moderate): Secondary | ICD-10-CM | POA: Diagnosis not present

## 2017-09-27 DIAGNOSIS — R232 Flushing: Secondary | ICD-10-CM | POA: Diagnosis not present

## 2017-09-27 DIAGNOSIS — E039 Hypothyroidism, unspecified: Secondary | ICD-10-CM | POA: Diagnosis not present

## 2017-09-27 DIAGNOSIS — I129 Hypertensive chronic kidney disease with stage 1 through stage 4 chronic kidney disease, or unspecified chronic kidney disease: Secondary | ICD-10-CM | POA: Diagnosis not present

## 2017-09-27 DIAGNOSIS — R7301 Impaired fasting glucose: Secondary | ICD-10-CM | POA: Diagnosis not present

## 2017-09-27 DIAGNOSIS — E669 Obesity, unspecified: Secondary | ICD-10-CM | POA: Diagnosis not present

## 2017-09-27 DIAGNOSIS — F322 Major depressive disorder, single episode, severe without psychotic features: Secondary | ICD-10-CM | POA: Diagnosis not present

## 2017-10-08 ENCOUNTER — Other Ambulatory Visit: Payer: Self-pay | Admitting: Family Medicine

## 2017-10-08 ENCOUNTER — Ambulatory Visit
Admission: RE | Admit: 2017-10-08 | Discharge: 2017-10-08 | Disposition: A | Discharge status: Home / Self Care | Payer: Medicare HMO | Source: Ambulatory Visit | Attending: Family Medicine | Admitting: Family Medicine

## 2017-10-08 DIAGNOSIS — M79622 Pain in left upper arm: Secondary | ICD-10-CM | POA: Diagnosis not present

## 2017-10-08 DIAGNOSIS — R52 Pain, unspecified: Secondary | ICD-10-CM

## 2017-10-08 DIAGNOSIS — M79602 Pain in left arm: Secondary | ICD-10-CM | POA: Diagnosis not present

## 2017-10-21 DIAGNOSIS — M7542 Impingement syndrome of left shoulder: Secondary | ICD-10-CM | POA: Diagnosis not present

## 2017-10-29 DIAGNOSIS — G8929 Other chronic pain: Secondary | ICD-10-CM | POA: Diagnosis not present

## 2017-10-29 DIAGNOSIS — M25512 Pain in left shoulder: Secondary | ICD-10-CM | POA: Diagnosis not present

## 2017-11-15 ENCOUNTER — Telehealth: Payer: Self-pay | Admitting: *Deleted

## 2017-11-15 NOTE — Telephone Encounter (Signed)
I placed a call to [name] to see if she was still doing well or would she be needing myobloc appointment.  She is still doing well, so she will not be needing the myobloc we have on hand.

## 2017-11-18 DIAGNOSIS — G8929 Other chronic pain: Secondary | ICD-10-CM | POA: Diagnosis not present

## 2017-11-18 DIAGNOSIS — M25512 Pain in left shoulder: Secondary | ICD-10-CM | POA: Diagnosis not present

## 2017-11-18 DIAGNOSIS — M19012 Primary osteoarthritis, left shoulder: Secondary | ICD-10-CM | POA: Diagnosis not present

## 2017-11-21 DIAGNOSIS — M542 Cervicalgia: Secondary | ICD-10-CM | POA: Diagnosis not present

## 2017-11-21 DIAGNOSIS — M50322 Other cervical disc degeneration at C5-C6 level: Secondary | ICD-10-CM | POA: Diagnosis not present

## 2017-11-28 ENCOUNTER — Other Ambulatory Visit: Payer: Self-pay | Admitting: Gastroenterology

## 2017-11-28 DIAGNOSIS — M549 Dorsalgia, unspecified: Secondary | ICD-10-CM | POA: Diagnosis not present

## 2017-11-28 DIAGNOSIS — R300 Dysuria: Secondary | ICD-10-CM | POA: Diagnosis not present

## 2017-11-28 DIAGNOSIS — D631 Anemia in chronic kidney disease: Secondary | ICD-10-CM | POA: Diagnosis not present

## 2017-11-28 DIAGNOSIS — N183 Chronic kidney disease, stage 3 (moderate): Secondary | ICD-10-CM | POA: Diagnosis not present

## 2017-11-28 DIAGNOSIS — R000 Tachycardia, unspecified: Secondary | ICD-10-CM | POA: Diagnosis not present

## 2017-11-28 DIAGNOSIS — R1030 Lower abdominal pain, unspecified: Secondary | ICD-10-CM

## 2017-11-28 DIAGNOSIS — R1010 Upper abdominal pain, unspecified: Secondary | ICD-10-CM

## 2017-11-28 DIAGNOSIS — N1330 Unspecified hydronephrosis: Secondary | ICD-10-CM | POA: Diagnosis not present

## 2017-11-28 DIAGNOSIS — I129 Hypertensive chronic kidney disease with stage 1 through stage 4 chronic kidney disease, or unspecified chronic kidney disease: Secondary | ICD-10-CM | POA: Diagnosis not present

## 2017-11-28 DIAGNOSIS — G8929 Other chronic pain: Secondary | ICD-10-CM | POA: Diagnosis not present

## 2017-12-03 ENCOUNTER — Ambulatory Visit
Admission: RE | Admit: 2017-12-03 | Discharge: 2017-12-03 | Disposition: A | Discharge status: Home / Self Care | Payer: Medicare HMO | Source: Ambulatory Visit | Attending: Gastroenterology | Admitting: Gastroenterology

## 2017-12-03 DIAGNOSIS — N1330 Unspecified hydronephrosis: Secondary | ICD-10-CM | POA: Diagnosis not present

## 2017-12-03 DIAGNOSIS — R1030 Lower abdominal pain, unspecified: Secondary | ICD-10-CM

## 2017-12-03 DIAGNOSIS — R1010 Upper abdominal pain, unspecified: Secondary | ICD-10-CM

## 2017-12-05 DIAGNOSIS — R000 Tachycardia, unspecified: Secondary | ICD-10-CM | POA: Diagnosis not present

## 2017-12-06 DIAGNOSIS — M792 Neuralgia and neuritis, unspecified: Secondary | ICD-10-CM | POA: Diagnosis not present

## 2017-12-06 DIAGNOSIS — G894 Chronic pain syndrome: Secondary | ICD-10-CM | POA: Diagnosis not present

## 2017-12-06 DIAGNOSIS — M79606 Pain in leg, unspecified: Secondary | ICD-10-CM | POA: Diagnosis not present

## 2017-12-06 DIAGNOSIS — M5416 Radiculopathy, lumbar region: Secondary | ICD-10-CM | POA: Diagnosis not present

## 2017-12-13 DIAGNOSIS — G894 Chronic pain syndrome: Secondary | ICD-10-CM | POA: Diagnosis not present

## 2017-12-13 DIAGNOSIS — M50322 Other cervical disc degeneration at C5-C6 level: Secondary | ICD-10-CM | POA: Diagnosis not present

## 2017-12-13 DIAGNOSIS — M48062 Spinal stenosis, lumbar region with neurogenic claudication: Secondary | ICD-10-CM | POA: Diagnosis not present

## 2017-12-18 DIAGNOSIS — D126 Benign neoplasm of colon, unspecified: Secondary | ICD-10-CM | POA: Diagnosis not present

## 2017-12-18 DIAGNOSIS — R1010 Upper abdominal pain, unspecified: Secondary | ICD-10-CM | POA: Diagnosis not present

## 2017-12-18 DIAGNOSIS — R1030 Lower abdominal pain, unspecified: Secondary | ICD-10-CM | POA: Diagnosis not present

## 2017-12-19 DIAGNOSIS — R000 Tachycardia, unspecified: Secondary | ICD-10-CM | POA: Diagnosis not present

## 2017-12-20 DIAGNOSIS — E668 Other obesity: Secondary | ICD-10-CM | POA: Diagnosis not present

## 2017-12-20 DIAGNOSIS — R000 Tachycardia, unspecified: Secondary | ICD-10-CM | POA: Diagnosis not present

## 2017-12-20 DIAGNOSIS — I119 Hypertensive heart disease without heart failure: Secondary | ICD-10-CM | POA: Diagnosis not present

## 2017-12-20 DIAGNOSIS — E7849 Other hyperlipidemia: Secondary | ICD-10-CM | POA: Diagnosis not present

## 2017-12-20 DIAGNOSIS — R0789 Other chest pain: Secondary | ICD-10-CM | POA: Diagnosis not present

## 2017-12-20 DIAGNOSIS — E669 Obesity, unspecified: Secondary | ICD-10-CM | POA: Diagnosis not present

## 2017-12-20 DIAGNOSIS — N183 Chronic kidney disease, stage 3 (moderate): Secondary | ICD-10-CM | POA: Diagnosis not present

## 2017-12-20 DIAGNOSIS — I10 Essential (primary) hypertension: Secondary | ICD-10-CM | POA: Diagnosis not present

## 2017-12-21 DIAGNOSIS — M545 Low back pain: Secondary | ICD-10-CM | POA: Diagnosis not present

## 2017-12-25 DIAGNOSIS — R1030 Lower abdominal pain, unspecified: Secondary | ICD-10-CM | POA: Diagnosis not present

## 2017-12-25 DIAGNOSIS — D126 Benign neoplasm of colon, unspecified: Secondary | ICD-10-CM | POA: Diagnosis not present

## 2017-12-25 DIAGNOSIS — R1010 Upper abdominal pain, unspecified: Secondary | ICD-10-CM | POA: Diagnosis not present

## 2017-12-25 DIAGNOSIS — K2930 Chronic superficial gastritis without bleeding: Secondary | ICD-10-CM | POA: Diagnosis not present

## 2017-12-25 DIAGNOSIS — K2980 Duodenitis without bleeding: Secondary | ICD-10-CM | POA: Diagnosis not present

## 2017-12-25 DIAGNOSIS — K6289 Other specified diseases of anus and rectum: Secondary | ICD-10-CM | POA: Diagnosis not present

## 2017-12-27 DIAGNOSIS — R079 Chest pain, unspecified: Secondary | ICD-10-CM | POA: Diagnosis not present

## 2017-12-30 DIAGNOSIS — G894 Chronic pain syndrome: Secondary | ICD-10-CM | POA: Diagnosis not present

## 2017-12-30 DIAGNOSIS — M545 Low back pain: Secondary | ICD-10-CM | POA: Diagnosis not present

## 2017-12-30 DIAGNOSIS — N3010 Interstitial cystitis (chronic) without hematuria: Secondary | ICD-10-CM | POA: Diagnosis not present

## 2017-12-30 DIAGNOSIS — Z79899 Other long term (current) drug therapy: Secondary | ICD-10-CM | POA: Diagnosis not present

## 2018-01-01 ENCOUNTER — Other Ambulatory Visit (HOSPITAL_COMMUNITY): Payer: Self-pay | Admitting: Urology

## 2018-01-01 DIAGNOSIS — N130 Hydronephrosis with ureteropelvic junction obstruction: Secondary | ICD-10-CM

## 2018-01-01 DIAGNOSIS — K2980 Duodenitis without bleeding: Secondary | ICD-10-CM | POA: Diagnosis not present

## 2018-01-01 DIAGNOSIS — D126 Benign neoplasm of colon, unspecified: Secondary | ICD-10-CM | POA: Diagnosis not present

## 2018-01-01 DIAGNOSIS — K2930 Chronic superficial gastritis without bleeding: Secondary | ICD-10-CM | POA: Diagnosis not present

## 2018-01-10 DIAGNOSIS — E669 Obesity, unspecified: Secondary | ICD-10-CM | POA: Diagnosis not present

## 2018-01-10 DIAGNOSIS — I119 Hypertensive heart disease without heart failure: Secondary | ICD-10-CM | POA: Diagnosis not present

## 2018-01-10 DIAGNOSIS — R0789 Other chest pain: Secondary | ICD-10-CM | POA: Diagnosis not present

## 2018-01-10 DIAGNOSIS — E7849 Other hyperlipidemia: Secondary | ICD-10-CM | POA: Diagnosis not present

## 2018-01-10 DIAGNOSIS — R079 Chest pain, unspecified: Secondary | ICD-10-CM | POA: Diagnosis not present

## 2018-01-10 DIAGNOSIS — N183 Chronic kidney disease, stage 3 (moderate): Secondary | ICD-10-CM | POA: Diagnosis not present

## 2018-01-10 DIAGNOSIS — R000 Tachycardia, unspecified: Secondary | ICD-10-CM | POA: Diagnosis not present

## 2018-01-13 DIAGNOSIS — M13812 Other specified arthritis, left shoulder: Secondary | ICD-10-CM | POA: Diagnosis not present

## 2018-01-20 DIAGNOSIS — I119 Hypertensive heart disease without heart failure: Secondary | ICD-10-CM | POA: Diagnosis not present

## 2018-01-20 DIAGNOSIS — R000 Tachycardia, unspecified: Secondary | ICD-10-CM | POA: Diagnosis not present

## 2018-01-20 DIAGNOSIS — E669 Obesity, unspecified: Secondary | ICD-10-CM | POA: Diagnosis not present

## 2018-01-20 DIAGNOSIS — E7849 Other hyperlipidemia: Secondary | ICD-10-CM | POA: Diagnosis not present

## 2018-01-20 DIAGNOSIS — R0789 Other chest pain: Secondary | ICD-10-CM | POA: Diagnosis not present

## 2018-01-20 DIAGNOSIS — R079 Chest pain, unspecified: Secondary | ICD-10-CM | POA: Diagnosis not present

## 2018-01-20 DIAGNOSIS — N183 Chronic kidney disease, stage 3 (moderate): Secondary | ICD-10-CM | POA: Diagnosis not present

## 2018-01-22 DIAGNOSIS — M13812 Other specified arthritis, left shoulder: Secondary | ICD-10-CM | POA: Diagnosis not present

## 2018-01-24 DIAGNOSIS — M549 Dorsalgia, unspecified: Secondary | ICD-10-CM | POA: Diagnosis not present

## 2018-01-24 DIAGNOSIS — R000 Tachycardia, unspecified: Secondary | ICD-10-CM | POA: Diagnosis not present

## 2018-01-24 DIAGNOSIS — F322 Major depressive disorder, single episode, severe without psychotic features: Secondary | ICD-10-CM | POA: Diagnosis not present

## 2018-01-27 ENCOUNTER — Encounter (HOSPITAL_COMMUNITY)
Admission: RE | Admit: 2018-01-27 | Discharge: 2018-01-27 | Disposition: A | Discharge status: Home / Self Care | Payer: Medicare HMO | Source: Ambulatory Visit | Attending: Urology | Admitting: Urology

## 2018-01-27 DIAGNOSIS — N130 Hydronephrosis with ureteropelvic junction obstruction: Secondary | ICD-10-CM | POA: Diagnosis not present

## 2018-01-27 DIAGNOSIS — N1330 Unspecified hydronephrosis: Secondary | ICD-10-CM | POA: Diagnosis not present

## 2018-01-27 MED ORDER — TECHNETIUM TC 99M MERTIATIDE
4.9000 | Freq: Once | INTRAVENOUS | Status: AC | PRN
  Administered 2018-01-27: 4.9 via INTRAVENOUS

## 2018-01-27 MED ORDER — FUROSEMIDE 10 MG/ML IJ SOLN
44.0000 mg | Freq: Once | INTRAMUSCULAR | Status: AC
  Administered 2018-01-27: 44 mg via INTRAVENOUS

## 2018-01-27 MED ORDER — FUROSEMIDE 10 MG/ML IJ SOLN
INTRAMUSCULAR | Status: AC
  Filled 2018-01-27: qty 8

## 2018-01-27 NOTE — H&P (Signed)
Patient ID: [name] MRN: [id number] DOB/AGE: 1946-06-12 72 y.o.  Admit date: (Not on file)  Admission Diagnoses:  Chronic Pain Syndrome  HPI:  Pt is scheduled for a SCS Placement on 01/30/18  at [facility].  Pt had a successful trial with Dr. [name] and wishes to proceed with permanent placement.  She is hopeful to be able to decrease pain medication and increase function.   Past Medical History: Past Medical History:  Diagnosis Date  . Arthritis   . Cervical dystonia   . Colitis   . Hypertension     Surgical History: Past Surgical History:  Procedure Laterality Date  . ABDOMINAL HYSTERECTOMY    . CHOLECYSTECTOMY    . KNEE SURGERY    . ROTATOR CUFF REPAIR      Family History: Family History  Problem Relation Age of Onset  . Alzheimer's disease Mother   . Heart disease Father     Social History: Social History   Socioeconomic History  . Marital status: Married    Spouse name: Ronalee Belts  . Number of children: 3  . Years of education: college  . Highest education level: Not on file  Social Needs  . Financial resource strain: Not on file  . Food insecurity - worry: Not on file  . Food insecurity - inability: Not on file  . Transportation needs - medical: Not on file  . Transportation needs - non-medical: Not on file  Occupational History    Employer: RETIRED    Comment: retired  Tobacco Use  . Smoking status: Never Smoker  . Smokeless tobacco: Never Used  Substance and Sexual Activity  . Alcohol use: No  . Drug use: No  . Sexual activity: Not on file  Other Topics Concern  . Not on file  Social History Narrative   Patient lives at home with her husband Ronalee Belts)    Retired    Right handed   Some college   Caffeine three cups daily    Allergies: Gabapentin; Sulfa antibiotics; and Ciprofloxacin  Medications: I have reviewed the patient's current medications.  Vital Signs: No data found.  Radiology: No results found.  Labs: No  results for input(s): WBC, RBC, HCT, PLT in the last 72 hours. No results for input(s): NA, K, CL, CO2, BUN, CREATININE, GLUCOSE, CALCIUM in the last 72 hours. No results for input(s): LABPT, INR in the last 72 hours.  Review of Systems: ROS  Physical Exam: There is no height or weight on file to calculate BMI.  Physical Exam  Constitutional: She is oriented to person, place, and time. She appears well-developed and well-nourished.  Neck: Normal range of motion. Neck supple.  Cardiovascular: Normal rate and regular rhythm.  Respiratory: Effort normal and breath sounds normal.  GI: Soft. Bowel sounds are normal.  Neurological: She is alert and oriented to person, place, and time.  Skin: Skin is warm and dry.  Psychiatric: She has a normal mood and affect. Her behavior is normal. Judgment and thought content normal.   Pain is elicited when arising from a seated position that radiates into the back and both legs does cause a slightly altered gait she does use a walker occasionally there is tenderness to palpation elicited as well as range of motion she does have deconditioning generalized weakness in her lower extremities no sensation changes no motor deficits  Assessment and Plan: Risks and benefits of surgery were discussed with the patient. These include: Infection, bleeding, death, stroke, paralysis, ongoing  or worse pain, need for additional surgery, leak of spinal fluid, Failure of the battery requiring reoperation. Inability to place the paddle requiring the surgery to be aborted. Migration of the lead, failure to obtain results similar to the trial.  Goal of surgery: Reproduce pain relief obtained during the trial. Improved quality of life.  Ronette Deter, PAC for Melina Schools, MD [place] [phone]  No change in patient's clinical examChange in patient's clinical exam.  She continues to have significant back buttock and bilateral lower extremity painHe continues  to have significant back or buttock and bilateral lower extremity pain.. I reviewed the procedure with the patient and her husbandhI've reviewed the procedure with the patient and her husband as well as the risks and as well as the risks and benefits.  All of their questions were addressed.nAll of their quests were addressed. Plan on moving forward with permanent spinal cord stimulator placement.

## 2018-01-27 NOTE — Pre-Procedure Instructions (Signed)
SESILIA POUCHER  01/27/2018      [facility] - [place] - [address] [place] [address] Phone: [phone] Fax: [phone]    Your procedure is scheduled on Thursday February 14.  Report to [facility] Admitting at 5:30 A.M.  Call this number if you have problems the morning of surgery:  [phone]   Remember:  Do not eat food or drink liquids after midnight.  Take these medicines the morning of surgery with A SIP OF WATER:   clonazepam (Klonopin) if needed Levothyroxine (synthroid) Hydrocodone-acetaminophen (NORCO) if needed  7 days prior to surgery STOP taking any Aspirin(unless otherwise instructed by your surgeon), Aleve, Naproxen, Ibuprofen, Motrin, Advil, Goody's, BC's, all herbal medications, fish oil, and all vitamins    Do not wear jewelry, make-up or nail polish.  Do not wear lotions, powders, or perfumes, or deodorant.  Do not shave 48 hours prior to surgery.  Men may shave face and neck.  Do not bring valuables to the hospital.  [facility] is not responsible for any belongings or valuables.  Contacts, dentures or bridgework may not be worn into surgery.  Leave your suitcase in the car.  After surgery it may be brought to your room.  For patients admitted to the hospital, discharge time will be determined by your treatment team.  Patients discharged the day of surgery will not be allowed to drive home.    Special instructions:    [facility]- Preparing For Surgery  Before surgery, you can play an important role. Because skin is not sterile, your skin needs to be as free of germs as possible. You can reduce the number of germs on your skin by washing with CHG (chlorahexidine gluconate) Soap before surgery.  CHG is an antiseptic cleaner which kills germs and bonds with the skin to continue killing germs even after washing.  Please do not use if you have an allergy to  CHG or antibacterial soaps. If your skin becomes reddened/irritated stop using the CHG.  Do not shave (including legs and underarms) for at least 48 hours prior to first CHG shower. It is OK to shave your face.  Please follow these instructions carefully.   1. Shower the NIGHT BEFORE SURGERY and the MORNING OF SURGERY with CHG.   2. If you chose to wash your hair, wash your hair first as usual with your normal shampoo.  3. After you shampoo, rinse your hair and body thoroughly to remove the shampoo.  4. Use CHG as you would any other liquid soap. You can apply CHG directly to the skin and wash gently with a scrungie or a clean washcloth.   5. Apply the CHG Soap to your body ONLY FROM THE NECK DOWN.  Do not use on open wounds or open sores. Avoid contact with your eyes, ears, mouth and genitals (private parts). Wash Face and genitals (private parts)  with your normal soap.  6. Wash thoroughly, paying special attention to the area where your surgery will be performed.  7. Thoroughly rinse your body with warm water from the neck down.  8. DO NOT shower/wash with your normal soap after using and rinsing off the CHG Soap.  9. Pat yourself dry with a CLEAN TOWEL.  10. Wear CLEAN PAJAMAS to bed the night before surgery, wear comfortable clothes the morning of surgery  11. Place CLEAN SHEETS on your bed the night of your  first shower and DO NOT SLEEP WITH PETS.    Day of Surgery: Do not apply any deodorants/lotions. Please wear clean clothes to the hospital/surgery center.      Please read over the following fact sheets that you were given. Coughing and Deep Breathing and Surgical Site Infection Prevention

## 2018-01-28 ENCOUNTER — Other Ambulatory Visit: Payer: Self-pay

## 2018-01-28 ENCOUNTER — Encounter (HOSPITAL_COMMUNITY): Payer: Self-pay

## 2018-01-28 ENCOUNTER — Encounter (HOSPITAL_COMMUNITY)
Admission: RE | Admit: 2018-01-28 | Discharge: 2018-01-28 | Disposition: A | Discharge status: Home / Self Care | Payer: Medicare HMO | Source: Ambulatory Visit | Attending: Orthopedic Surgery | Admitting: Orthopedic Surgery

## 2018-01-28 DIAGNOSIS — K219 Gastro-esophageal reflux disease without esophagitis: Secondary | ICD-10-CM | POA: Diagnosis not present

## 2018-01-28 DIAGNOSIS — N189 Chronic kidney disease, unspecified: Secondary | ICD-10-CM | POA: Diagnosis not present

## 2018-01-28 DIAGNOSIS — Z882 Allergy status to sulfonamides status: Secondary | ICD-10-CM | POA: Diagnosis not present

## 2018-01-28 DIAGNOSIS — Z79899 Other long term (current) drug therapy: Secondary | ICD-10-CM | POA: Diagnosis not present

## 2018-01-28 DIAGNOSIS — G4730 Sleep apnea, unspecified: Secondary | ICD-10-CM | POA: Diagnosis not present

## 2018-01-28 DIAGNOSIS — I129 Hypertensive chronic kidney disease with stage 1 through stage 4 chronic kidney disease, or unspecified chronic kidney disease: Secondary | ICD-10-CM | POA: Diagnosis not present

## 2018-01-28 DIAGNOSIS — M47812 Spondylosis without myelopathy or radiculopathy, cervical region: Secondary | ICD-10-CM | POA: Diagnosis not present

## 2018-01-28 DIAGNOSIS — F419 Anxiety disorder, unspecified: Secondary | ICD-10-CM | POA: Diagnosis not present

## 2018-01-28 DIAGNOSIS — F329 Major depressive disorder, single episode, unspecified: Secondary | ICD-10-CM | POA: Diagnosis not present

## 2018-01-28 DIAGNOSIS — E039 Hypothyroidism, unspecified: Secondary | ICD-10-CM | POA: Diagnosis not present

## 2018-01-28 DIAGNOSIS — Z87891 Personal history of nicotine dependence: Secondary | ICD-10-CM | POA: Diagnosis not present

## 2018-01-28 DIAGNOSIS — Z881 Allergy status to other antibiotic agents status: Secondary | ICD-10-CM | POA: Diagnosis not present

## 2018-01-28 DIAGNOSIS — Z888 Allergy status to other drugs, medicaments and biological substances status: Secondary | ICD-10-CM | POA: Diagnosis not present

## 2018-01-28 DIAGNOSIS — G894 Chronic pain syndrome: Secondary | ICD-10-CM | POA: Diagnosis not present

## 2018-01-28 DIAGNOSIS — M47816 Spondylosis without myelopathy or radiculopathy, lumbar region: Secondary | ICD-10-CM | POA: Diagnosis not present

## 2018-01-28 HISTORY — DX: Sleep apnea, unspecified: G47.30

## 2018-01-28 HISTORY — DX: Gastro-esophageal reflux disease without esophagitis: K21.9

## 2018-01-28 HISTORY — DX: Chronic kidney disease, unspecified: N18.9

## 2018-01-28 HISTORY — DX: Anxiety disorder, unspecified: F41.9

## 2018-01-28 HISTORY — DX: Depression, unspecified: F32.A

## 2018-01-28 HISTORY — DX: Dyspnea, unspecified: R06.00

## 2018-01-28 HISTORY — DX: Major depressive disorder, single episode, unspecified: F32.9

## 2018-01-28 LAB — CBC
HEMATOCRIT: 42.5 % (ref 36.0–46.0)
HEMOGLOBIN: 13.5 g/dL (ref 12.0–15.0)
MCH: 27.2 pg (ref 26.0–34.0)
MCHC: 31.8 g/dL (ref 30.0–36.0)
MCV: 85.5 fL (ref 78.0–100.0)
Platelets: 325 10*3/uL (ref 150–400)
RBC: 4.97 MIL/uL (ref 3.87–5.11)
RDW: 14.6 % (ref 11.5–15.5)
WBC: 10.4 10*3/uL (ref 4.0–10.5)

## 2018-01-28 LAB — SURGICAL PCR SCREEN
MRSA, PCR: NEGATIVE
Staphylococcus aureus: NEGATIVE

## 2018-01-28 NOTE — Progress Notes (Signed)
Pt. Pain score 8-9 in the lobby, when brought back to PAT interview room dropped to 3 while in the recliner. Pt. BP 18'M systolic initially- but after pain score dropped & pt. Comfortable in recliner, BP increased. Pt. Denies all chest concerns, denies flu symptoms. Pt. Followed by Dr. Lazarus Gowda for cysts in her R kidney. Pt. Also followed by Dr. Wynonia Lawman for cardiac ( recent review) , will request records from both.

## 2018-01-29 DIAGNOSIS — M13812 Other specified arthritis, left shoulder: Secondary | ICD-10-CM | POA: Diagnosis not present

## 2018-01-29 DIAGNOSIS — M75122 Complete rotator cuff tear or rupture of left shoulder, not specified as traumatic: Secondary | ICD-10-CM | POA: Diagnosis not present

## 2018-01-29 DIAGNOSIS — M75120 Complete rotator cuff tear or rupture of unspecified shoulder, not specified as traumatic: Secondary | ICD-10-CM | POA: Diagnosis not present

## 2018-01-29 DIAGNOSIS — M7542 Impingement syndrome of left shoulder: Secondary | ICD-10-CM | POA: Diagnosis not present

## 2018-01-29 NOTE — Progress Notes (Addendum)
Anesthesia Chart Review:  Pt is a 72 year old female scheduled for lumbar spinal cord stimulator insertion on 01/30/2018 with Melina Schools, MD  - PCP is [name], MD - Nephrologist is Jamal Maes, MD. Last office visit 11/28/17 - Urologist is Dr. Karsten Ro.  - Saw cardiologist Tollie Eth, MD for pre-op eval. Echo and stress test ordered, results below. Cleared for surgery at last office visit 01/20/18; prn f/u recommended.   PMH includes:  CKD (stage 3), R hydronephrosis, OSA, GERD. Former smoker. BMI 33.2  Medications include: Amlodipine, Lipitor, Benicar, levothyroxine, Prilosec  VS:  BP 98/69   Pulse 100   Temp 36.5 C   Resp 20   Ht 5\' 3"  (1.6 m)   Wt 187 lb 4.8 oz (85 kg)   SpO2 96%   BMI 33.18 kg/m  - BP initially low at 83/38, recheck 82/42.  Pt had increased pain at this time (rated 8-9/10).  After pt sat and rested, and pain decreased, BP increased to 98/69.   Preoperative labs reviewed.   - CBC acceptable for surgery.  - BMET will be obtained day of surgery. Per nephrology notes, baseline Cr ~1.2-1.4.   EKG 12/20/17 (Dr. Thurman Coyer office): sinus rhythm. Reverse R wave progression V2, V3.   NM renal imaging 01/27/18:  - Minimally dilated LEFT renal collecting system without evidence of urinary outflow obstruction. - Dilated RIGHT renal collecting system with adequate washout of tracer following Lasix administration indicating a lack of significant urinary outflow obstruction. - Markedly asymmetric renal function LEFT kidney 77% versus RIGHT kidney 23%.  Nuclear stress test 01/09/18 (Dr. Thurman Coyer office): 1.  Normal Lexiscan Myoview scan with no evidence of ischemia or infarction. 2.  Normal quantitative gated SPECT EF 63% with normal wall motion and normal thickening.   - Low risk scan. Good myocardial perfusion and LV function.  May proceed with surgery.  Echo 12/27/17 (Dr. Thurman Coyer office): 1.  Moderate concentric LVH with normal global wall motion.  Grade I  diastolic dysfunction.  Estimated EF 55%. 2.  Trace tricuspid and pulmonic regurgitation.  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 No renal or ureteral calculi are noted. Bilateral renal cortical scarring is again noted. - No other significant abnormality seen in the abdomen or pelvis.  If labs acceptable day of surgery, I anticipate pt can proceed with surgery as scheduled.   Willeen Cass, FNP-BC [facility]/Anesthesiology Phone: [phone] 01/29/2018 2:04 PM

## 2018-01-29 NOTE — Anesthesia Preprocedure Evaluation (Addendum)
Anesthesia Evaluation  Patient identified by MRN, date of birth, ID band Patient awake    Reviewed: Allergy & Precautions, NPO status , Patient's Chart, lab work & pertinent test results  Airway Mallampati: III  TM Distance: >3 FB Neck ROM: Full    Dental no notable dental hx.    Pulmonary sleep apnea , former smoker,    Pulmonary exam normal breath sounds clear to auscultation       Cardiovascular hypertension, Pt. on medications Normal cardiovascular exam Rhythm:Regular Rate:Normal  ECG: rate 94  Saw cardiologist Tollie Eth, MD for pre-op eval. Echo and stress test ordered, results below. Cleared for surgery at last office visit 01/20/18; prn f/u recommended.   Nuclear stress test 01/09/18 (Dr. Thurman Coyer office): 1.  Normal Lexiscan Myoview scan with no evidence of ischemia or infarction. 2.  Normal quantitative gated SPECT EF 63% with normal wall motion and normal thickening.   - Low risk scan. Good myocardial perfusion and LV function.  May proceed with surgery.  Echo 12/27/17 (Dr. Thurman Coyer office): 1.  Moderate concentric LVH with normal global wall motion.  Grade I diastolic dysfunction.  Estimated EF 55%. 2.  Trace tricuspid and pulmonic regurgitation.     Neuro/Psych PSYCHIATRIC DISORDERS Anxiety Depression Colitis  Neuromuscular disease    GI/Hepatic Neg liver ROS, GERD  Medicated and Controlled,  Endo/Other  Hypothyroidism   Renal/GU CRFRenal diseaseCr: 1.16     Musculoskeletal negative musculoskeletal ROS (+)   Abdominal (+) + obese,   Peds  Hematology HLD   Anesthesia Other Findings Chronic back pain  Reproductive/Obstetrics                            Anesthesia Physical Anesthesia Plan  ASA: III  Anesthesia Plan: General   Post-op Pain Management:    Induction: Intravenous  PONV Risk Score and Plan: 3 and Dexamethasone, Ondansetron and Treatment may vary due to age  or medical condition  Airway Management Planned: Oral ETT  Additional Equipment:   Intra-op Plan:   Post-operative Plan: Extubation in OR  Informed Consent: I have reviewed the patients History and Physical, chart, labs and discussed the procedure including the risks, benefits and alternatives for the proposed anesthesia with the patient or authorized representative who has indicated his/her understanding and acceptance.   Dental advisory given  Plan Discussed with: CRNA  Anesthesia Plan Comments:        Anesthesia Quick Evaluation

## 2018-01-30 ENCOUNTER — Ambulatory Visit (HOSPITAL_COMMUNITY): Payer: Medicare HMO

## 2018-01-30 ENCOUNTER — Observation Stay (HOSPITAL_COMMUNITY)
Admission: RE | Admit: 2018-01-30 | Discharge: 2018-01-31 | Disposition: A | Discharge status: Home / Self Care | Payer: Medicare HMO | Source: Ambulatory Visit | Attending: Orthopedic Surgery | Admitting: Orthopedic Surgery

## 2018-01-30 ENCOUNTER — Encounter (HOSPITAL_COMMUNITY)
Admission: RE | Disposition: A | Discharge status: Home / Self Care | Payer: Self-pay | Source: Ambulatory Visit | Attending: Orthopedic Surgery

## 2018-01-30 ENCOUNTER — Ambulatory Visit (HOSPITAL_COMMUNITY): Payer: Medicare HMO | Admitting: Emergency Medicine

## 2018-01-30 ENCOUNTER — Encounter (HOSPITAL_COMMUNITY): Payer: Self-pay

## 2018-01-30 DIAGNOSIS — F419 Anxiety disorder, unspecified: Secondary | ICD-10-CM | POA: Insufficient documentation

## 2018-01-30 DIAGNOSIS — M47816 Spondylosis without myelopathy or radiculopathy, lumbar region: Secondary | ICD-10-CM | POA: Insufficient documentation

## 2018-01-30 DIAGNOSIS — Z9689 Presence of other specified functional implants: Secondary | ICD-10-CM

## 2018-01-30 DIAGNOSIS — Z882 Allergy status to sulfonamides status: Secondary | ICD-10-CM | POA: Insufficient documentation

## 2018-01-30 DIAGNOSIS — F329 Major depressive disorder, single episode, unspecified: Secondary | ICD-10-CM | POA: Diagnosis not present

## 2018-01-30 DIAGNOSIS — Z981 Arthrodesis status: Secondary | ICD-10-CM | POA: Diagnosis not present

## 2018-01-30 DIAGNOSIS — E039 Hypothyroidism, unspecified: Secondary | ICD-10-CM | POA: Diagnosis not present

## 2018-01-30 DIAGNOSIS — M5441 Lumbago with sciatica, right side: Secondary | ICD-10-CM | POA: Diagnosis not present

## 2018-01-30 DIAGNOSIS — G4730 Sleep apnea, unspecified: Secondary | ICD-10-CM | POA: Diagnosis not present

## 2018-01-30 DIAGNOSIS — I129 Hypertensive chronic kidney disease with stage 1 through stage 4 chronic kidney disease, or unspecified chronic kidney disease: Secondary | ICD-10-CM | POA: Insufficient documentation

## 2018-01-30 DIAGNOSIS — Z881 Allergy status to other antibiotic agents status: Secondary | ICD-10-CM | POA: Insufficient documentation

## 2018-01-30 DIAGNOSIS — G894 Chronic pain syndrome: Secondary | ICD-10-CM | POA: Diagnosis not present

## 2018-01-30 DIAGNOSIS — M47812 Spondylosis without myelopathy or radiculopathy, cervical region: Secondary | ICD-10-CM | POA: Insufficient documentation

## 2018-01-30 DIAGNOSIS — N183 Chronic kidney disease, stage 3 (moderate): Secondary | ICD-10-CM | POA: Diagnosis not present

## 2018-01-30 DIAGNOSIS — Z888 Allergy status to other drugs, medicaments and biological substances status: Secondary | ICD-10-CM | POA: Insufficient documentation

## 2018-01-30 DIAGNOSIS — Z79899 Other long term (current) drug therapy: Secondary | ICD-10-CM | POA: Insufficient documentation

## 2018-01-30 DIAGNOSIS — Z419 Encounter for procedure for purposes other than remedying health state, unspecified: Secondary | ICD-10-CM

## 2018-01-30 DIAGNOSIS — K219 Gastro-esophageal reflux disease without esophagitis: Secondary | ICD-10-CM | POA: Insufficient documentation

## 2018-01-30 DIAGNOSIS — Z87891 Personal history of nicotine dependence: Secondary | ICD-10-CM | POA: Insufficient documentation

## 2018-01-30 DIAGNOSIS — M5442 Lumbago with sciatica, left side: Secondary | ICD-10-CM | POA: Diagnosis not present

## 2018-01-30 DIAGNOSIS — M549 Dorsalgia, unspecified: Secondary | ICD-10-CM | POA: Diagnosis not present

## 2018-01-30 DIAGNOSIS — M961 Postlaminectomy syndrome, not elsewhere classified: Secondary | ICD-10-CM | POA: Diagnosis not present

## 2018-01-30 DIAGNOSIS — Z9889 Other specified postprocedural states: Secondary | ICD-10-CM

## 2018-01-30 DIAGNOSIS — N189 Chronic kidney disease, unspecified: Secondary | ICD-10-CM | POA: Insufficient documentation

## 2018-01-30 DIAGNOSIS — G8929 Other chronic pain: Secondary | ICD-10-CM | POA: Diagnosis not present

## 2018-01-30 HISTORY — PX: SPINAL CORD STIMULATOR INSERTION: SHX5378

## 2018-01-30 LAB — BASIC METABOLIC PANEL
ANION GAP: 12 (ref 5–15)
BUN: 16 mg/dL (ref 6–20)
CO2: 23 mmol/L (ref 22–32)
Calcium: 9 mg/dL (ref 8.9–10.3)
Chloride: 107 mmol/L (ref 101–111)
Creatinine, Ser: 1.1 mg/dL — ABNORMAL HIGH (ref 0.44–1.00)
GFR calc Af Amer: 57 mL/min — ABNORMAL LOW (ref >60)
GFR, EST NON AFRICAN AMERICAN: 49 mL/min — AB (ref >60)
GLUCOSE: 95 mg/dL (ref 65–99)
POTASSIUM: 3.8 mmol/L (ref 3.5–5.1)
Sodium: 142 mmol/L (ref 135–145)

## 2018-01-30 SURGERY — INSERTION, SPINAL CORD STIMULATOR, LUMBAR
Anesthesia: General | Site: Back

## 2018-01-30 MED ORDER — FENTANYL CITRATE (PF) 100 MCG/2ML IJ SOLN
INTRAMUSCULAR | Status: DC | PRN
  Administered 2018-01-30: 50 ug via INTRAVENOUS
  Administered 2018-01-30: 100 ug via INTRAVENOUS

## 2018-01-30 MED ORDER — ROCURONIUM BROMIDE 100 MG/10ML IV SOLN
INTRAVENOUS | Status: DC | PRN
  Administered 2018-01-30: 10 mg via INTRAVENOUS
  Administered 2018-01-30: 20 mg via INTRAVENOUS
  Administered 2018-01-30: 40 mg via INTRAVENOUS

## 2018-01-30 MED ORDER — OXYCODONE HCL 5 MG PO TABS: 5.0000 mg | ORAL_TABLET | Freq: Once | ORAL | Status: DC | PRN

## 2018-01-30 MED ORDER — IRBESARTAN 75 MG PO TABS
150.0000 mg | ORAL_TABLET | Freq: Every day | ORAL | Status: DC
  Administered 2018-01-30 – 2018-01-31 (×2): 150 mg via ORAL
  Filled 2018-01-30 (×2): qty 2

## 2018-01-30 MED ORDER — OXYCODONE-ACETAMINOPHEN 10-325 MG PO TABS: ORAL_TABLET | ORAL | 0 refills | Status: DC

## 2018-01-30 MED ORDER — BUPIVACAINE-EPINEPHRINE 0.25% -1:200000 IJ SOLN
INTRAMUSCULAR | Status: AC
  Filled 2018-01-30: qty 1

## 2018-01-30 MED ORDER — THROMBIN (RECOMBINANT) 20000 UNITS EX SOLR
CUTANEOUS | Status: DC | PRN
  Administered 2018-01-30: 20000 [IU] via TOPICAL

## 2018-01-30 MED ORDER — ACETAMINOPHEN 650 MG RE SUPP: 650.0000 mg | RECTAL | Status: DC | PRN

## 2018-01-30 MED ORDER — CLONAZEPAM 1 MG PO TABS
1.0000 mg | ORAL_TABLET | Freq: Two times a day (BID) | ORAL | Status: DC | PRN
  Administered 2018-01-30 – 2018-01-31 (×2): 1 mg via ORAL
  Filled 2018-01-30 (×2): qty 1

## 2018-01-30 MED ORDER — RALOXIFENE HCL 60 MG PO TABS
60.0000 mg | ORAL_TABLET | Freq: Every evening | ORAL | Status: DC
  Administered 2018-01-30: 60 mg via ORAL
  Filled 2018-01-30: qty 1

## 2018-01-30 MED ORDER — PHENOL 1.4 % MT LIQD: 1.0000 | OROMUCOSAL | Status: DC | PRN

## 2018-01-30 MED ORDER — OXYCODONE HCL 5 MG PO TABS
10.0000 mg | ORAL_TABLET | ORAL | Status: DC | PRN
  Administered 2018-01-30 (×3): 10 mg via ORAL
  Filled 2018-01-30 (×3): qty 2

## 2018-01-30 MED ORDER — BISACODYL 5 MG PO TBEC: 5.0000 mg | DELAYED_RELEASE_TABLET | Freq: Every day | ORAL | Status: DC | PRN

## 2018-01-30 MED ORDER — HYDROCODONE-ACETAMINOPHEN 10-325 MG PO TABS
1.0000 | ORAL_TABLET | ORAL | Status: DC | PRN
  Administered 2018-01-31 (×3): 1 via ORAL
  Filled 2018-01-30 (×3): qty 1

## 2018-01-30 MED ORDER — ACETAMINOPHEN 10 MG/ML IV SOLN
INTRAVENOUS | Status: DC | PRN
  Administered 2018-01-30: 1000 mg via INTRAVENOUS

## 2018-01-30 MED ORDER — PHENYLEPHRINE HCL 10 MG/ML IJ SOLN
INTRAMUSCULAR | Status: DC | PRN
  Administered 2018-01-30 (×2): 100 ug via INTRAVENOUS

## 2018-01-30 MED ORDER — HYDROMORPHONE HCL 1 MG/ML IJ SOLN
INTRAMUSCULAR | Status: AC
  Filled 2018-01-30: qty 1

## 2018-01-30 MED ORDER — SUGAMMADEX SODIUM 200 MG/2ML IV SOLN
INTRAVENOUS | Status: DC | PRN
  Administered 2018-01-30: 170 mg via INTRAVENOUS

## 2018-01-30 MED ORDER — ONDANSETRON HCL 4 MG/2ML IJ SOLN: 4.0000 mg | Freq: Four times a day (QID) | INTRAMUSCULAR | Status: DC | PRN

## 2018-01-30 MED ORDER — PHENYLEPHRINE HCL 10 MG/ML IJ SOLN
INTRAVENOUS | Status: DC | PRN
  Administered 2018-01-30: 50 ug/min via INTRAVENOUS

## 2018-01-30 MED ORDER — CEFAZOLIN SODIUM-DEXTROSE 2-4 GM/100ML-% IV SOLN
2.0000 g | Freq: Three times a day (TID) | INTRAVENOUS | Status: AC
  Administered 2018-01-30 – 2018-01-31 (×2): 2 g via INTRAVENOUS
  Filled 2018-01-30 (×2): qty 100

## 2018-01-30 MED ORDER — MENTHOL 3 MG MT LOZG: 1.0000 | LOZENGE | OROMUCOSAL | Status: DC | PRN

## 2018-01-30 MED ORDER — HEMOSTATIC AGENTS (NO CHARGE) OPTIME
TOPICAL | Status: DC | PRN
  Administered 2018-01-30: 1 via TOPICAL

## 2018-01-30 MED ORDER — PANTOPRAZOLE SODIUM 40 MG PO TBEC
40.0000 mg | DELAYED_RELEASE_TABLET | Freq: Every day | ORAL | Status: DC
  Administered 2018-01-30 – 2018-01-31 (×2): 40 mg via ORAL
  Filled 2018-01-30 (×2): qty 1

## 2018-01-30 MED ORDER — PROPOFOL 10 MG/ML IV BOLUS
INTRAVENOUS | Status: DC | PRN
  Administered 2018-01-30: 150 mg via INTRAVENOUS

## 2018-01-30 MED ORDER — CEFAZOLIN SODIUM-DEXTROSE 2-4 GM/100ML-% IV SOLN
INTRAVENOUS | Status: AC
  Filled 2018-01-30: qty 100

## 2018-01-30 MED ORDER — ONDANSETRON 4 MG PO TBDP: 4.0000 mg | ORAL_TABLET | Freq: Three times a day (TID) | ORAL | 0 refills | Status: DC | PRN

## 2018-01-30 MED ORDER — OXYCODONE HCL 5 MG/5ML PO SOLN: 5.0000 mg | Freq: Once | ORAL | Status: DC | PRN

## 2018-01-30 MED ORDER — ONDANSETRON HCL 4 MG PO TABS: 4.0000 mg | ORAL_TABLET | Freq: Four times a day (QID) | ORAL | Status: DC | PRN

## 2018-01-30 MED ORDER — SODIUM CHLORIDE 0.9% FLUSH
3.0000 mL | Freq: Two times a day (BID) | INTRAVENOUS | Status: DC
  Administered 2018-01-30: 3 mL via INTRAVENOUS

## 2018-01-30 MED ORDER — MAGNESIUM CITRATE PO SOLN
1.0000 | Freq: Once | ORAL | Status: DC | PRN
Start: 2018-01-30 — End: 2018-01-31

## 2018-01-30 MED ORDER — LACTATED RINGERS IV SOLN
INTRAVENOUS | Status: DC | PRN
  Administered 2018-01-30 (×2): via INTRAVENOUS

## 2018-01-30 MED ORDER — METHOCARBAMOL 500 MG PO TABS
500.0000 mg | ORAL_TABLET | Freq: Four times a day (QID) | ORAL | Status: DC | PRN
  Administered 2018-01-30 – 2018-01-31 (×3): 500 mg via ORAL
  Filled 2018-01-30 (×3): qty 1

## 2018-01-30 MED ORDER — LEVOTHYROXINE SODIUM 100 MCG PO TABS
50.0000 ug | ORAL_TABLET | Freq: Every day | ORAL | Status: DC
  Administered 2018-01-31: 50 ug via ORAL
  Filled 2018-01-30: qty 1

## 2018-01-30 MED ORDER — ATORVASTATIN CALCIUM 20 MG PO TABS
40.0000 mg | ORAL_TABLET | Freq: Every evening | ORAL | Status: DC
  Administered 2018-01-30: 40 mg via ORAL
  Filled 2018-01-30: qty 2

## 2018-01-30 MED ORDER — ZOLPIDEM TARTRATE 5 MG PO TABS
5.0000 mg | ORAL_TABLET | Freq: Every day | ORAL | Status: DC
  Administered 2018-01-30: 5 mg via ORAL
  Filled 2018-01-30: qty 1

## 2018-01-30 MED ORDER — METHOCARBAMOL 1000 MG/10ML IJ SOLN
500.0000 mg | Freq: Four times a day (QID) | INTRAMUSCULAR | Status: DC | PRN
  Filled 2018-01-30: qty 5

## 2018-01-30 MED ORDER — OXYCODONE HCL 5 MG PO TABS: 5.0000 mg | ORAL_TABLET | ORAL | Status: DC | PRN

## 2018-01-30 MED ORDER — POLYETHYLENE GLYCOL 3350 17 G PO PACK: 17.0000 g | PACK | Freq: Every day | ORAL | Status: DC | PRN

## 2018-01-30 MED ORDER — SODIUM CHLORIDE 0.9% FLUSH: 3.0000 mL | INTRAVENOUS | Status: DC | PRN

## 2018-01-30 MED ORDER — FENTANYL CITRATE (PF) 250 MCG/5ML IJ SOLN
INTRAMUSCULAR | Status: AC
  Filled 2018-01-30: qty 5

## 2018-01-30 MED ORDER — PROMETHAZINE HCL 25 MG/ML IJ SOLN: 6.2500 mg | INTRAMUSCULAR | Status: DC | PRN

## 2018-01-30 MED ORDER — MORPHINE SULFATE (PF) 4 MG/ML IV SOLN: 1.0000 mg | INTRAVENOUS | Status: DC | PRN

## 2018-01-30 MED ORDER — 0.9 % SODIUM CHLORIDE (POUR BTL) OPTIME
TOPICAL | Status: DC | PRN
  Administered 2018-01-30: 1000 mL

## 2018-01-30 MED ORDER — CEFAZOLIN SODIUM-DEXTROSE 2-4 GM/100ML-% IV SOLN
2.0000 g | INTRAVENOUS | Status: AC
  Administered 2018-01-30: 2 g via INTRAVENOUS

## 2018-01-30 MED ORDER — THROMBIN (RECOMBINANT) 20000 UNITS EX SOLR
CUTANEOUS | Status: AC
  Filled 2018-01-30: qty 20000

## 2018-01-30 MED ORDER — PROPOFOL 10 MG/ML IV BOLUS
INTRAVENOUS | Status: AC
  Filled 2018-01-30: qty 40

## 2018-01-30 MED ORDER — HYDROMORPHONE HCL 1 MG/ML IJ SOLN
0.2500 mg | INTRAMUSCULAR | Status: DC | PRN
  Administered 2018-01-30 (×2): 0.25 mg via INTRAVENOUS

## 2018-01-30 MED ORDER — LACTATED RINGERS IV SOLN
INTRAVENOUS | Status: DC
  Administered 2018-01-30: 14:00:00 via INTRAVENOUS

## 2018-01-30 MED ORDER — AMLODIPINE BESYLATE 5 MG PO TABS
5.0000 mg | ORAL_TABLET | Freq: Every evening | ORAL | Status: DC
  Administered 2018-01-30: 5 mg via ORAL
  Filled 2018-01-30: qty 1

## 2018-01-30 MED ORDER — BUPIVACAINE-EPINEPHRINE 0.25% -1:200000 IJ SOLN
INTRAMUSCULAR | Status: DC | PRN
  Administered 2018-01-30: 20 mL

## 2018-01-30 MED ORDER — ACETAMINOPHEN 325 MG PO TABS
650.0000 mg | ORAL_TABLET | ORAL | Status: DC | PRN
  Administered 2018-01-31: 650 mg via ORAL
  Filled 2018-01-30: qty 2

## 2018-01-30 MED ORDER — ONDANSETRON HCL 4 MG/2ML IJ SOLN
INTRAMUSCULAR | Status: DC | PRN
  Administered 2018-01-30: 4 mg via INTRAVENOUS

## 2018-01-30 MED ORDER — MIDAZOLAM HCL 2 MG/2ML IJ SOLN
INTRAMUSCULAR | Status: AC
  Filled 2018-01-30: qty 2

## 2018-01-30 MED ORDER — DOCUSATE SODIUM 100 MG PO CAPS
100.0000 mg | ORAL_CAPSULE | Freq: Two times a day (BID) | ORAL | Status: DC
  Administered 2018-01-30: 100 mg via ORAL
  Filled 2018-01-30: qty 1

## 2018-01-30 MED ORDER — LIDOCAINE HCL (CARDIAC) 20 MG/ML IV SOLN
INTRAVENOUS | Status: DC | PRN
  Administered 2018-01-30: 30 mg via INTRAVENOUS

## 2018-01-30 MED ORDER — DEXAMETHASONE SODIUM PHOSPHATE 10 MG/ML IJ SOLN
INTRAMUSCULAR | Status: DC | PRN
  Administered 2018-01-30: 10 mg via INTRAVENOUS

## 2018-01-30 SURGICAL SUPPLY — 53 items
CANISTER SUCT 3000ML PPV (MISCELLANEOUS) ×2 IMPLANT
CLSR STERI-STRIP ANTIMIC 1/2X4 (GAUZE/BANDAGES/DRESSINGS) ×2 IMPLANT
COVER PROBE W GEL 5X96 (DRAPES) IMPLANT
COVER SURGICAL LIGHT HANDLE (MISCELLANEOUS) ×2 IMPLANT
DRAPE C-ARM 42X72 X-RAY (DRAPES) ×2 IMPLANT
DRAPE INCISE IOBAN 85X60 (DRAPES) IMPLANT
DRAPE SURG 17X23 STRL (DRAPES) ×2 IMPLANT
DRAPE U-SHAPE 47X51 STRL (DRAPES) ×2 IMPLANT
DRSG OPSITE POSTOP 4X6 (GAUZE/BANDAGES/DRESSINGS) ×4 IMPLANT
DURAPREP 26ML APPLICATOR (WOUND CARE) ×4 IMPLANT
ELECT BLADE 4.0 EZ CLEAN MEGAD (MISCELLANEOUS) ×2
ELECT CAUTERY BLADE 6.4 (BLADE) ×2 IMPLANT
ELECT PENCIL ROCKER SW 15FT (MISCELLANEOUS) ×2 IMPLANT
ELECT REM PT RETURN 9FT ADLT (ELECTROSURGICAL) ×2
ELECTRODE BLDE 4.0 EZ CLN MEGD (MISCELLANEOUS) ×1 IMPLANT
ELECTRODE REM PT RTRN 9FT ADLT (ELECTROSURGICAL) ×1 IMPLANT
GENERATOR IPG SCS EIFU 7.5 (Neuro Prosthesis/Implant) ×2 IMPLANT
GLOVE BIO SURGEON STRL SZ7 (GLOVE) ×2 IMPLANT
GLOVE BIOGEL PI IND STRL 7.0 (GLOVE) ×1 IMPLANT
GLOVE BIOGEL PI IND STRL 8.5 (GLOVE) ×1 IMPLANT
GLOVE BIOGEL PI INDICATOR 7.0 (GLOVE) ×1
GLOVE BIOGEL PI INDICATOR 8.5 (GLOVE) ×1
GLOVE SS N UNI LF 8.5 STRL (GLOVE) ×4 IMPLANT
GOWN STRL REUS W/ TWL LRG LVL3 (GOWN DISPOSABLE) ×2 IMPLANT
GOWN STRL REUS W/TWL 2XL LVL3 (GOWN DISPOSABLE) ×2 IMPLANT
GOWN STRL REUS W/TWL LRG LVL3 (GOWN DISPOSABLE) ×4
KIT BASIN OR (CUSTOM PROCEDURE TRAY) ×2 IMPLANT
KIT ROOM TURNOVER OR (KITS) ×2 IMPLANT
LAMI NARROW PRIPOLE 16CH (Orthopedic Implant) ×2 IMPLANT
LEAD LAMITRODE 60CM 8CH (Lead) ×4 IMPLANT
NEEDLE 22X1 1/2 (OR ONLY) (NEEDLE) ×4 IMPLANT
NEEDLE SPNL 18GX3.5 QUINCKE PK (NEEDLE) ×4 IMPLANT
NS IRRIG 1000ML POUR BTL (IV SOLUTION) ×2 IMPLANT
PACK LAMINECTOMY ORTHO (CUSTOM PROCEDURE TRAY) ×2 IMPLANT
PACK UNIVERSAL I (CUSTOM PROCEDURE TRAY) ×2 IMPLANT
PAD ARMBOARD 7.5X6 YLW CONV (MISCELLANEOUS) ×4 IMPLANT
PROCLAIM ELITE 7IPG W/PAT CONT (Neuro Prosthesis/Implant) ×2 IMPLANT
SPATULA SILICONE BRAIN 10MM (MISCELLANEOUS) ×2 IMPLANT
SPONGE LAP 4X18 X RAY DECT (DISPOSABLE) IMPLANT
SPONGE SURGIFOAM ABS GEL 100 (HEMOSTASIS) ×2 IMPLANT
STAPLER VISISTAT 35W (STAPLE) ×2 IMPLANT
SURGIFLO W/THROMBIN 8M KIT (HEMOSTASIS) ×2 IMPLANT
SUT BONE WAX W31G (SUTURE) ×2 IMPLANT
SUT ETHIBOND 2 OS 4 DA (SUTURE) ×2 IMPLANT
SUT MNCRL AB 3-0 PS2 18 (SUTURE) ×4 IMPLANT
SUT VIC AB 1 CT1 18XCR BRD 8 (SUTURE) ×2 IMPLANT
SUT VIC AB 1 CT1 8-18 (SUTURE) ×2
SUT VIC AB 2-0 CT1 18 (SUTURE) ×2 IMPLANT
SYR BULB IRRIGATION 50ML (SYRINGE) ×2 IMPLANT
SYR CONTROL 10ML LL (SYRINGE) ×2 IMPLANT
TOWEL OR 17X24 6PK STRL BLUE (TOWEL DISPOSABLE) ×2 IMPLANT
TOWEL OR 17X26 10 PK STRL BLUE (TOWEL DISPOSABLE) ×2 IMPLANT
WATER STERILE IRR 1000ML POUR (IV SOLUTION) ×2 IMPLANT

## 2018-01-30 NOTE — Brief Op Note (Signed)
01/30/2018  10:22 AM  PATIENT:  [name]  72 y.o. female  PRE-OPERATIVE DIAGNOSIS:  Chronic back pain  POST-OPERATIVE DIAGNOSIS:  Chronic back pain  PROCEDURE:  Procedure(s) with comments: LUMBAR SPINAL CORD STIMULATOR INSERTION (N/A) - 2.5 hrs  SURGEON:  Surgeon(s) and Role:    Melina Schools, MD - Primary  PHYSICIAN ASSISTANT:   ASSISTANTS: Carmen Mayo   ANESTHESIA:   general  EBL:  30 mL   BLOOD ADMINISTERED:none  DRAINS: none   LOCAL MEDICATIONS USED:  MARCAINE     SPECIMEN:  No Specimen  DISPOSITION OF SPECIMEN:  N/A  COUNTS:  YES  TOURNIQUET:  * No tourniquets in log *  DICTATION: .Dragon Dictation  PLAN OF CARE: Admit for overnight observation  PATIENT DISPOSITION:  PACU - hemodynamically stable.

## 2018-01-30 NOTE — Transfer of Care (Signed)
Immediate Anesthesia Transfer of Care Note  Patient: [name]  Procedure(s) Performed: LUMBAR SPINAL CORD STIMULATOR INSERTION (N/A Back)  Patient Location: PACU  Anesthesia Type:General  Level of Consciousness: awake and alert   Airway & Oxygen Therapy: Patient Spontanous Breathing and Patient connected to nasal cannula oxygen  Post-op Assessment: Report given to RN and Post -op Vital signs reviewed and stable  Post vital signs: Reviewed and stable  Last Vitals:  Vitals:   01/30/18 0547 01/30/18 1030  BP: (!) 141/78   Pulse: 98   Resp: 20   Temp: 36.4 C (!) (P) 36.1 C  SpO2: 97%     Last Pain:  Vitals:   01/30/18 1030  TempSrc:   PainSc: (P) Asleep      Patients Stated Pain Goal: 3 (43/27/61 4709)  Complications: No apparent anesthesia complications

## 2018-01-30 NOTE — Discharge Instructions (Signed)
Lumbar Diskectomy, Care After °Refer to this sheet in the next few weeks. These instructions provide you with information about caring for yourself after your procedure. Your health care provider may also give you more specific instructions. Your treatment has been planned according to current medical practices, but problems sometimes occur. Call your health care provider if you have any problems or questions after your procedure. °What can I expect after the procedure? °After the procedure, it is common to have: °· Pain. °· Numbness. °· Weakness. ° °Follow these instructions at home: °Medicines °· Take medicines only as directed by your health care provider. °· If you were prescribed an antibiotic medicine, finish all of it even if you start to feel better. °Incision care °· There are many different ways to close and cover an incision, including stitches (sutures), skin glue, and adhesive strips. Follow your health care provider's instructions about: °? Incision care. °? Bandage (dressing) changes and removal. °? Incision closure removal. °· Check your incision area every day for signs of infection. If you cannot see your incision, have someone check it for you. Watch for: °? Redness, swelling, or pain. °? Fluid, blood, or pus. °Activity °· Avoid sitting for longer than 20 minutes at a time or as directed by your health care provider. °· Do not climb stairs more than once each day until your health care provider approves. °· Do not bend at your waist. To pick things up, bend your knees. °· Do not lift anything that is heavier than 10 lb (4.5 kg) or as directed by your health care provider. °· Do not drive a car until your health care provider approves. °· Ask your health care provider when you may return to your normal activities, such as playing sports and going back to work. °· Work with your physical therapist to learn safe movement and exercises to help healing. Do these exercises as directed. °· Take short  walks often. °General instructions °· Do not use any tobacco products, including cigarettes, chewing tobacco, or electronic cigarettes. If you need help quitting, ask your health care provider. °· Follow your health care provider’s instructions about bathing. Do not take baths, shower, swim, or use a hot tub until your health care provider approves. °· Wear your back brace as directed by your health care provider. °· To prevent constipation: °? Drink enough fluid to keep your urine clear or pale yellow. °? Eat plenty of fruits, vegetables, and whole grains. °· Keep all follow-up visits as directed by your health care provider. This is important. This includes any follow-up visits with your physical therapist. °Contact a health care provider if: °· You have a fever. °· You have redness, swelling, or pain in your incision area. °· Your pain is not controlled with medicine. °· You have pain, numbness, or weakness that lasts longer than three weeks after surgery. °· You become constipated. °Get help right away if: °· You have fluid, blood, or pus coming from your incision. °· You have increasing pain, numbness, or weakness. °· You lose control of when you urinate or have a bowel movement (incontinence). °· You have chest pain. °· You have trouble breathing. °This information is not intended to replace advice given to you by your health care provider. Make sure you discuss any questions you have with your health care provider. °Document Released: 11/07/2004 Document Revised: 05/10/2016 Document Reviewed: 07/28/2014 °Elsevier Interactive Patient Education © 2018 [employer] ° °

## 2018-01-30 NOTE — Op Note (Signed)
Operative report.  Preoperative diagnosis: Chronic pain syndrome.  Postoperative diagnosis same.  Operative procedure: Spinal cord stimulator implantation.  First assistant: Ronette Deter, [place].  Implant system: Abbott spine -  Lamitrode S8.  Proclaim 7 battery Laminotomy site: T10  Indications: This is a very pleasant 72 year old woman who is had significant progressive pain, and loss in quality of life.  Despite conservative measures continues to suffer.  Ultimately she had a trial of spinal cord stimulator lead placed and had excellent resolution of her symptoms, and improved quality of life.  As a result of the successful trial she was referred to me for definitive implantation.  All appropriate risks benefits and alternatives to surgery were discussed with the patient and consent was obtained.  Operative report: Patient was brought the operating room placed upon the operating room table.  After successful induction of general anesthesia and endotracheally patient teds SCDs and a Foley were inserted.  Patient was turned prone onto the Wilson frame and all bony prominences well-padded.  The thoracolumbar spine was prepped and draped in a standard fashion.  Timeout was then taken to confirm patient procedure and all other important data.  Using lateral and AP fluoroscopy I identified the T10 pedicle and marked out an incision spanning from these inferior aspect of T10 to the superior aspect of T12.  I infiltrated this incision site with quarter percent Marcaine with epinephrine and then made a midline incision.  Sharply dissected down to the deep fascia.  The deep fascia was then sharply incised and I stripped the paraspinal muscles to expose the inferior portion of the T10 lamina and the entire T11 lamina.  I then re-confirmed that I was at the T10 level using both lateral fluoroscopy (counting up from L5) and AP fluoroscopy identifying the T12 rib/vertebral body.  Once this was confirmed in both  planes I then performed a laminotomy of T10.  The inferior portion of the spinous process was removed with a double-action rongeur and then a laminotomy was performed with a 2 mm Kerrison punch.  I then gently dissected through the central raphae of the ligamentum flavum to create a plane between the ligamentum flavum and the epidural fat.  I then used my 1 and 2 mm Kerrison punch to remove the ligamentum flavum.  I then gently dissected and mobilized the epidural fat to expose the dorsal surface of the thecal sac.  At this point I was able to pass the dural spatula to about the midportion of the T9 vertebral body.  Having successfully been able to pass the spatula and there was no significant distance I initially open the tripole lead.  However when I attempted to pass this I met resistance.  I had difficulty passing it beyond the superior portion of the T10 lamina.  In addition it was beginning to rotate off to the right-hand side.  After failed attempts I elected to use the lamitrode S8 this was narrower and easily passed all the way to the inferior portion of T8.  In order to slightly increase the overall coverage I attempted to place a second S8 lead but this was unsuccessful.  At this point I confirmed with the spinal cord stimulator rep that the single S8 lead properly spanned the area for maximum coverage for pain relief.  When she confirm this I then secured the leads directly to the T11 spinous process with a #1 Ethibond suture.  I then wrapped the lead around the T11 spinous process in order to  decrease the overall risk of migration.  This wound was then copiously irrigated with normal saline hemostasis was obtained using bipolar cautery and FloSeal.  Second incision was made over the left gluteal region and then I bluntly dissected down about 2-1/2 cm and created a pocket for the battery.  Once I had the battery pocket created I then used a submuscular passing device to pass the lead from the thoracic  wound to the gluteal wound.  I then secured the lead into the battery and made sure torqued it so was a properly attached.  The system was then tested by the rep and was functioning without difficulty.  I then placed the battery into the pocket and made sure the excess lead was wrapped in the undersurface of the battery.  I then secured the lead to the deep fascia of that wound with 2 #1 Vicryl sutures.  At this point all wounds were copiously irrigated with normal saline.  I then closed the wounds sequentially with interrupted #1 Vicryl suture, 2-0 Vicryl suture, and 3-0 Monocryl.  Steri-Strips dry rings were applied and the patient was ultimately extubated transferred to the PACU without incident.  The end of the case all needle sponge counts were correct.

## 2018-01-30 NOTE — Anesthesia Postprocedure Evaluation (Signed)
Anesthesia Post Note  Patient: [name]  Procedure(s) Performed: LUMBAR SPINAL CORD STIMULATOR INSERTION (N/A Back)     Patient location during evaluation: PACU Anesthesia Type: General Level of consciousness: awake and alert Pain management: pain level controlled Vital Signs Assessment: post-procedure vital signs reviewed and stable Respiratory status: spontaneous breathing, nonlabored ventilation, respiratory function stable and patient connected to nasal cannula oxygen Cardiovascular status: blood pressure returned to baseline and stable Postop Assessment: no apparent nausea or vomiting Anesthetic complications: no    Last Vitals:  Vitals:   01/30/18 1230 01/30/18 1259  BP: (!) 149/78 (!) 144/87  Pulse: 78 80  Resp: 10 16  Temp:  (!) 36.1 C  SpO2: 96% 96%    Last Pain:  Vitals:   01/30/18 1145  TempSrc:   PainSc: Asleep                 Ryan P Ellender

## 2018-01-30 NOTE — Evaluation (Signed)
Physical Therapy Evaluation Patient Details Name: [name] MRN: [id number] DOB: [birth year] Today's Date: 01/30/2018   History of Present Illness  Pt is a 72 y/o female s/p lumbar spinal cord stimulator insertion. PMH includes HTN, OSA, and Anxiety.   Clinical Impression  Patient is s/p above surgery resulting in the deficits listed below (see PT Problem List). Pt limited in gait tolerance secondary to pain. Also very unsteady without use of AD requiring min A. Improved steadiness noted with RW. Educated about precautions. Anticipate pt will progress well once pain controlled. Patient will benefit from skilled PT to increase their independence and safety with mobility (while adhering to their precautions) to allow discharge to the venue listed below.     Follow Up Recommendations No PT follow up;Supervision for mobility/OOB    Equipment Recommendations  None recommended by PT    Recommendations for Other Services       Precautions / Restrictions Precautions Precautions: Back Precaution Booklet Issued: No Precaution Comments: Verbally reviewed back precautions with pt.  Restrictions Weight Bearing Restrictions: No      Mobility  Bed Mobility Overal bed mobility: Needs Assistance Bed Mobility: Sit to Sidelying;Rolling Rolling: Supervision       Sit to sidelying: Min assist General bed mobility comments: Min A for LE lift assist for return to sidelying. Verbal cues for use of log roll technique.   Transfers Overall transfer level: Needs assistance Equipment used: 1 person hand held assist Transfers: Sit to/from Stand Sit to Stand: Min assist         General transfer comment: Min A for lift assist and steadying assist.   Ambulation/Gait Ambulation/Gait assistance: Min assist;Min guard Ambulation Distance (Feet): 125 Feet Assistive device: Rolling walker (2 wheeled);1 person hand held assist Gait Pattern/deviations: Step-through pattern;Decreased stride  length;Staggering right Gait velocity: Decreased  Gait velocity interpretation: Below normal speed for age/gender General Gait Details: Slow, unsteady gait without use of AD. Had LOB X2 with HHA and required min A for steadying. Had RN get RW, and pt with improved balance, requiring min guard for safety. Educated about use of RW at home. Educated about generalized walking program to perform at home. Further distance limited by pain.   Stairs            Wheelchair Mobility    Modified Rankin (Stroke Patients Only)       Balance Overall balance assessment: Needs assistance Sitting-balance support: No upper extremity supported;Feet supported Sitting balance-Leahy Scale: Good     Standing balance support: During functional activity;Single extremity supported;Bilateral upper extremity supported Standing balance-Leahy Scale: Poor Standing balance comment: Reliant on UE and external support for balance                              Pertinent Vitals/Pain Pain Assessment: Faces Faces Pain Scale: Hurts even more Pain Location: back  Pain Descriptors / Indicators: Aching;Operative site guarding Pain Intervention(s): Limited activity within patient's tolerance;Monitored during session;Repositioned    Home Living Family/patient expects to be discharged to:: Private residence Living Arrangements: Spouse/significant other Available Help at Discharge: Family;Available 24 hours/day Type of Home: House Home Access: Stairs to enter Entrance Stairs-Rails: Right;Left;Can reach both Entrance Stairs-Number of Steps: 3 Home Layout: Two level;Able to live on main level with bedroom/bathroom Home Equipment: Gilford Rile - 2 wheels;Cane - single point;Bedside commode;Wheelchair - manual;Other (comment)(bed rails )      Prior Function Level of Independence: Needs assistance   Gait /  Transfers Assistance Needed: Used RW for longer distances, however, was otherwise independent   ADL's /  Homemaking Assistance Needed: Pt's husband performed cleaning, cooking, and other IADLs        Hand Dominance        Extremity/Trunk Assessment   Upper Extremity Assessment Upper Extremity Assessment: Defer to OT evaluation    Lower Extremity Assessment Lower Extremity Assessment: Generalized weakness       Communication   Communication: No difficulties  Cognition Arousal/Alertness: Awake/alert Behavior During Therapy: WFL for tasks assessed/performed Overall Cognitive Status: Within Functional Limits for tasks assessed                                        General Comments General comments (skin integrity, edema, etc.): Pt's husband and daughter present during session.     Exercises     Assessment/Plan    PT Assessment Patient needs continued PT services  PT Problem List Decreased strength;Decreased balance;Decreased activity tolerance;Decreased mobility;Decreased knowledge of use of DME;Decreased knowledge of precautions;Pain       PT Treatment Interventions DME instruction;Gait training;Therapeutic activities;Stair training;Functional mobility training;Therapeutic exercise;Balance training;Neuromuscular re-education;Patient/family education    PT Goals (Current goals can be found in the Care Plan section)  Acute Rehab PT Goals Patient Stated Goal: to decrease pain  PT Goal Formulation: With patient Time For Goal Achievement: 02/06/18 Potential to Achieve Goals: Good    Frequency Min 5X/week   Barriers to discharge        Co-evaluation               AM-PAC PT "6 Clicks" Daily Activity  Outcome Measure Difficulty turning over in bed (including adjusting bedclothes, sheets and blankets)?: A Little Difficulty moving from lying on back to sitting on the side of the bed? : Unable Difficulty sitting down on and standing up from a chair with arms (e.g., wheelchair, bedside commode, etc,.)?: Unable Help needed moving to and from a bed to  chair (including a wheelchair)?: A Little Help needed walking in hospital room?: A Little Help needed climbing 3-5 steps with a railing? : A Lot 6 Click Score: 13    End of Session Equipment Utilized During Treatment: Gait belt Activity Tolerance: Patient limited by pain Patient left: in bed;with call bell/phone within reach;with family/visitor present Nurse Communication: Mobility status PT Visit Diagnosis: Unsteadiness on feet (R26.81);Other abnormalities of gait and mobility (R26.89);Pain Pain - part of body: (back )    Time: 1610-9604 PT Time Calculation (min) (ACUTE ONLY): 16 min   Charges:   PT Evaluation $PT Eval Low Complexity: 1 Low     PT G Codes:        Leighton Ruff, PT, DPT  Acute Rehabilitation Services  Pager: [phone]   Rudean Hitt 01/30/2018, 3:56 PM

## 2018-01-31 DIAGNOSIS — G894 Chronic pain syndrome: Secondary | ICD-10-CM | POA: Diagnosis not present

## 2018-01-31 DIAGNOSIS — I129 Hypertensive chronic kidney disease with stage 1 through stage 4 chronic kidney disease, or unspecified chronic kidney disease: Secondary | ICD-10-CM | POA: Diagnosis not present

## 2018-01-31 DIAGNOSIS — M47816 Spondylosis without myelopathy or radiculopathy, lumbar region: Secondary | ICD-10-CM | POA: Diagnosis not present

## 2018-01-31 DIAGNOSIS — G4730 Sleep apnea, unspecified: Secondary | ICD-10-CM | POA: Diagnosis not present

## 2018-01-31 DIAGNOSIS — F419 Anxiety disorder, unspecified: Secondary | ICD-10-CM | POA: Diagnosis not present

## 2018-01-31 DIAGNOSIS — M47812 Spondylosis without myelopathy or radiculopathy, cervical region: Secondary | ICD-10-CM | POA: Diagnosis not present

## 2018-01-31 DIAGNOSIS — F329 Major depressive disorder, single episode, unspecified: Secondary | ICD-10-CM | POA: Diagnosis not present

## 2018-01-31 DIAGNOSIS — K219 Gastro-esophageal reflux disease without esophagitis: Secondary | ICD-10-CM | POA: Diagnosis not present

## 2018-01-31 DIAGNOSIS — E039 Hypothyroidism, unspecified: Secondary | ICD-10-CM | POA: Diagnosis not present

## 2018-01-31 NOTE — Progress Notes (Signed)
Physical Therapy Treatment Patient Details Name: [name] MRN: [id number] DOB: 1945/12/18 Today's Date: 01/31/2018    History of Present Illness Pt is a 72 y/o female s/p lumbar spinal cord stimulator insertion. PMH includes HTN, OSA, and Anxiety.     PT Comments    Pt progressing towards physical therapy goals. Was limited this session by pain - mainly headache. RN aware and pain meds were requested prior to session beginning. Pt was able to complete stair training and was educated on precautions, car transfer, and safe mobility progression. Will continue to follow and progress as able per POC.    Follow Up Recommendations  No PT follow up;Supervision for mobility/OOB     Equipment Recommendations  None recommended by PT    Recommendations for Other Services       Precautions / Restrictions Precautions Precautions: Back Precaution Booklet Issued: No Precaution Comments: Verbally reviewed back precautions with pt.  Restrictions Weight Bearing Restrictions: No    Mobility  Bed Mobility Overal bed mobility: Needs Assistance Bed Mobility: Sit to Sidelying;Sidelying to Sit;Rolling Rolling: Supervision Sidelying to sit: Supervision     Sit to sidelying: Min guard General bed mobility comments: Step-by-step instruction for proper log roll technique. Pt required min guard assist for LE elevation back into bed at end of session.   Transfers Overall transfer level: Needs assistance Equipment used: Rolling walker (2 wheeled) Transfers: Sit to/from Stand Sit to Stand: Supervision         General transfer comment: VC's for hand placement on seated surface for safety. Pt was able to power up to full stand without difficulty, however increased time required due to pain.   Ambulation/Gait Ambulation/Gait assistance: Min guard Ambulation Distance (Feet): 150 Feet Assistive device: Rolling walker (2 wheeled) Gait Pattern/deviations: Step-through pattern;Decreased  stride length Gait velocity: Decreased  Gait velocity interpretation: Below normal speed for age/gender General Gait Details: Slow but generally steady with the RW. Distance limited by pain.    Stairs Stairs: Yes   Stair Management: Two rails;Step to pattern;Forwards Number of Stairs: 3 General stair comments: VC's for sequencing and general safety. Pt was able to complete without difficulty however appeared very guarded with each step up.   Wheelchair Mobility    Modified Rankin (Stroke Patients Only)       Balance Overall balance assessment: Needs assistance Sitting-balance support: No upper extremity supported;Feet supported Sitting balance-Leahy Scale: Good     Standing balance support: During functional activity;Single extremity supported;Bilateral upper extremity supported Standing balance-Leahy Scale: Poor Standing balance comment: Reliant on UE and external support for balance                             Cognition Arousal/Alertness: Awake/alert Behavior During Therapy: WFL for tasks assessed/performed Overall Cognitive Status: Within Functional Limits for tasks assessed                                        Exercises      General Comments        Pertinent Vitals/Pain Pain Assessment: Faces Faces Pain Scale: Hurts whole lot Pain Location: back and headache Pain Descriptors / Indicators: Aching;Operative site guarding;Headache Pain Intervention(s): Limited activity within patient's tolerance;Monitored during session;Repositioned    Home Living  Prior Function            PT Goals (current goals can now be found in the care plan section) Acute Rehab PT Goals Patient Stated Goal: to decrease pain  PT Goal Formulation: With patient Time For Goal Achievement: 02/06/18 Potential to Achieve Goals: Good Progress towards PT goals: Progressing toward goals    Frequency    Min 5X/week       PT Plan Current plan remains appropriate    Co-evaluation              AM-PAC PT "6 Clicks" Daily Activity  Outcome Measure  Difficulty turning over in bed (including adjusting bedclothes, sheets and blankets)?: A Little Difficulty moving from lying on back to sitting on the side of the bed? : Unable Difficulty sitting down on and standing up from a chair with arms (e.g., wheelchair, bedside commode, etc,.)?: Unable Help needed moving to and from a bed to chair (including a wheelchair)?: A Little Help needed walking in hospital room?: A Little Help needed climbing 3-5 steps with a railing? : A Lot 6 Click Score: 13    End of Session Equipment Utilized During Treatment: Gait belt Activity Tolerance: Patient limited by pain Patient left: in bed;with call bell/phone within reach;with family/visitor present Nurse Communication: Mobility status PT Visit Diagnosis: Unsteadiness on feet (R26.81);Other abnormalities of gait and mobility (R26.89);Pain Pain - part of body: (back )     Time: 1103-1594 PT Time Calculation (min) (ACUTE ONLY): 10 min  Charges:  $Gait Training: 8-22 mins                    G Codes:       Rolinda Roan, PT, DPT Acute Rehabilitation Services Pager: [phone]    Thelma Comp 01/31/2018, 11:22 AM

## 2018-01-31 NOTE — Progress Notes (Signed)
    Subjective: Procedure(s) (LRB): LUMBAR SPINAL CORD STIMULATOR INSERTION (N/A) 1 Day Post-Op  Patient reports pain as 2 on 0-10 scale.  Reports decreased leg pain reports incisional back pain   Positive void Negative bowel movement Positive flatus Negative chest pain or shortness of breath  Objective: Vital signs in last 24 hours: Temp:  [97 F (36.1 C)-98.6 F (37 C)] 98.6 F (37 C) (02/15 0325) Pulse Rate:  [76-96] 96 (02/15 0325) Resp:  [9-18] 18 (02/15 0325) BP: (124-151)/(62-88) 124/62 (02/15 0325) SpO2:  [94 %-100 %] 97 % (02/15 0325)  Intake/Output from previous day: 02/14 0701 - 02/15 0700 In: 2010 [P.O.:360; I.V.:1400; IV Piggyback:100] Out: 630 [Urine:600; Blood:30]  Labs: Recent Labs    01/28/18 1113  WBC 10.4  RBC 4.97  HCT 42.5  PLT 325   Recent Labs    01/30/18 0608  NA 142  K 3.8  CL 107  CO2 23  BUN 16  CREATININE 1.10*  GLUCOSE 95  CALCIUM 9.0   No results for input(s): LABPT, INR in the last 72 hours.  Physical Exam: Neurologically intact ABD soft Intact pulses distally Incision: dressing C/D/I Compartment soft Body mass index is 33.18 kg/m.   Assessment/Plan: Patient stable  xrays n/a Continue mobilization with physical therapy Continue care  Up with therapy  Doing well [facility] rep to activat device this AM - ok for d/c afterwards  Melina Schools, MD [place] [phone]

## 2018-01-31 NOTE — Progress Notes (Signed)
Patient alert and oriented, mae's well, voiding adequate amount of urine, swallowing without difficulty,  c/o mild pain at time of discharge and medication given prior to discharged. Patient discharged home with family. Script and discharged instructions given to patient. Patient and family stated understanding of instructions given. Patient has an appointment with Dr. Rolena Infante

## 2018-01-31 NOTE — Evaluation (Signed)
Occupational Therapy Evaluation Patient Details Name: [name] MRN: [id number] DOB: 02-18-1946 Today's Date: 01/31/2018    History of Present Illness Pt is a 72 y/o female s/p lumbar spinal cord stimulator insertion. PMH includes HTN, OSA, and Anxiety.    Clinical Impression   Patient evaluated by Occupational Therapy with no further acute OT needs identified. All education has been completed and the patient has no further questions. See below for any follow-up Occupational Therapy or equipment needs. OT to sign off. Thank you for referral.      Follow Up Recommendations  No OT follow up    Equipment Recommendations  None recommended by OT    Recommendations for Other Services       Precautions / Restrictions Precautions Precautions: Back Precaution Booklet Issued: No Precaution Comments: reviewed adls regarding the adls Restrictions Weight Bearing Restrictions: No      Mobility Bed Mobility Overal bed mobility: Needs Assistance Bed Mobility: Sit to Sidelying;Sidelying to Sit;Rolling Rolling: Supervision Sidelying to sit: Supervision          Transfers Overall transfer level: Needs assistance Equipment used: Rolling walker (2 wheeled) Transfers: Sit to/from Stand Sit to Stand: Supervision              Balance                                           ADL either performed or assessed with clinical judgement   ADL Overall ADL's : Modified independent                                       General ADL Comments: pt able to cross bil le. observed with toileting task. educated on shower transfer and hygiene. pt educated on avoiding twisting and use of wet wipes   Back handout provided and reviewed adls in detail. Pt educated on: c set an alarm at night for medication, avoid sitting for long periods of time, correct bed positioning for sleeping, correct sequence for bed mobility, avoiding lifting more than 5 pounds  and never wash directly over incision. Educated on animal care and safety with back precautions All education is complete and patient indicates understanding.    Vision Baseline Vision/History: Wears glasses Wears Glasses: At all times       Perception     Praxis      Pertinent Vitals/Pain Pain Assessment: Faces Faces Pain Scale: Hurts whole lot Pain Location: back and headache Pain Descriptors / Indicators: Aching;Operative site guarding;Headache Pain Intervention(s): Monitored during session;Premedicated before session;Repositioned     Hand Dominance Right   Extremity/Trunk Assessment Upper Extremity Assessment Upper Extremity Assessment: Overall WFL for tasks assessed   Lower Extremity Assessment Lower Extremity Assessment: Overall WFL for tasks assessed   Cervical / Trunk Assessment Cervical / Trunk Assessment: Other exceptions Cervical / Trunk Exceptions: s/p surg   Communication Communication Communication: No difficulties   Cognition Arousal/Alertness: Awake/alert Behavior During Therapy: WFL for tasks assessed/performed Overall Cognitive Status: Within Functional Limits for tasks assessed                                     General Comments       Exercises  Shoulder Instructions      Home Living Family/patient expects to be discharged to:: Private residence Living Arrangements: Spouse/significant other Available Help at Discharge: Family;Available 24 hours/day Type of Home: House Home Access: Stairs to enter [employer] of Steps: 3 Entrance Stairs-Rails: Right;Left;Can reach both Home Layout: Two level;Able to live on main level with bedroom/bathroom     Bathroom Shower/Tub: Occupational psychologist: Standard     Home Equipment: Environmental consultant - 2 wheels;Cane - single point;Bedside commode;Wheelchair - manual;Other (comment)   Additional Comments: has 2 dogs at home . asking about holding 4 pound yorkie       Prior Functioning/Environment Level of Independence: Needs assistance  Gait / Transfers Assistance Needed: Used RW for longer distances, however, was otherwise independent  ADL's / Homemaking Assistance Needed: Pt's husband performed cleaning, cooking, and other IADLs            OT Problem List:        OT Treatment/Interventions:      OT Goals(Current goals can be found in the care plan section) Acute Rehab OT Goals Patient Stated Goal: to decrease pain   OT Frequency:     Barriers to D/C:            Co-evaluation              AM-PAC PT "6 Clicks" Daily Activity     Outcome Measure Help from another person eating meals?: None Help from another person taking care of personal grooming?: None Help from another person toileting, which includes using toliet, bedpan, or urinal?: None Help from another person bathing (including washing, rinsing, drying)?: None Help from another person to put on and taking off regular upper body clothing?: None Help from another person to put on and taking off regular lower body clothing?: None 6 Click Score: 24   End of Session Equipment Utilized During Treatment: Gait belt;Rolling walker Nurse Communication: Mobility status;Precautions  Activity Tolerance: Patient tolerated treatment well Patient left: in bed;with call bell/phone within reach;with family/visitor present                   Time: 0912-0926 OT Time Calculation (min): 14 min Charges:  OT General Charges $OT Visit: 1 Visit OT Evaluation $OT Eval Moderate Complexity: 1 Mod G-Codes:      Jeri Modena   OTR/L Pager: [phone] Office: [phone] .   Parke Poisson B 01/31/2018, 3:20 PM

## 2018-02-03 NOTE — Discharge Summary (Signed)
Physician Discharge Summary  Patient ID: [name] MRN: [id number] DOB/AGE: 05-08-1946 72 y.o.  Admit date: 01/30/2018 Discharge date: 01/31/18  Admission Diagnoses:  Chronic pain syndrom  Discharge Diagnoses:  Active Problems:   S/P insertion of spinal cord stimulator   Past Medical History:  Diagnosis Date  . Anxiety   . Arthritis    cerv & lumbar arthritis  . Cervical dystonia   . Chronic kidney disease    R cyst, obstruction- Dr. Lorrene Reid seen pt. 12/2017  . Colitis   . Depression    [name] - sees counsellor, no meds.  Marland Kitchen Dyspnea   . GERD (gastroesophageal reflux disease)   . Hypertension   . Sleep apnea    told that she has apnea - on sleep study, cannot tolerate     Surgeries: Procedure(s): LUMBAR SPINAL CORD STIMULATOR INSERTION on 01/30/2018   Consultants (if any):   Discharged Condition: Improved  Hospital Course: [name] is an 72 y.o. female who was admitted 01/30/2018 with a diagnosis of Chronic pain syndrome and went to the operating room on 01/30/2018 and underwent the above named procedures.  Post op day one pt reports low level of pain.  Pt is ambulating in hallway.  Pt is cleared by PT for DC.  Pt denies nausea.  Pt met with [facility] rep.   She was given perioperative antibiotics:  Anti-infectives (From admission, onward)   Start     Dose/Rate Route Frequency Ordered Stop   01/30/18 1600  ceFAZolin (ANCEF) IVPB 2g/100 mL premix     2 g 200 mL/hr over 30 Minutes Intravenous Every 8 hours 01/30/18 1301 01/31/18 0140   01/30/18 0538  ceFAZolin (ANCEF) 2-4 GM/100ML-% IVPB    Comments:  [name]   : cabinet override      01/30/18 0538 01/30/18 0805   01/30/18 0535  ceFAZolin (ANCEF) IVPB 2g/100 mL premix     2 g 200 mL/hr over 30 Minutes Intravenous 30 min pre-op 01/30/18 0535 01/30/18 0805    .  She was given sequential compression devices, early ambulation, and TED for DVT prophylaxis.  She benefited maximally from the hospital  stay and there were no complications.    Recent vital signs:  Vitals:   01/31/18 0325 01/31/18 0832  BP: 124/62 (!) 141/74  Pulse: 96 80  Resp: 18 16  Temp: 98.6 F (37 C) 98.2 F (36.8 C)  SpO2: 97% 98%    Recent laboratory studies:  Lab Results  Component Value Date   HGB 13.5 01/28/2018   HGB 10.0 (L) 09/14/2016   HGB 10.8 (L) 09/13/2016   Lab Results  Component Value Date   WBC 10.4 01/28/2018   PLT 325 01/28/2018   Lab Results  Component Value Date   INR 0.94 01/08/2011   Lab Results  Component Value Date   NA 142 01/30/2018   K 3.8 01/30/2018   CL 107 01/30/2018   CO2 23 01/30/2018   BUN 16 01/30/2018   CREATININE 1.10 (H) 01/30/2018   GLUCOSE 95 01/30/2018    Discharge Medications:   Allergies as of 01/31/2018      Reactions   Sulfa Antibiotics    UNSPECIFIED REACTION of CHILDHOOD   Ciprofloxacin Other (See Comments)   Yeast infections.   Gabapentin Other (See Comments)   Changes in patient mood      Medication List    TAKE these medications   amLODipine 5 MG tablet Commonly known as:  NORVASC Take 5 mg  by mouth every evening.   atorvastatin 40 MG tablet Commonly known as:  LIPITOR Take 40 mg by mouth every evening.   BENICAR 20 MG tablet Generic drug:  olmesartan Take 20 mg by mouth every evening.   cholecalciferol 1000 units tablet Commonly known as:  VITAMIN D Take 1,000 Units by mouth every evening.   clonazePAM 1 MG tablet Commonly known as:  KLONOPIN Take 1 mg by mouth 2 (two) times daily as needed for anxiety.   HYDROcodone-acetaminophen 10-325 MG tablet Commonly known as:  NORCO Take 1 tablet by mouth 4 (four) times daily as needed (for pain.).   levothyroxine 50 MCG tablet Commonly known as:  SYNTHROID, LEVOTHROID Take 50 mcg by mouth daily before breakfast.   omeprazole 40 MG capsule Commonly known as:  PRILOSEC Take 40 mg by mouth daily before supper.   ondansetron 4 MG disintegrating tablet Commonly known as:   ZOFRAN ODT Take 1 tablet (4 mg total) by mouth every 8 (eight) hours as needed for nausea or vomiting.   oxyCODONE-acetaminophen 10-325 MG tablet Commonly known as:  PERCOCET 1/2 to 1 tablet q4 hours   oxymetazoline 0.05 % nasal spray Commonly known as:  AFRIN Place 1 spray into both nostrils 2 (two) times daily as needed for congestion.   raloxifene 60 MG tablet Commonly known as:  EVISTA Take 60 mg by mouth every evening.   vitamin E 400 UNIT capsule Take 400 Units by mouth every evening.   zolpidem 10 MG tablet Commonly known as:  AMBIEN Take 10 mg by mouth at bedtime.       Diagnostic Studies: Dg Thoracic Spine 2 View  Result Date: 01/30/2018 CLINICAL DATA:  Lumbar spinal cord stimulator insertion. EXAM: THORACIC SPINE 2 VIEWS; DG C-ARM 61-120 MIN FLUOROSCOPY TIME:  45.4 seconds. COMPARISON:  None. FINDINGS: Three intraoperative fluoroscopic images were obtained of the lower thoracic spine. These demonstrate placement of stimulator lead into the lower thoracic spinal canal. IMPRESSION: Placement of stimulator lead into lower thoracic spinal canal. Electronically Signed   By: Marijo Conception, M.D.   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 Imaging was continued during slow intravenous injection of Lasix approximately 15 minutes after the start of the examination. RADIOPHARMACEUTICALS:  4.9 mCi Technetium-64mMAG3 IV Pharmaceutical: Lasix 44 mg IV at mid point of renal scintigraphy. COMPARISON:  08/06/2017 Correlation: CT abdomen and pelvis 12/03/2017 FINDINGS: Flow: Prompt symmetric arterial flow to the LEFT kidney. Delayed and diminished blood flow to RIGHT  kidney. Left renogram: Normal uptake, concentration, and excretion of tracer by LEFT kidney. Minimal dilatation of LEFT renal collecting system, which may account for the delayed time to peak activity of 9.2 minutes. Normal washout of tracer from LEFT kidney which accelerates following diuretic administration. Fall to half maximum activity 14 minutes post maximum. No significant residual tracer at the conclusion of the exam. Right renogram: Diminished uptake of tracer by RIGHT kidney. Excretion of tracer into a significantly dilated RIGHT renal collecting system. Continual climbing RIGHT renogram curve until Lasix administration at which time Day washout of tracer from the RIGHT kidney occurs. Delayed time to maximum activity of 21.7 minutes with fall to half maximum 5.4 minutes later. Differential: Left kidney = 77 % Right  kidney = 23 % T1/2 post Lasix : Left kidney = 4.3 min Right kidney = 5.4 min IMPRESSION: Minimally dilated LEFT renal collecting system without evidence of urinary outflow obstruction. Dilated RIGHT renal collecting system with adequate washout of tracer following Lasix administration indicating a lack of significant urinary outflow obstruction. Markedly asymmetric renal function LEFT kidney 77% versus RIGHT kidney 23%. Electronically Signed   By: Lavonia Dana M.D.   On: 01/27/2018 13:48   Dg C-arm 1-60 Min  Result Date: 01/30/2018 CLINICAL DATA:  Lumbar spinal cord stimulator insertion. EXAM: THORACIC SPINE 2 VIEWS; DG C-ARM 61-120 MIN FLUOROSCOPY TIME:  45.4 seconds. COMPARISON:  None. FINDINGS: Three intraoperative fluoroscopic images were obtained of the lower thoracic spine. These demonstrate placement of stimulator lead into the lower thoracic spinal canal. IMPRESSION: Placement of stimulator lead into lower thoracic spinal canal. Electronically Signed   By: Marijo Conception, M.D.   On: 01/30/2018 10:07   Dg C-arm 1-60 Min  Result Date: 01/30/2018 CLINICAL DATA:  Lumbar spinal cord  stimulator insertion. EXAM: THORACIC SPINE 2 VIEWS; DG C-ARM 61-120 MIN FLUOROSCOPY TIME:  45.4 seconds. COMPARISON:  None. FINDINGS: Three intraoperative fluoroscopic images were obtained of the lower thoracic spine. These demonstrate placement of stimulator lead into the lower thoracic spinal canal. IMPRESSION: Placement of stimulator lead into lower thoracic spinal canal. Electronically Signed   By: Marijo Conception, M.D.   On: 01/30/2018 10:07    Disposition: 01-Home or Self Care Pt will report to clinic in 2 weeks Post op meds provided Discharge Instructions    Incentive spirometry RT   Complete by:  As directed       Follow-up Information    Melina Schools, MD Follow up in 2 week(s).   Specialty:  Orthopedic Surgery Contact information: [address] [place] [address] [phone]            Signed: Valinda Hoar 02/03/2018, 10:33 AM

## 2018-02-04 ENCOUNTER — Encounter (HOSPITAL_COMMUNITY): Payer: Self-pay | Admitting: Orthopedic Surgery

## 2018-02-10 ENCOUNTER — Encounter (HOSPITAL_COMMUNITY): Payer: Self-pay | Admitting: Orthopedic Surgery

## 2018-02-12 DIAGNOSIS — N1339 Other hydronephrosis: Secondary | ICD-10-CM | POA: Diagnosis not present

## 2018-03-03 DIAGNOSIS — N3000 Acute cystitis without hematuria: Secondary | ICD-10-CM | POA: Diagnosis not present

## 2018-03-03 DIAGNOSIS — N1339 Other hydronephrosis: Secondary | ICD-10-CM | POA: Diagnosis not present

## 2018-03-07 DIAGNOSIS — H2513 Age-related nuclear cataract, bilateral: Secondary | ICD-10-CM | POA: Diagnosis not present

## 2018-03-12 DIAGNOSIS — M5136 Other intervertebral disc degeneration, lumbar region: Secondary | ICD-10-CM | POA: Diagnosis not present

## 2018-03-12 DIAGNOSIS — Z79899 Other long term (current) drug therapy: Secondary | ICD-10-CM | POA: Diagnosis not present

## 2018-03-18 DIAGNOSIS — R350 Frequency of micturition: Secondary | ICD-10-CM | POA: Diagnosis not present

## 2018-03-18 DIAGNOSIS — R300 Dysuria: Secondary | ICD-10-CM | POA: Diagnosis not present

## 2018-03-18 DIAGNOSIS — R351 Nocturia: Secondary | ICD-10-CM | POA: Diagnosis not present

## 2018-03-18 DIAGNOSIS — N1339 Other hydronephrosis: Secondary | ICD-10-CM | POA: Diagnosis not present

## 2018-03-18 DIAGNOSIS — R3915 Urgency of urination: Secondary | ICD-10-CM | POA: Diagnosis not present

## 2018-04-17 DIAGNOSIS — I129 Hypertensive chronic kidney disease with stage 1 through stage 4 chronic kidney disease, or unspecified chronic kidney disease: Secondary | ICD-10-CM | POA: Diagnosis not present

## 2018-04-17 DIAGNOSIS — E669 Obesity, unspecified: Secondary | ICD-10-CM | POA: Diagnosis not present

## 2018-04-17 DIAGNOSIS — Z0000 Encounter for general adult medical examination without abnormal findings: Secondary | ICD-10-CM | POA: Diagnosis not present

## 2018-04-17 DIAGNOSIS — F322 Major depressive disorder, single episode, severe without psychotic features: Secondary | ICD-10-CM | POA: Diagnosis not present

## 2018-04-17 DIAGNOSIS — E039 Hypothyroidism, unspecified: Secondary | ICD-10-CM | POA: Diagnosis not present

## 2018-04-17 DIAGNOSIS — E782 Mixed hyperlipidemia: Secondary | ICD-10-CM | POA: Diagnosis not present

## 2018-04-17 DIAGNOSIS — G4733 Obstructive sleep apnea (adult) (pediatric): Secondary | ICD-10-CM | POA: Diagnosis not present

## 2018-04-17 DIAGNOSIS — R7301 Impaired fasting glucose: Secondary | ICD-10-CM | POA: Diagnosis not present

## 2018-04-17 DIAGNOSIS — N183 Chronic kidney disease, stage 3 (moderate): Secondary | ICD-10-CM | POA: Diagnosis not present

## 2018-04-18 ENCOUNTER — Other Ambulatory Visit: Payer: Self-pay | Admitting: Family Medicine

## 2018-04-18 DIAGNOSIS — Z1231 Encounter for screening mammogram for malignant neoplasm of breast: Secondary | ICD-10-CM

## 2018-05-09 ENCOUNTER — Ambulatory Visit: Payer: Medicare HMO

## 2018-05-16 ENCOUNTER — Ambulatory Visit
Admission: RE | Admit: 2018-05-16 | Discharge: 2018-05-16 | Disposition: A | Discharge status: Home / Self Care | Payer: Medicare HMO | Source: Ambulatory Visit | Attending: Family Medicine | Admitting: Family Medicine

## 2018-05-16 DIAGNOSIS — Z1231 Encounter for screening mammogram for malignant neoplasm of breast: Secondary | ICD-10-CM

## 2018-05-20 DIAGNOSIS — E039 Hypothyroidism, unspecified: Secondary | ICD-10-CM | POA: Diagnosis not present

## 2018-05-20 DIAGNOSIS — F322 Major depressive disorder, single episode, severe without psychotic features: Secondary | ICD-10-CM | POA: Diagnosis not present

## 2018-06-10 DIAGNOSIS — T84418A Breakdown (mechanical) of other internal orthopedic devices, implants and grafts, initial encounter: Secondary | ICD-10-CM | POA: Diagnosis not present

## 2018-06-24 DIAGNOSIS — M24112 Other articular cartilage disorders, left shoulder: Secondary | ICD-10-CM | POA: Diagnosis not present

## 2018-06-24 DIAGNOSIS — M94212 Chondromalacia, left shoulder: Secondary | ICD-10-CM | POA: Diagnosis not present

## 2018-06-24 DIAGNOSIS — M7542 Impingement syndrome of left shoulder: Secondary | ICD-10-CM | POA: Diagnosis not present

## 2018-06-24 DIAGNOSIS — M65812 Other synovitis and tenosynovitis, left shoulder: Secondary | ICD-10-CM | POA: Diagnosis not present

## 2018-06-24 DIAGNOSIS — S43432A Superior glenoid labrum lesion of left shoulder, initial encounter: Secondary | ICD-10-CM | POA: Diagnosis not present

## 2018-06-24 DIAGNOSIS — M659 Synovitis and tenosynovitis, unspecified: Secondary | ICD-10-CM | POA: Diagnosis not present

## 2018-06-24 DIAGNOSIS — M75112 Incomplete rotator cuff tear or rupture of left shoulder, not specified as traumatic: Secondary | ICD-10-CM | POA: Diagnosis not present

## 2018-06-24 DIAGNOSIS — G8918 Other acute postprocedural pain: Secondary | ICD-10-CM | POA: Diagnosis not present

## 2018-06-24 DIAGNOSIS — M19012 Primary osteoarthritis, left shoulder: Secondary | ICD-10-CM | POA: Diagnosis not present

## 2018-07-02 DIAGNOSIS — M25612 Stiffness of left shoulder, not elsewhere classified: Secondary | ICD-10-CM | POA: Diagnosis not present

## 2018-07-09 DIAGNOSIS — M545 Low back pain: Secondary | ICD-10-CM | POA: Diagnosis not present

## 2018-07-09 DIAGNOSIS — M5136 Other intervertebral disc degeneration, lumbar region: Secondary | ICD-10-CM | POA: Diagnosis not present

## 2018-07-09 DIAGNOSIS — Z79899 Other long term (current) drug therapy: Secondary | ICD-10-CM | POA: Diagnosis not present

## 2018-07-10 DIAGNOSIS — F322 Major depressive disorder, single episode, severe without psychotic features: Secondary | ICD-10-CM | POA: Diagnosis not present

## 2018-07-15 DIAGNOSIS — M5136 Other intervertebral disc degeneration, lumbar region: Secondary | ICD-10-CM | POA: Diagnosis not present

## 2018-07-29 DIAGNOSIS — Z01810 Encounter for preprocedural cardiovascular examination: Secondary | ICD-10-CM | POA: Diagnosis not present

## 2018-07-29 DIAGNOSIS — E7849 Other hyperlipidemia: Secondary | ICD-10-CM | POA: Diagnosis not present

## 2018-07-29 DIAGNOSIS — E669 Obesity, unspecified: Secondary | ICD-10-CM | POA: Diagnosis not present

## 2018-07-29 DIAGNOSIS — R000 Tachycardia, unspecified: Secondary | ICD-10-CM | POA: Diagnosis not present

## 2018-07-29 DIAGNOSIS — R0789 Other chest pain: Secondary | ICD-10-CM | POA: Diagnosis not present

## 2018-07-29 DIAGNOSIS — E785 Hyperlipidemia, unspecified: Secondary | ICD-10-CM | POA: Diagnosis not present

## 2018-07-29 DIAGNOSIS — N183 Chronic kidney disease, stage 3 (moderate): Secondary | ICD-10-CM | POA: Diagnosis not present

## 2018-07-29 DIAGNOSIS — R079 Chest pain, unspecified: Secondary | ICD-10-CM | POA: Diagnosis not present

## 2018-07-29 DIAGNOSIS — I119 Hypertensive heart disease without heart failure: Secondary | ICD-10-CM | POA: Diagnosis not present

## 2018-08-11 NOTE — Pre-Procedure Instructions (Signed)
KHOLE ARTERBURN  08/11/2018      [facility] - [place] - [address] [place] [address] Phone: [phone] Fax: [phone]    Your procedure is scheduled on September 4  Report to [facility] at [place].M.  Call this number if you have problems the morning of surgery:  [phone]   Remember:  Do not eat or drink after midnight.      Take these medicines the morning of surgery with A SIP OF WATER  clonazePAM (KLONOPIN)  HYDROcodone-acetaminophen (NORCO)  levothyroxine (SYNTHROID, LEVOTHROID)  vortioxetine HBr (TRINTELLIX)  7 days prior to surgery STOP taking any Aspirin(unless otherwise instructed by your surgeon), Aleve, Naproxen, Ibuprofen, Motrin, Advil, Goody's, BC's, all herbal medications, fish oil, and all vitamins     Do not wear jewelry, make-up or nail polish.  Do not wear lotions, powders, or perfumes, or deodorant.  Do not shave 48 hours prior to surgery.   Do not bring valuables to the hospital.  [facility] is not responsible for any belongings or valuables.  Contacts, dentures or bridgework may not be worn into surgery.  Leave your suitcase in the car.  After surgery it may be brought to your room.  For patients admitted to the hospital, discharge time will be determined by your treatment team.  Patients discharged the day of surgery will not be allowed to drive home.    Special instructions:   [facility]- Preparing For Surgery  Before surgery, you can play an important role. Because skin is not sterile, your skin needs to be as free of germs as possible. You can reduce the number of germs on your skin by washing with CHG (chlorahexidine gluconate) Soap before surgery.  CHG is an antiseptic cleaner which kills germs and bonds with the skin to continue killing germs even after washing.    Oral Hygiene is also important to reduce your risk of infection.   Remember - BRUSH YOUR TEETH THE MORNING OF SURGERY WITH YOUR REGULAR TOOTHPASTE  Please do not use if you have an allergy to CHG or antibacterial soaps. If your skin becomes reddened/irritated stop using the CHG.  Do not shave (including legs and underarms) for at least 48 hours prior to first CHG shower. It is OK to shave your face.  Please follow these instructions carefully.   1. Shower the NIGHT BEFORE SURGERY and the MORNING OF SURGERY with CHG.   2. If you chose to wash your hair, wash your hair first as usual with your normal shampoo.  3. After you shampoo, rinse your hair and body thoroughly to remove the shampoo.  4. Use CHG as you would any other liquid soap. You can apply CHG directly to the skin and wash gently with a scrungie or a clean washcloth.   5. Apply the CHG Soap to your body ONLY FROM THE NECK DOWN.  Do not use on open wounds or open sores. Avoid contact with your eyes, ears, mouth and genitals (private parts). Wash Face and genitals (private parts)  with your normal soap.  6. Wash thoroughly, paying special attention to the area where your surgery will be performed.  7. Thoroughly rinse your body with warm water from the neck down.  8. DO NOT shower/wash with your normal soap after using and rinsing off the CHG Soap.  9. Pat yourself dry with a CLEAN TOWEL.  10. Wear CLEAN PAJAMAS  to bed the night before surgery, wear comfortable clothes the morning of surgery  11. Place CLEAN SHEETS on your bed the night of your first shower and DO NOT SLEEP WITH PETS.    Day of Surgery:  Do not apply any deodorants/lotions.  Please wear clean clothes to the hospital/surgery center.   Remember to brush your teeth WITH YOUR REGULAR TOOTHPASTE.    Please read over the following fact sheets that you were given.

## 2018-08-12 ENCOUNTER — Other Ambulatory Visit: Payer: Self-pay

## 2018-08-12 ENCOUNTER — Encounter (HOSPITAL_COMMUNITY): Payer: Self-pay

## 2018-08-12 ENCOUNTER — Encounter (HOSPITAL_COMMUNITY)
Admission: RE | Admit: 2018-08-12 | Discharge: 2018-08-12 | Disposition: A | Discharge status: Home / Self Care | Payer: Medicare HMO | Source: Ambulatory Visit | Attending: Orthopedic Surgery | Admitting: Orthopedic Surgery

## 2018-08-12 DIAGNOSIS — R0600 Dyspnea, unspecified: Secondary | ICD-10-CM | POA: Diagnosis not present

## 2018-08-12 DIAGNOSIS — G4733 Obstructive sleep apnea (adult) (pediatric): Secondary | ICD-10-CM | POA: Insufficient documentation

## 2018-08-12 DIAGNOSIS — F419 Anxiety disorder, unspecified: Secondary | ICD-10-CM | POA: Insufficient documentation

## 2018-08-12 DIAGNOSIS — Z79899 Other long term (current) drug therapy: Secondary | ICD-10-CM | POA: Insufficient documentation

## 2018-08-12 DIAGNOSIS — N189 Chronic kidney disease, unspecified: Secondary | ICD-10-CM | POA: Diagnosis not present

## 2018-08-12 DIAGNOSIS — Z01812 Encounter for preprocedural laboratory examination: Secondary | ICD-10-CM | POA: Diagnosis not present

## 2018-08-12 DIAGNOSIS — F329 Major depressive disorder, single episode, unspecified: Secondary | ICD-10-CM | POA: Insufficient documentation

## 2018-08-12 DIAGNOSIS — I129 Hypertensive chronic kidney disease with stage 1 through stage 4 chronic kidney disease, or unspecified chronic kidney disease: Secondary | ICD-10-CM | POA: Diagnosis not present

## 2018-08-12 DIAGNOSIS — Z79890 Hormone replacement therapy (postmenopausal): Secondary | ICD-10-CM | POA: Diagnosis not present

## 2018-08-12 DIAGNOSIS — K219 Gastro-esophageal reflux disease without esophagitis: Secondary | ICD-10-CM | POA: Diagnosis not present

## 2018-08-12 LAB — CBC
HEMATOCRIT: 39.9 % (ref 36.0–46.0)
Hemoglobin: 11.9 g/dL — ABNORMAL LOW (ref 12.0–15.0)
MCH: 25.9 pg — AB (ref 26.0–34.0)
MCHC: 29.8 g/dL — AB (ref 30.0–36.0)
MCV: 86.7 fL (ref 78.0–100.0)
PLATELETS: 366 10*3/uL (ref 150–400)
RBC: 4.6 MIL/uL (ref 3.87–5.11)
RDW: 14 % (ref 11.5–15.5)
WBC: 7.4 10*3/uL (ref 4.0–10.5)

## 2018-08-12 LAB — BASIC METABOLIC PANEL
Anion gap: 11 (ref 5–15)
BUN: 14 mg/dL (ref 8–23)
CHLORIDE: 109 mmol/L (ref 98–111)
CO2: 21 mmol/L — ABNORMAL LOW (ref 22–32)
CREATININE: 1.17 mg/dL — AB (ref 0.44–1.00)
Calcium: 8.9 mg/dL (ref 8.9–10.3)
GFR calc Af Amer: 53 mL/min — ABNORMAL LOW (ref >60)
GFR calc non Af Amer: 46 mL/min — ABNORMAL LOW (ref >60)
Glucose, Bld: 139 mg/dL — ABNORMAL HIGH (ref 70–99)
POTASSIUM: 4.2 mmol/L (ref 3.5–5.1)
SODIUM: 141 mmol/L (ref 135–145)

## 2018-08-12 LAB — SURGICAL PCR SCREEN
MRSA, PCR: NEGATIVE
Staphylococcus aureus: NEGATIVE

## 2018-08-12 NOTE — Progress Notes (Signed)
PCP - [name] Cardiologist - Tollie Eth   Chest x-ray - not needed EKG - 12/20/17 - in media tab - printed out and placed in chart Stress Test - 01/09/18 - media tab ECHO - 12/27/17 - in media tab Cardiac Cath - denies  Sleep Study - > 15 years ago CPAP - does not have   Anesthesia review: yes, angela note on 01/28/18   Patient denies shortness of breath, fever, cough and chest pain at PAT appointment   Patient verbalized understanding of instructions that were given to them at the PAT appointment. Patient was also instructed that they will need to review over the PAT instructions again at home before surgery.

## 2018-08-12 NOTE — H&P (Addendum)
Patient ID: [name] MRN: [id number] DOB/AGE: [birth year] 72 y.o.  Admit date: (Not on file)  Admission Diagnoses:  SCS battery dysfunction  HPI: Patient is here today for an H&P to be done with [facility]. Patient states her pain level today is an 8 that consist of "squeezing" pains. She states she is taking Hydrocodone for the pain 4 times daily. Patient is scheduled for surgery on 08/20/18.  Past Medical History: Past Medical History:  Diagnosis Date  . Anxiety   . Arthritis    cerv & lumbar arthritis  . Cervical dystonia    In remission  . Chronic kidney disease    R cyst, obstruction- Dr. Lorrene Reid seen pt. 12/2017  . Colitis   . Depression    [name] - sees counsellor, no meds.  Marland Kitchen Dyspnea   . GERD (gastroesophageal reflux disease)   . Hypertension   . Sleep apnea    told that she has apnea - on sleep study, cannot tolerate     Surgical History: Past Surgical History:  Procedure Laterality Date  . ABDOMINAL HYSTERECTOMY    . APPENDECTOMY    . CHOLECYSTECTOMY    . JOINT REPLACEMENT  2012   R knee   . KNEE SURGERY    . ROTATOR CUFF REPAIR Right 2016  . SPINAL CORD STIMULATOR INSERTION N/A 01/30/2018   Procedure: LUMBAR SPINAL CORD STIMULATOR INSERTION;  Surgeon: Melina Schools, MD;  Location: [facility];  Service: Orthopedics;  Laterality: N/A;  2.5 hrs  . TONSILLECTOMY      Family History: Family History  Problem Relation Age of Onset  . Alzheimer's disease Mother   . Heart disease Father     Social History: Social History   Socioeconomic History  . Marital status: Married    Spouse name: Ronalee Belts  . Number of children: 3  . Years of education: college  . Highest education level: Not on file  Occupational History    Employer: RETIRED    Comment: retired  Scientific laboratory technician  . Financial resource strain: Not on file  . Food insecurity:    Worry: Not on file    Inability: Not on file  . Transportation needs:    Medical: Not on file   Non-medical: Not on file  Tobacco Use  . Smoking status: Former Smoker    Types: Cigarettes    Last attempt to quit: 1991    Years since quitting: 28.6  . Smokeless tobacco: Never Used  Substance and Sexual Activity  . Alcohol use: No  . Drug use: No  . Sexual activity: Not on file  Lifestyle  . Physical activity:    Days per week: Not on file    Minutes per session: Not on file  . Stress: Not on file  Relationships  . Social connections:    Talks on phone: Not on file    Gets together: Not on file    Attends religious service: Not on file    Active member of club or organization: Not on file    Attends meetings of clubs or organizations: Not on file    Relationship status: Not on file  . Intimate partner violence:    Fear of current or ex partner: Not on file    Emotionally abused: Not on file    Physically abused: Not on file    Forced sexual activity: Not on file  Other Topics Concern  . Not on file  Social History Narrative   Patient  lives at home with her husband Ronalee Belts)    Retired    Right handed   Some college   Caffeine three cups daily    Allergies: Botox [onabotulinumtoxina]; Sulfa antibiotics; Ciprofloxacin; and Gabapentin  Medications: I have reviewed the patient's current medications.  Vital Signs: No data found.  Radiology: No results found.  Labs: Recent Labs    08/12/18 1027  WBC 7.4  RBC 4.60  HCT 39.9  PLT 366   Recent Labs    08/12/18 1027  NA 141  K 4.2  CL 109  CO2 21*  BUN 14  CREATININE 1.17*  GLUCOSE 139*  CALCIUM 8.9   No results for input(s): LABPT, INR in the last 72 hours.  Review of Systems: ROS  Physical Exam: There is no height or weight on file to calculate BMI.  Physical Exam  Constitutional: She is oriented to person, place, and time. She appears well-developed and well-nourished.  HENT:  Head: Normocephalic.  Cardiovascular: Normal rate and regular rhythm.  Respiratory: Effort normal and breath  sounds normal.  GI: Soft. Bowel sounds are normal.  Neurological: She is alert and oriented to person, place, and time.  Skin: Skin is warm and dry.  Psychiatric: She has a normal mood and affect. Her behavior is normal. Judgment and thought content normal.   Tenderness to palpation of the battery site above incision line tenderness to palpation of lumbar paraspinals range of motion elicits further pain  Assessment and Plan: Risks and benefits of surgery were discussed with the patient. These include: Infection, bleeding, death, stroke, paralysis, ongoing or worse pain, need for additional surgery, leak of spinal fluid, Failure of the battery requiring reoperation. Inability to place the paddle requiring the surgery to be aborted. Migration of the lead, failure to obtain results similar to the trial.  Goal of surgery: Reproduce pain relief obtained during the trial. Improved quality of life.  Ronette Deter, PAC for Melina Schools, MD [facility] [phone]  Patient's clinical exam is unchanged from her last office visit of 08/12/2018.  She continues to have significant pain which she describes as an electrical shock at the battery site 5-6 times a day.  After interrogation there does appear to be some abnormality with the nonrechargeable battery that could be leading to the localized pain.  As a result we have elected to move forward with removing this battery and replacing with with the rechargeable battery.  I have gone over the procedure with the patient and her husband and all the risks benefits and alternatives were again discussed.  All of their questions were encouraged and addressed.  Plan will be to place the battery and secured in same place currently is in.  The patient will be able to be discharged home later today.  She will be given a short course of hydrocodone, and Zofran in case of nausea.  She will follow-up with me in 2 weeks for reevaluation of the wound.

## 2018-08-13 NOTE — Progress Notes (Signed)
Anesthesia Chart Review:  Case:  [id number] Date/Time:  08/20/18 0815   Procedure:  SPINAL CORD STIMULATOR BATTERY EXCHANGE/REPLACE WITH RECHARGEABLE BATTERY (N/A )   Anesthesia type:  General   Pre-op diagnosis:  Spinal cord stimulator battery dysfunction   Location:  [facility] OR ROOM 04 / [facility]   Surgeon:  Melina Schools, MD      DISCUSSION: 72 yo female former smoker for above procedure. Pertinent hx includes HTN, CKD, Anxiety, GERD, OSA no on CPAP, Dyspnea, Depression. S/p spinal cord stimulator insertion 4/40/34 without complication.  Pt was cleared by cardiologist Dr. Tollie Eth on 01/20/2018 prior to her surgery 01/30/2018 (clearance note under Media tab Correspondence dated 02/01/2018). January 2019 she had a Low Risk nuclear stress test and an Echo with EF 55%.  Anticipate she can proceed with procedure as scheduled barring acute status change.  VS: BP (!) 168/84   Pulse 86   Temp 36.5 C (Oral)   Resp 18   Ht 5\' 3"  (1.6 m)   Wt 88.4 kg   SpO2 96%   BMI 34.52 kg/m   PROVIDERS: [name], MD is PCP  Jamal Maes MD is Nephrologist  Tollie Eth, MD is Cardiologist.   LABS: Labs reviewed: Acceptable for surgery. Per nephrology notes baseline Cr ~1.2-1.4. (all labs ordered are listed, but only abnormal results are displayed)  Labs Reviewed  BASIC METABOLIC PANEL - Abnormal; Notable for the following components:      Result Value   CO2 21 (*)    Glucose, Bld 139 (*)    Creatinine, Ser 1.17 (*)    GFR calc non Af Amer 46 (*)    GFR calc Af Amer 53 (*)    All other components within normal limits  CBC - Abnormal; Notable for the following components:   Hemoglobin 11.9 (*)    MCH 25.9 (*)    MCHC 29.8 (*)    All other components within normal limits  SURGICAL PCR SCREEN     IMAGES: NM renal imaging 01/27/18:  - Minimally dilated LEFT renal collecting system without evidence of urinary outflow obstruction. - Dilated RIGHT renal collecting system with  adequate washout of tracer following Lasix administration indicating a lack of significant urinary outflow obstruction. - Markedly asymmetric renal function LEFT kidney 77% versus RIGHT kidney 23%.  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 No renal or ureteral calculi are noted. Bilateral renal cortical scarring is again noted. - No other significant abnormality seen in the abdomen or pelvis.  EKG: 12/20/17 (Dr. Thurman Coyer office): sinus rhythm. Reverse R wave progression V2, V3.   CV: Nuclear stress test 01/09/18 (Dr. Thurman Coyer office): 1.  Normal Lexiscan Myoview scan with no evidence of ischemia or infarction. 2.  Normal quantitative gated SPECT EF 63% with normal wall motion and normal thickening.   - Low risk scan. Good myocardial perfusion and LV function.  May proceed with surgery.  Echo 12/27/17 (Dr. Thurman Coyer office): 1.  Moderate concentric LVH with normal global wall motion.  Grade I diastolic dysfunction.  Estimated EF 55%. 2.  Trace tricuspid and pulmonic regurgitation.   Past Medical History:  Diagnosis Date  . Anxiety   . Arthritis    cerv & lumbar arthritis  . Cervical dystonia    In remission  . Chronic kidney disease    R cyst, obstruction- Dr. Lorrene Reid seen pt. 12/2017  . Colitis   . Depression    [name] -  sees counsellor, no meds.  Marland Kitchen Dyspnea   . GERD (gastroesophageal reflux disease)   . Hypertension   . Sleep apnea    told that she has apnea - on sleep study, cannot tolerate     Past Surgical History:  Procedure Laterality Date  . ABDOMINAL HYSTERECTOMY    . APPENDECTOMY    . CHOLECYSTECTOMY    . JOINT REPLACEMENT  2012   R knee   . KNEE SURGERY    . ROTATOR CUFF REPAIR Right 2016  . SPINAL CORD STIMULATOR INSERTION N/A 01/30/2018   Procedure: LUMBAR SPINAL CORD STIMULATOR INSERTION;  Surgeon: Melina Schools, MD;  Location: [facility];  Service: Orthopedics;  Laterality: N/A;   2.5 hrs  . TONSILLECTOMY      MEDICATIONS: . amLODipine (NORVASC) 5 MG tablet  . atorvastatin (LIPITOR) 40 MG tablet  . cholecalciferol (VITAMIN D) 1000 units tablet  . clonazePAM (KLONOPIN) 1 MG tablet  . HYDROcodone-acetaminophen (NORCO) 10-325 MG tablet  . levothyroxine (SYNTHROID, LEVOTHROID) 75 MCG tablet  . OLANZapine (ZYPREXA) 5 MG tablet  . olmesartan (BENICAR) 20 MG tablet  . omeprazole (PRILOSEC) 40 MG capsule  . raloxifene (EVISTA) 60 MG tablet  . vitamin E 400 UNIT capsule  . vortioxetine HBr (TRINTELLIX) 10 MG TABS tablet  . zolpidem (AMBIEN) 10 MG tablet   No current facility-administered medications for this encounter.     Wynonia Musty [facility] Short Stay Center/Anesthesiology Phone [phone] 08/13/2018 11:38 AM

## 2018-08-14 DIAGNOSIS — F3176 Bipolar disorder, in full remission, most recent episode depressed: Secondary | ICD-10-CM | POA: Diagnosis not present

## 2018-08-14 DIAGNOSIS — F411 Generalized anxiety disorder: Secondary | ICD-10-CM | POA: Diagnosis not present

## 2018-08-20 ENCOUNTER — Ambulatory Visit (HOSPITAL_COMMUNITY): Payer: Medicare HMO | Admitting: Anesthesiology

## 2018-08-20 ENCOUNTER — Encounter (HOSPITAL_COMMUNITY): Payer: Self-pay

## 2018-08-20 ENCOUNTER — Encounter (HOSPITAL_COMMUNITY)
Admission: RE | Disposition: A | Discharge status: Home / Self Care | Payer: Self-pay | Source: Ambulatory Visit | Attending: Orthopedic Surgery

## 2018-08-20 ENCOUNTER — Ambulatory Visit (HOSPITAL_COMMUNITY): Payer: Medicare HMO | Admitting: Physician Assistant

## 2018-08-20 ENCOUNTER — Ambulatory Visit (HOSPITAL_COMMUNITY)
Admission: RE | Admit: 2018-08-20 | Discharge: 2018-08-20 | Disposition: A | Discharge status: Home / Self Care | Payer: Medicare HMO | Source: Ambulatory Visit | Attending: Orthopedic Surgery | Admitting: Orthopedic Surgery

## 2018-08-20 ENCOUNTER — Other Ambulatory Visit: Payer: Self-pay

## 2018-08-20 DIAGNOSIS — Z87891 Personal history of nicotine dependence: Secondary | ICD-10-CM | POA: Insufficient documentation

## 2018-08-20 DIAGNOSIS — Y828 Other medical devices associated with adverse incidents: Secondary | ICD-10-CM | POA: Insufficient documentation

## 2018-08-20 DIAGNOSIS — F329 Major depressive disorder, single episode, unspecified: Secondary | ICD-10-CM | POA: Diagnosis not present

## 2018-08-20 DIAGNOSIS — T85840A Pain due to nervous system prosthetic devices, implants and grafts, initial encounter: Secondary | ICD-10-CM | POA: Diagnosis not present

## 2018-08-20 DIAGNOSIS — I10 Essential (primary) hypertension: Secondary | ICD-10-CM | POA: Diagnosis not present

## 2018-08-20 DIAGNOSIS — F419 Anxiety disorder, unspecified: Secondary | ICD-10-CM | POA: Diagnosis not present

## 2018-08-20 DIAGNOSIS — Z79899 Other long term (current) drug therapy: Secondary | ICD-10-CM | POA: Diagnosis not present

## 2018-08-20 DIAGNOSIS — K219 Gastro-esophageal reflux disease without esophagitis: Secondary | ICD-10-CM | POA: Insufficient documentation

## 2018-08-20 DIAGNOSIS — N183 Chronic kidney disease, stage 3 (moderate): Secondary | ICD-10-CM | POA: Diagnosis not present

## 2018-08-20 DIAGNOSIS — Z462 Encounter for fitting and adjustment of other devices related to nervous system and special senses: Secondary | ICD-10-CM | POA: Diagnosis not present

## 2018-08-20 DIAGNOSIS — M1990 Unspecified osteoarthritis, unspecified site: Secondary | ICD-10-CM | POA: Diagnosis not present

## 2018-08-20 DIAGNOSIS — G4730 Sleep apnea, unspecified: Secondary | ICD-10-CM | POA: Diagnosis not present

## 2018-08-20 DIAGNOSIS — I129 Hypertensive chronic kidney disease with stage 1 through stage 4 chronic kidney disease, or unspecified chronic kidney disease: Secondary | ICD-10-CM | POA: Diagnosis not present

## 2018-08-20 DIAGNOSIS — T85193A Other mechanical complication of implanted electronic neurostimulator, generator, initial encounter: Secondary | ICD-10-CM | POA: Diagnosis not present

## 2018-08-20 HISTORY — PX: SPINAL CORD STIMULATOR BATTERY EXCHANGE: SHX6202

## 2018-08-20 SURGERY — SPINAL CORD STIMULATOR BATTERY EXCHANGE
Anesthesia: General

## 2018-08-20 MED ORDER — DEXAMETHASONE SODIUM PHOSPHATE 10 MG/ML IJ SOLN
INTRAMUSCULAR | Status: DC | PRN
  Administered 2018-08-20: 10 mg via INTRAVENOUS

## 2018-08-20 MED ORDER — MEPERIDINE HCL 50 MG/ML IJ SOLN: 6.2500 mg | INTRAMUSCULAR | Status: DC | PRN

## 2018-08-20 MED ORDER — METOCLOPRAMIDE HCL 5 MG/ML IJ SOLN: 10.0000 mg | Freq: Once | INTRAMUSCULAR | Status: DC | PRN

## 2018-08-20 MED ORDER — PHENYLEPHRINE 40 MCG/ML (10ML) SYRINGE FOR IV PUSH (FOR BLOOD PRESSURE SUPPORT)
PREFILLED_SYRINGE | INTRAVENOUS | Status: AC
  Filled 2018-08-20: qty 10

## 2018-08-20 MED ORDER — ONDANSETRON 4 MG PO TBDP: 4.0000 mg | ORAL_TABLET | Freq: Three times a day (TID) | ORAL | 0 refills | Status: DC | PRN

## 2018-08-20 MED ORDER — PROPOFOL 10 MG/ML IV BOLUS
INTRAVENOUS | Status: DC | PRN
  Administered 2018-08-20: 150 mg via INTRAVENOUS

## 2018-08-20 MED ORDER — PROPOFOL 10 MG/ML IV BOLUS
INTRAVENOUS | Status: AC
  Filled 2018-08-20: qty 20

## 2018-08-20 MED ORDER — ONDANSETRON HCL 4 MG/2ML IJ SOLN
INTRAMUSCULAR | Status: DC | PRN
  Administered 2018-08-20: 4 mg via INTRAVENOUS

## 2018-08-20 MED ORDER — LACTATED RINGERS IV SOLN
INTRAVENOUS | Status: DC | PRN
  Administered 2018-08-20: 08:00:00 via INTRAVENOUS

## 2018-08-20 MED ORDER — MIDAZOLAM HCL 5 MG/5ML IJ SOLN
INTRAMUSCULAR | Status: DC | PRN
  Administered 2018-08-20: 2 mg via INTRAVENOUS

## 2018-08-20 MED ORDER — 0.9 % SODIUM CHLORIDE (POUR BTL) OPTIME
TOPICAL | Status: DC | PRN
  Administered 2018-08-20: 1000 mL

## 2018-08-20 MED ORDER — FENTANYL CITRATE (PF) 100 MCG/2ML IJ SOLN: INTRAMUSCULAR | Status: DC | PRN

## 2018-08-20 MED ORDER — BUPIVACAINE-EPINEPHRINE 0.25% -1:200000 IJ SOLN
INTRAMUSCULAR | Status: DC | PRN
  Administered 2018-08-20: 20 mL

## 2018-08-20 MED ORDER — FENTANYL CITRATE (PF) 100 MCG/2ML IJ SOLN: 25.0000 ug | INTRAMUSCULAR | Status: DC | PRN

## 2018-08-20 MED ORDER — FENTANYL CITRATE (PF) 100 MCG/2ML IJ SOLN
INTRAMUSCULAR | Status: DC | PRN
  Administered 2018-08-20: 100 ug via INTRAVENOUS

## 2018-08-20 MED ORDER — SODIUM CHLORIDE 0.9 % IV SOLN
INTRAVENOUS | Status: DC | PRN
  Administered 2018-08-20: 09:00:00

## 2018-08-20 MED ORDER — SUCCINYLCHOLINE CHLORIDE 200 MG/10ML IV SOSY
PREFILLED_SYRINGE | INTRAVENOUS | Status: AC
  Filled 2018-08-20: qty 10

## 2018-08-20 MED ORDER — MIDAZOLAM HCL 2 MG/2ML IJ SOLN
INTRAMUSCULAR | Status: AC
  Filled 2018-08-20: qty 2

## 2018-08-20 MED ORDER — LIDOCAINE 2% (20 MG/ML) 5 ML SYRINGE
INTRAMUSCULAR | Status: DC | PRN
  Administered 2018-08-20: 100 mg via INTRAVENOUS

## 2018-08-20 MED ORDER — BUPIVACAINE-EPINEPHRINE (PF) 0.25% -1:200000 IJ SOLN
INTRAMUSCULAR | Status: AC
  Filled 2018-08-20: qty 30

## 2018-08-20 MED ORDER — PROPOFOL 10 MG/ML IV BOLUS: INTRAVENOUS | Status: DC | PRN

## 2018-08-20 MED ORDER — FENTANYL CITRATE (PF) 250 MCG/5ML IJ SOLN
INTRAMUSCULAR | Status: AC
  Filled 2018-08-20: qty 5

## 2018-08-20 MED ORDER — ACETAMINOPHEN 10 MG/ML IV SOLN
1000.0000 mg | INTRAVENOUS | Status: AC
  Administered 2018-08-20: 1000 mg via INTRAVENOUS
  Filled 2018-08-20: qty 100

## 2018-08-20 MED ORDER — ROCURONIUM BROMIDE 10 MG/ML (PF) SYRINGE: PREFILLED_SYRINGE | INTRAVENOUS | Status: DC | PRN

## 2018-08-20 MED ORDER — ROCURONIUM BROMIDE 10 MG/ML (PF) SYRINGE
PREFILLED_SYRINGE | INTRAVENOUS | Status: DC | PRN
  Administered 2018-08-20: 20 mg via INTRAVENOUS

## 2018-08-20 MED ORDER — LIDOCAINE 2% (20 MG/ML) 5 ML SYRINGE
INTRAMUSCULAR | Status: AC
  Filled 2018-08-20: qty 5

## 2018-08-20 MED ORDER — LIDOCAINE 2% (20 MG/ML) 5 ML SYRINGE: INTRAMUSCULAR | Status: DC | PRN

## 2018-08-20 MED ORDER — PHENYLEPHRINE 40 MCG/ML (10ML) SYRINGE FOR IV PUSH (FOR BLOOD PRESSURE SUPPORT)
PREFILLED_SYRINGE | INTRAVENOUS | Status: DC | PRN
  Administered 2018-08-20 (×3): 120 ug via INTRAVENOUS

## 2018-08-20 MED ORDER — SUCCINYLCHOLINE CHLORIDE 20 MG/ML IJ SOLN
INTRAMUSCULAR | Status: DC | PRN
  Administered 2018-08-20: 120 mg via INTRAVENOUS

## 2018-08-20 MED ORDER — CEFAZOLIN SODIUM-DEXTROSE 2-4 GM/100ML-% IV SOLN
2.0000 g | INTRAVENOUS | Status: AC
  Administered 2018-08-20: 2 g via INTRAVENOUS
  Filled 2018-08-20: qty 100

## 2018-08-20 MED ORDER — BUPIVACAINE LIPOSOME 1.3 % IJ SUSP
20.0000 mL | Freq: Once | INTRAMUSCULAR | Status: DC
  Filled 2018-08-20: qty 20

## 2018-08-20 MED ORDER — ONDANSETRON HCL 4 MG/2ML IJ SOLN
INTRAMUSCULAR | Status: AC
  Filled 2018-08-20: qty 2

## 2018-08-20 MED ORDER — ROCURONIUM BROMIDE 50 MG/5ML IV SOSY
PREFILLED_SYRINGE | INTRAVENOUS | Status: AC
  Filled 2018-08-20: qty 5

## 2018-08-20 MED ORDER — SUGAMMADEX SODIUM 200 MG/2ML IV SOLN
INTRAVENOUS | Status: DC | PRN
  Administered 2018-08-20: 180 mg via INTRAVENOUS

## 2018-08-20 MED ORDER — OXYCODONE-ACETAMINOPHEN 5-325 MG PO TABS: 1.0000 | ORAL_TABLET | Freq: Four times a day (QID) | ORAL | 0 refills | Status: AC | PRN

## 2018-08-20 MED ORDER — DEXAMETHASONE SODIUM PHOSPHATE 10 MG/ML IJ SOLN
INTRAMUSCULAR | Status: AC
  Filled 2018-08-20: qty 1

## 2018-08-20 SURGICAL SUPPLY — 53 items
CABLE MULTI-LEAD TRIAL SPINE (MISCELLANEOUS) ×2 IMPLANT
CANISTER SUCT 3000ML PPV (MISCELLANEOUS) ×2 IMPLANT
CLSR STERI-STRIP ANTIMIC 1/2X4 (GAUZE/BANDAGES/DRESSINGS) ×2 IMPLANT
CONNECTOR 5 IN 1 STRAIGHT STRL (MISCELLANEOUS) ×2 IMPLANT
CORDS BIPOLAR (ELECTRODE) ×2 IMPLANT
COVER PROBE W GEL 5X96 (DRAPES) ×2 IMPLANT
DRAPE INCISE IOBAN 66X45 STRL (DRAPES) ×2 IMPLANT
DRAPE LAPAROTOMY 100X72 PEDS (DRAPES) ×2 IMPLANT
DRAPE PED LAPAROTOMY (DRAPES) ×2 IMPLANT
DRAPE POUCH INSTRU U-SHP 10X18 (DRAPES) ×2 IMPLANT
DRAPE SURG 17X23 STRL (DRAPES) ×2 IMPLANT
DRAPE U-SHAPE 47X51 STRL (DRAPES) ×2 IMPLANT
DRSG AQUACEL AG ADV 3.5X 6 (GAUZE/BANDAGES/DRESSINGS) ×2 IMPLANT
DRSG OPSITE POSTOP 4X6 (GAUZE/BANDAGES/DRESSINGS) ×2 IMPLANT
DURAPREP 26ML APPLICATOR (WOUND CARE) ×2 IMPLANT
ELECT CAUTERY BLADE 6.4 (BLADE) ×2 IMPLANT
ELECT PENCIL ROCKER SW 15FT (MISCELLANEOUS) ×2 IMPLANT
ELECT REM PT RETURN 9FT ADLT (ELECTROSURGICAL) ×2
ELECTRODE REM PT RTRN 9FT ADLT (ELECTROSURGICAL) ×1 IMPLANT
GENERATOR IPG PRODIGY 4.8X5.3 (Generator) ×2 IMPLANT
GLOVE BIO SURGEON STRL SZ 6.5 (GLOVE) ×2 IMPLANT
GLOVE BIOGEL PI IND STRL 6.5 (GLOVE) ×1 IMPLANT
GLOVE BIOGEL PI IND STRL 8.5 (GLOVE) ×1 IMPLANT
GLOVE BIOGEL PI INDICATOR 6.5 (GLOVE) ×1
GLOVE BIOGEL PI INDICATOR 8.5 (GLOVE) ×1
GLOVE SS BIOGEL STRL SZ 8.5 (GLOVE) ×2 IMPLANT
GLOVE SUPERSENSE BIOGEL SZ 8.5 (GLOVE) ×2
GOWN STRL REUS W/ TWL LRG LVL3 (GOWN DISPOSABLE) ×2 IMPLANT
GOWN STRL REUS W/TWL 2XL LVL3 (GOWN DISPOSABLE) ×2 IMPLANT
GOWN STRL REUS W/TWL LRG LVL3 (GOWN DISPOSABLE) ×2
KIT BASIN OR (CUSTOM PROCEDURE TRAY) ×2 IMPLANT
KIT TURNOVER KIT B (KITS) ×2 IMPLANT
NDL SUT 6 .5 CRC .975X.05 MAYO (NEEDLE) ×1 IMPLANT
NEEDLE 22X1 1/2 (OR ONLY) (NEEDLE) IMPLANT
NEEDLE MAYO TAPER (NEEDLE) ×2
NS IRRIG 1000ML POUR BTL (IV SOLUTION) ×2 IMPLANT
PACK GENERAL/GYN (CUSTOM PROCEDURE TRAY) ×2 IMPLANT
PACK UNIVERSAL I (CUSTOM PROCEDURE TRAY) ×2 IMPLANT
PAD ARMBOARD 7.5X6 YLW CONV (MISCELLANEOUS) ×8 IMPLANT
SPONGE LAP 4X18 RFD (DISPOSABLE) ×2 IMPLANT
STAPLER VISISTAT 35W (STAPLE) ×2 IMPLANT
STRIP CLOSURE SKIN 1/2X4 (GAUZE/BANDAGES/DRESSINGS) ×2 IMPLANT
SUT ETHIBOND NAB CT1 #1 30IN (SUTURE) ×4 IMPLANT
SUT MON AB 3-0 SH 27 (SUTURE) ×2
SUT MON AB 3-0 SH27 (SUTURE) ×1 IMPLANT
SUT VIC AB 1 CT1 18XCR BRD 8 (SUTURE) ×1 IMPLANT
SUT VIC AB 1 CT1 8-18 (SUTURE) ×2
SUT VIC AB 2-0 CT1 18 (SUTURE) IMPLANT
SYR BULB IRRIGATION 50ML (SYRINGE) ×2 IMPLANT
SYR CONTROL 10ML LL (SYRINGE) IMPLANT
TOWEL OR 17X24 6PK STRL BLUE (TOWEL DISPOSABLE) ×2 IMPLANT
TOWEL OR 17X26 10 PK STRL BLUE (TOWEL DISPOSABLE) ×2 IMPLANT
WATER STERILE IRR 1000ML POUR (IV SOLUTION) IMPLANT

## 2018-08-20 NOTE — Discharge Instructions (Signed)
Incision Care, Adult °An incision is a cut that a doctor makes in your skin for surgery (for a procedure). Most times, these cuts are closed after surgery. Your cut from surgery may be closed with stitches (sutures), staples, skin glue, or skin tape (adhesive strips). You may need to return to your doctor to have stitches or staples taken out. This may happen many days or many weeks after your surgery. The cut needs to be well cared for so it does not get infected. °How to care for your cut °Cut care °· Follow instructions from your doctor about how to take care of your cut. Make sure you: °? Wash your hands with soap and water before you change your bandage (dressing). If you cannot use soap and water, use hand sanitizer. °? Change your bandage as told by your doctor. °? Leave stitches, skin glue, or skin tape in place. They may need to stay in place for 2 weeks or longer. If tape strips get loose and curl up, you may trim the loose edges. Do not remove tape strips completely unless your doctor says it is okay. °· Check your cut area every day for signs of infection. Check for: °? More redness, swelling, or pain. °? More fluid or blood. °? Warmth. °? Pus or a bad smell. °· Ask your doctor how to clean the cut. This may include: °? Using mild soap and water. °? Using a clean towel to pat the cut dry after you clean it. °? Putting a cream or ointment on the cut. Do this only as told by your doctor. °? Covering the cut with a clean bandage. °· Ask your doctor when you can leave the cut uncovered. °· Do not take baths, swim, or use a hot tub until your doctor says it is okay. Ask your doctor if you can take showers. You may only be allowed to take sponge baths for bathing. °Medicines °· If you were prescribed an antibiotic medicine, cream, or ointment, take the antibiotic or put it on the cut as told by your doctor. Do not stop taking or putting on the antibiotic even if your condition gets better. °· Take  over-the-counter and prescription medicines only as told by your doctor. °General instructions °· Limit movement around your cut. This helps healing. °? Avoid straining, lifting, or exercise for the first month, or for as long as told by your doctor. °? Follow instructions from your doctor about going back to your normal activities. °? Ask your doctor what activities are safe. °· Protect your cut from the sun when you are outside for the first 6 months, or for as long as told by your doctor. Put on sunscreen around the scar or cover up the scar. °· Keep all follow-up visits as told by your doctor. This is important. °Contact a doctor if: °· Your have more redness, swelling, or pain around the cut. °· You have more fluid or blood coming from the cut. °· Your cut feels warm to the touch. °· You have pus or a bad smell coming from the cut. °· You have a fever or shaking chills. °· You feel sick to your stomach (nauseous) or you throw up (vomit). °· You are dizzy. °· Your stitches or staples come undone. °Get help right away if: °· You have a red streak coming from your cut. °· Your cut bleeds through the bandage and the bleeding does not stop with gentle pressure. °· The edges of your cut open   up and separate.  You have very bad (severe) pain.  You have a rash.  You are confused.  You pass out (faint).  You have trouble breathing and you have a fast heartbeat. This information is not intended to replace advice given to you by your health care provider. Make sure you discuss any questions you have with your health care provider. Document Released: 02/25/2012 Document Revised: 08/10/2016 Document Reviewed: 08/10/2016 Elsevier Interactive Patient Education  2017 [facility] Instructions  Activity: Get plenty of rest for the remainder of the day. A responsible individual must stay with you for 24 hours following the procedure.  For the next 24 hours, DO NOT: -Drive a  car -Paediatric nurse -Drink alcoholic beverages -Take any medication unless instructed by your physician -Make any legal decisions or sign important papers.  Meals: Start with liquid foods such as gelatin or soup. Progress to regular foods as tolerated. Avoid greasy, spicy, heavy foods. If nausea and/or vomiting occur, drink only clear liquids until the nausea and/or vomiting subsides. Call your physician if vomiting continues.  Special Instructions/Symptoms: Your throat may feel dry or sore from the anesthesia or the breathing tube placed in your throat during surgery. If this causes discomfort, gargle with warm salt water. The discomfort should disappear within 24 hours.  If you had a scopolamine patch placed behind your ear for the management of post- operative nausea and/or vomiting:  1. The medication in the patch is effective for 72 hours, after which it should be removed.  Wrap patch in a tissue and discard in the trash. Wash hands thoroughly with soap and water. 2. You may remove the patch earlier than 72 hours if you experience unpleasant side effects which may include dry mouth, dizziness or visual disturbances. 3. Avoid touching the patch. Wash your hands with soap and water after contact with the patch.

## 2018-08-20 NOTE — Anesthesia Postprocedure Evaluation (Signed)
Anesthesia Post Note  Patient: [name]  Procedure(s) Performed: SPINAL CORD STIMULATOR BATTERY EXCHANGE/REPLACE WITH RECHARGEABLE BATTERY (N/A )     Patient location during evaluation: PACU Anesthesia Type: General Level of consciousness: awake Pain management: pain level controlled Vital Signs Assessment: post-procedure vital signs reviewed and stable Respiratory status: spontaneous breathing Cardiovascular status: stable Postop Assessment: no apparent nausea or vomiting Anesthetic complications: no    Last Vitals:  Vitals:   08/20/18 1050 08/20/18 1118  BP: 135/79 112/77  Pulse: 89 93  Resp: 13 15  Temp:    SpO2: 95% 93%    Last Pain:  Vitals:   08/20/18 1118  TempSrc:   PainSc: 0-No pain                 Dechelle Attaway

## 2018-08-20 NOTE — Anesthesia Preprocedure Evaluation (Addendum)
Anesthesia Evaluation  Patient identified by MRN, date of birth, ID band Patient awake    Reviewed: Allergy & Precautions, NPO status , Patient's Chart, lab work & pertinent test results  Airway Mallampati: III  TM Distance: >3 FB Neck ROM: Full    Dental no notable dental hx. (+) Teeth Intact, Caps, Dental Advisory Given   Pulmonary sleep apnea , former smoker,    Pulmonary exam normal breath sounds clear to auscultation       Cardiovascular hypertension, Pt. on medications Normal cardiovascular exam Rhythm:Regular Rate:Normal  ECG: rate 94  Saw cardiologist Tollie Eth, MD for pre-op eval. Echo and stress test ordered, results below. Cleared for surgery at last office visit 01/20/18; prn f/u recommended.   Nuclear stress test 01/09/18 (Dr. Thurman Coyer office): 1.  Normal Lexiscan Myoview scan with no evidence of ischemia or infarction. 2.  Normal quantitative gated SPECT EF 63% with normal wall motion and normal thickening.   - Low risk scan. Good myocardial perfusion and LV function.  May proceed with surgery.  Echo 12/27/17 (Dr. Thurman Coyer office): 1.  Moderate concentric LVH with normal global wall motion.  Grade I diastolic dysfunction.  Estimated EF 55%. 2.  Trace tricuspid and pulmonic regurgitation.     Neuro/Psych PSYCHIATRIC DISORDERS Anxiety Depression Colitis  Neuromuscular disease    GI/Hepatic Neg liver ROS, GERD  Medicated and Poorly Controlled,  Endo/Other  Hypothyroidism   Renal/GU CRFRenal diseaseCr: 1.16     Musculoskeletal negative musculoskeletal ROS (+)   Abdominal (+) + obese,   Peds  Hematology HLD   Anesthesia Other Findings Chronic back pain  Reproductive/Obstetrics                            Anesthesia Physical  Anesthesia Plan  ASA: III  Anesthesia Plan: General   Post-op Pain Management:    Induction: Intravenous  PONV Risk Score and Plan: 3 and  Dexamethasone, Ondansetron and Treatment may vary due to age or medical condition  Airway Management Planned: Oral ETT  Additional Equipment:   Intra-op Plan:   Post-operative Plan: Extubation in OR  Informed Consent: I have reviewed the patients History and Physical, chart, labs and discussed the procedure including the risks, benefits and alternatives for the proposed anesthesia with the patient or authorized representative who has indicated his/her understanding and acceptance.   Dental advisory given  Plan Discussed with: CRNA  Anesthesia Plan Comments:         Anesthesia Quick Evaluation

## 2018-08-20 NOTE — Anesthesia Procedure Notes (Signed)
Procedure Name: Intubation Date/Time: 08/20/2018 8:34 AM Performed by: Jenne Campus, CRNA Pre-anesthesia Checklist: Patient identified, Emergency Drugs available, Suction available and Patient being monitored Patient Re-evaluated:Patient Re-evaluated prior to induction Oxygen Delivery Method: Circle System Utilized Preoxygenation: Pre-oxygenation with 100% oxygen Induction Type: IV induction and Cricoid Pressure applied Ventilation: Mask ventilation without difficulty Laryngoscope Size: Miller and 2 Grade View: Grade I Tube type: Oral Tube size: 7.0 mm Number of attempts: 1 Airway Equipment and Method: Stylet and Oral airway Placement Confirmation: ETT inserted through vocal cords under direct vision,  positive ETCO2 and breath sounds checked- equal and bilateral Secured at: 21 cm Tube secured with: Tape Dental Injury: Teeth and Oropharynx as per pre-operative assessment

## 2018-08-20 NOTE — Brief Op Note (Signed)
08/20/2018  10:36 AM  PATIENT:  [name]  72 y.o. female  PRE-OPERATIVE DIAGNOSIS:  Spinal cord stimulator battery dysfunction  POST-OPERATIVE DIAGNOSIS:  Spinal cord stimulator battery dysfunction  PROCEDURE:  Procedure(s): SPINAL CORD STIMULATOR BATTERY EXCHANGE/REPLACE WITH RECHARGEABLE BATTERY (N/A)  SURGEON:  Surgeon(s) and Role:    Melina Schools, MD - Primary  PHYSICIAN ASSISTANT:   ASSISTANTS: Carmen Mayo, PA  ANESTHESIA:   general  EBL:  10 mL   BLOOD ADMINISTERED:none  DRAINS: none   LOCAL MEDICATIONS USED:  MARCAINE    and OTHER exparel  SPECIMEN:  No Specimen  DISPOSITION OF SPECIMEN:  N/A  COUNTS:  YES  TOURNIQUET:  * No tourniquets in log *  DICTATION: .Dragon Dictation  PLAN OF CARE: Discharge to home after PACU  PATIENT DISPOSITION:  PACU - hemodynamically stable.

## 2018-08-20 NOTE — Op Note (Signed)
Operative report  Preoperative diagnosis: Failed spinal cord stimulator battery  Postoperative diagnosis: Same  Operative procedure:  Removal and replacement of spinal cord stimulator battery  First assistant: Ronette Deter, PA  Complications: None  Implant: Abbott spine proclaim rechargeable impulse battery  Operative procedure: Patient is brought the operating room and placed upon the operating table.  After successful induction of general anesthesia and endotracheal the patient patient was turned into a semi-lateral decubitus position on the beanbag table.  Axillary roll was placed and all bony prominences well-padded.  The vein bag was deflated in order to hold the patient in place.  The battery site was then prepped and draped in a standard fashion.  Timeout was taken to confirm patient procedure and all other important data.  The incision site was infiltrated with quarter percent Marcaine with Exparel.  The previous incision was re-incised and I sharply dissected down to the deep fascia.  I bluntly dissected through the deep fascia until I could palpate the battery.  This was then mobilized and brought out of the wound.  The single lead was disconnected and the new battery was opened.  Was connected and at this point there was noted to be high resistance.  I then took the direct lead tester and secured the lead to that.  There was still high resistance.  According to the representative some of the leads were giving him high resistance but there were ones that were functioning that he could use.  I then reconnected the battery to the lead and locked the lead in place with the torque wrench.  I then placed the battery into the deep wound and secured it to the deep fascia with two #1 Vicryl sutures.  The battery was then retested and according to the Abbott spine wrap although some of the resistances were high it was functioning and he could utilize it to obtain the postoperative analgesia and pain  control that was needed.  At this point the wound was copiously irrigated with normal saline and closed in a layered fashion with interrupted #1 Vicryl suture, 2-0 Vicryl suture, 3-0 Monocryl.  Steri-Strips and the remaining portion of Exparel with Marcaine were injected for postoperative analgesia.  Dry dressing was applied and the patient was ultimately extubated transfer the PACU without incident.  The end of the case all needle sponge counts were correct.  There are no adverse intraoperative events.

## 2018-08-20 NOTE — Transfer of Care (Signed)
Immediate Anesthesia Transfer of Care Note  Patient: [name]  Procedure(s) Performed: SPINAL CORD STIMULATOR BATTERY EXCHANGE/REPLACE WITH RECHARGEABLE BATTERY (N/A )  Patient Location: PACU  Anesthesia Type:General  Level of Consciousness: awake, oriented and patient cooperative  Airway & Oxygen Therapy: Patient Spontanous Breathing and Patient connected to nasal cannula oxygen  Post-op Assessment: Report given to RN and Post -op Vital signs reviewed and stable  Post vital signs: Reviewed  Last Vitals:  Vitals Value Taken Time  BP 159/119 08/20/2018 10:05 AM  Temp    Pulse 93 08/20/2018 10:05 AM  Resp 44 08/20/2018 10:05 AM  SpO2 100 % 08/20/2018 10:05 AM  Vitals shown include unvalidated device data.  Last Pain:  Vitals:   08/20/18 0625  TempSrc: Oral  PainSc: 0-No pain      Patients Stated Pain Goal: 3 (63/14/97 0263)  Complications: No apparent anesthesia complications

## 2018-08-22 ENCOUNTER — Encounter (HOSPITAL_COMMUNITY): Payer: Self-pay | Admitting: Orthopedic Surgery

## 2018-10-20 DIAGNOSIS — Z6834 Body mass index (BMI) 34.0-34.9, adult: Secondary | ICD-10-CM | POA: Diagnosis not present

## 2018-10-20 DIAGNOSIS — E782 Mixed hyperlipidemia: Secondary | ICD-10-CM | POA: Diagnosis not present

## 2018-10-20 DIAGNOSIS — N183 Chronic kidney disease, stage 3 (moderate): Secondary | ICD-10-CM | POA: Diagnosis not present

## 2018-10-20 DIAGNOSIS — R82998 Other abnormal findings in urine: Secondary | ICD-10-CM | POA: Diagnosis not present

## 2018-10-20 DIAGNOSIS — F322 Major depressive disorder, single episode, severe without psychotic features: Secondary | ICD-10-CM | POA: Diagnosis not present

## 2018-10-20 DIAGNOSIS — I129 Hypertensive chronic kidney disease with stage 1 through stage 4 chronic kidney disease, or unspecified chronic kidney disease: Secondary | ICD-10-CM | POA: Diagnosis not present

## 2018-10-20 DIAGNOSIS — R7301 Impaired fasting glucose: Secondary | ICD-10-CM | POA: Diagnosis not present

## 2018-10-20 DIAGNOSIS — M549 Dorsalgia, unspecified: Secondary | ICD-10-CM | POA: Diagnosis not present

## 2018-10-20 DIAGNOSIS — E039 Hypothyroidism, unspecified: Secondary | ICD-10-CM | POA: Diagnosis not present

## 2018-11-04 DIAGNOSIS — I129 Hypertensive chronic kidney disease with stage 1 through stage 4 chronic kidney disease, or unspecified chronic kidney disease: Secondary | ICD-10-CM | POA: Diagnosis not present

## 2018-11-05 DIAGNOSIS — Z79899 Other long term (current) drug therapy: Secondary | ICD-10-CM | POA: Diagnosis not present

## 2018-11-05 DIAGNOSIS — Z5181 Encounter for therapeutic drug level monitoring: Secondary | ICD-10-CM | POA: Diagnosis not present

## 2018-11-05 DIAGNOSIS — M545 Low back pain: Secondary | ICD-10-CM | POA: Diagnosis not present

## 2018-11-05 DIAGNOSIS — G894 Chronic pain syndrome: Secondary | ICD-10-CM | POA: Diagnosis not present

## 2018-11-05 DIAGNOSIS — M5136 Other intervertebral disc degeneration, lumbar region: Secondary | ICD-10-CM | POA: Diagnosis not present

## 2019-01-21 DIAGNOSIS — F411 Generalized anxiety disorder: Secondary | ICD-10-CM | POA: Diagnosis not present

## 2019-01-21 DIAGNOSIS — F3176 Bipolar disorder, in full remission, most recent episode depressed: Secondary | ICD-10-CM | POA: Diagnosis not present

## 2019-02-13 DIAGNOSIS — N270 Small kidney, unilateral: Secondary | ICD-10-CM | POA: Diagnosis not present

## 2019-02-13 DIAGNOSIS — N1339 Other hydronephrosis: Secondary | ICD-10-CM | POA: Diagnosis not present

## 2019-03-25 DIAGNOSIS — M545 Low back pain: Secondary | ICD-10-CM | POA: Diagnosis not present

## 2019-03-25 DIAGNOSIS — H5213 Myopia, bilateral: Secondary | ICD-10-CM | POA: Diagnosis not present

## 2019-03-25 DIAGNOSIS — M549 Dorsalgia, unspecified: Secondary | ICD-10-CM | POA: Diagnosis not present

## 2019-04-02 DIAGNOSIS — M5136 Other intervertebral disc degeneration, lumbar region: Secondary | ICD-10-CM | POA: Diagnosis not present

## 2019-04-02 DIAGNOSIS — Z79899 Other long term (current) drug therapy: Secondary | ICD-10-CM | POA: Diagnosis not present

## 2019-04-03 DIAGNOSIS — Z4789 Encounter for other orthopedic aftercare: Secondary | ICD-10-CM | POA: Diagnosis not present

## 2019-04-03 DIAGNOSIS — M25511 Pain in right shoulder: Secondary | ICD-10-CM | POA: Diagnosis not present

## 2019-04-03 DIAGNOSIS — M19011 Primary osteoarthritis, right shoulder: Secondary | ICD-10-CM | POA: Diagnosis not present

## 2019-04-03 DIAGNOSIS — M25512 Pain in left shoulder: Secondary | ICD-10-CM | POA: Diagnosis not present

## 2019-04-24 DIAGNOSIS — M545 Low back pain: Secondary | ICD-10-CM | POA: Diagnosis not present

## 2019-04-24 DIAGNOSIS — Z96651 Presence of right artificial knee joint: Secondary | ICD-10-CM | POA: Diagnosis not present

## 2019-04-30 DIAGNOSIS — M4316 Spondylolisthesis, lumbar region: Secondary | ICD-10-CM | POA: Diagnosis not present

## 2019-04-30 DIAGNOSIS — M47896 Other spondylosis, lumbar region: Secondary | ICD-10-CM | POA: Diagnosis not present

## 2019-04-30 DIAGNOSIS — M545 Low back pain: Secondary | ICD-10-CM | POA: Diagnosis not present

## 2019-05-15 DIAGNOSIS — M5136 Other intervertebral disc degeneration, lumbar region: Secondary | ICD-10-CM | POA: Diagnosis not present

## 2019-05-15 DIAGNOSIS — G894 Chronic pain syndrome: Secondary | ICD-10-CM | POA: Diagnosis not present

## 2019-05-18 DIAGNOSIS — M13812 Other specified arthritis, left shoulder: Secondary | ICD-10-CM | POA: Diagnosis not present

## 2019-05-18 DIAGNOSIS — M19011 Primary osteoarthritis, right shoulder: Secondary | ICD-10-CM | POA: Diagnosis not present

## 2019-05-18 DIAGNOSIS — M25511 Pain in right shoulder: Secondary | ICD-10-CM | POA: Diagnosis not present

## 2019-05-18 DIAGNOSIS — M25512 Pain in left shoulder: Secondary | ICD-10-CM | POA: Diagnosis not present

## 2019-06-02 DIAGNOSIS — H524 Presbyopia: Secondary | ICD-10-CM | POA: Diagnosis not present

## 2019-06-02 DIAGNOSIS — H5203 Hypermetropia, bilateral: Secondary | ICD-10-CM | POA: Diagnosis not present

## 2019-06-02 DIAGNOSIS — H52223 Regular astigmatism, bilateral: Secondary | ICD-10-CM | POA: Diagnosis not present

## 2019-06-02 DIAGNOSIS — H2513 Age-related nuclear cataract, bilateral: Secondary | ICD-10-CM | POA: Diagnosis not present

## 2019-06-02 DIAGNOSIS — H47022 Hemorrhage in optic nerve sheath, left eye: Secondary | ICD-10-CM | POA: Diagnosis not present

## 2019-06-05 IMAGING — CT CT ABD-PELV W/O CM
3 of 4 series · 13 of 36 positions shown, 19 images · IV contrast (READICAT/WATER)
Comparison: CT scan of July 30, 2017.

CLINICAL DATA: Generalized abdominal pain for 1 month.

EXAM:
CT ABDOMEN AND PELVIS WITHOUT CONTRAST
TECHNIQUE: Multidetector CT imaging of the abdomen and pelvis was performed
following the standard protocol without IV contrast.

[Series 3: abd/pelvis w/o · axial · non-contrast · 0.86mm/px · z∈[-380,-45]mm · 8 of 87 slices shown, 13 images]
[im 10/87  soft-tissue]
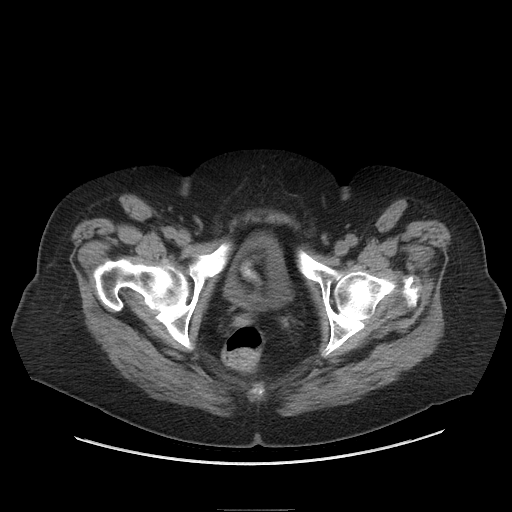
[im 10/87  bone]
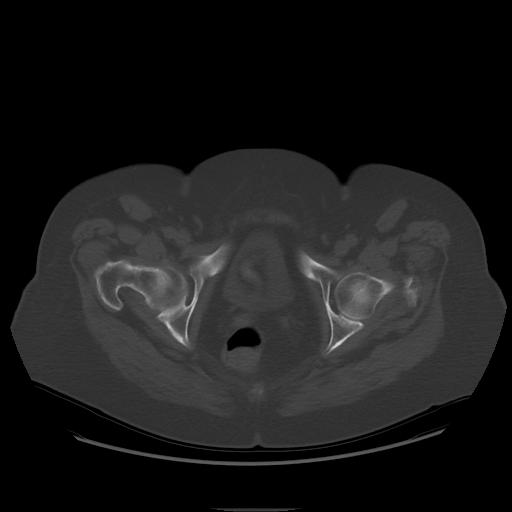
[im 20/87  soft-tissue]
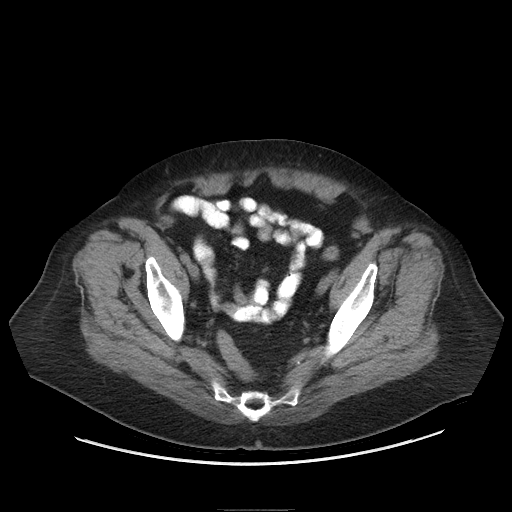
[im 29/87  soft-tissue]
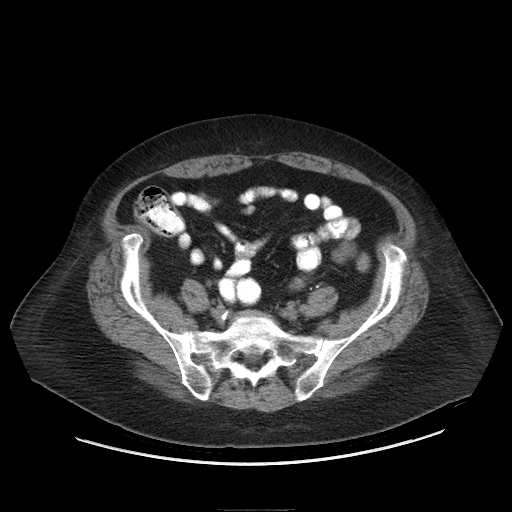
[im 39/87  soft-tissue]
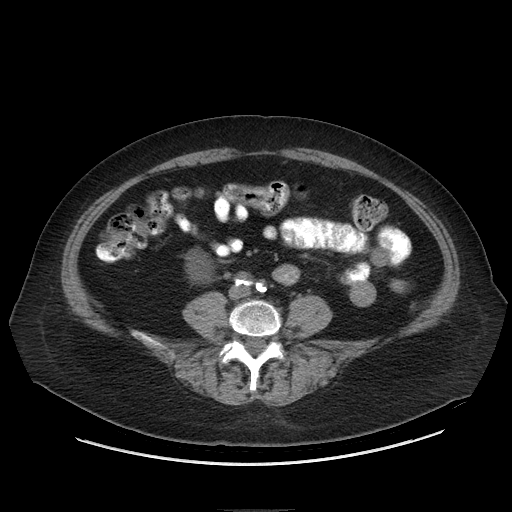
[im 48/87  soft-tissue]
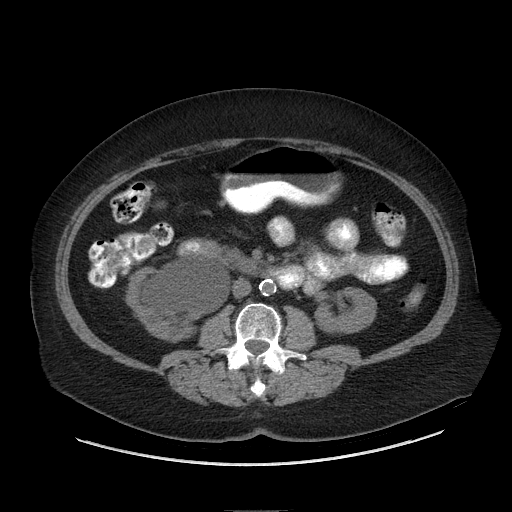
[im 48/87  lung]
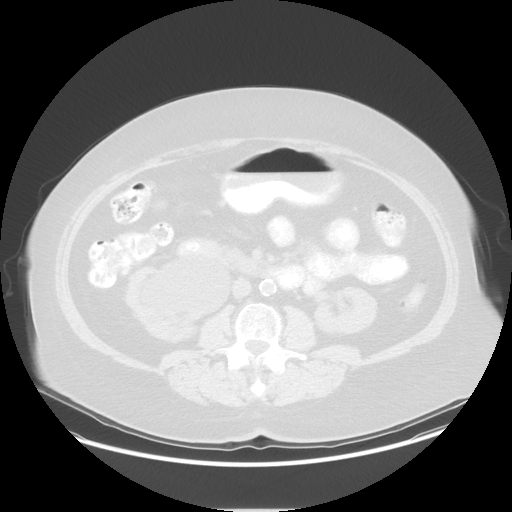
[im 58/87  soft-tissue]
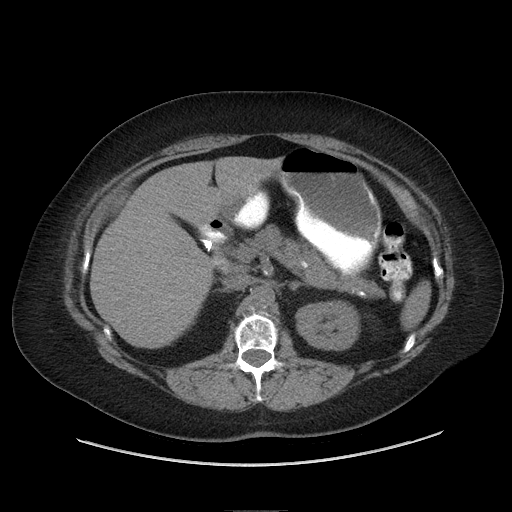
[im 58/87  lung]
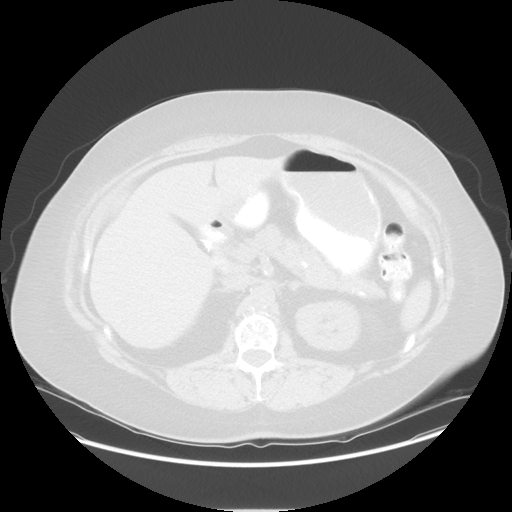
[im 67/87  soft-tissue]
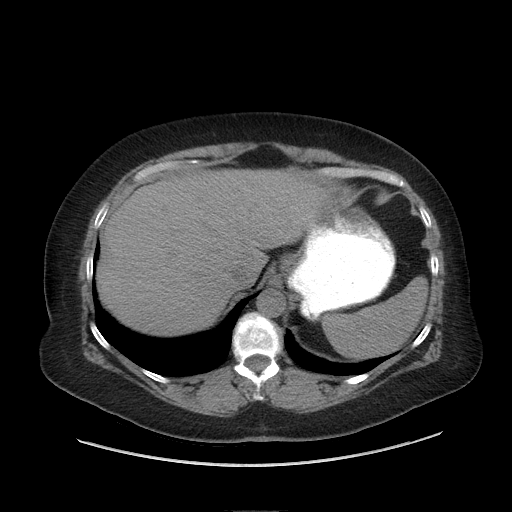
[im 67/87  lung]
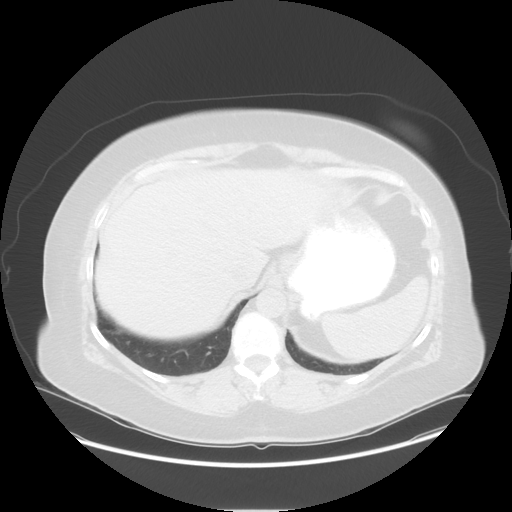
[im 77/87  soft-tissue]
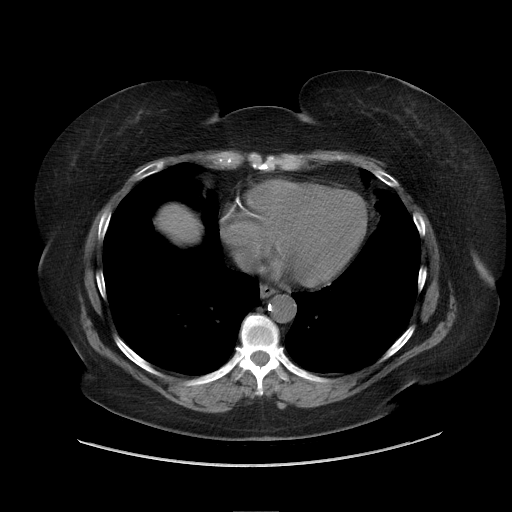
[im 77/87  lung]
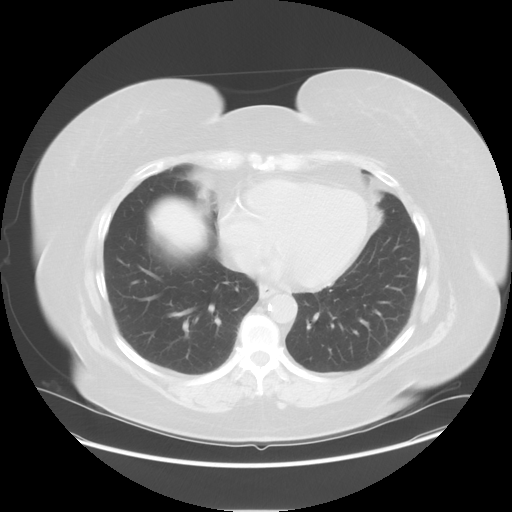

[Series 601: coronal body · coronal · 0.87mm/px · 1 of 134 slices shown, 2 images]
[im 45/134  soft-tissue]
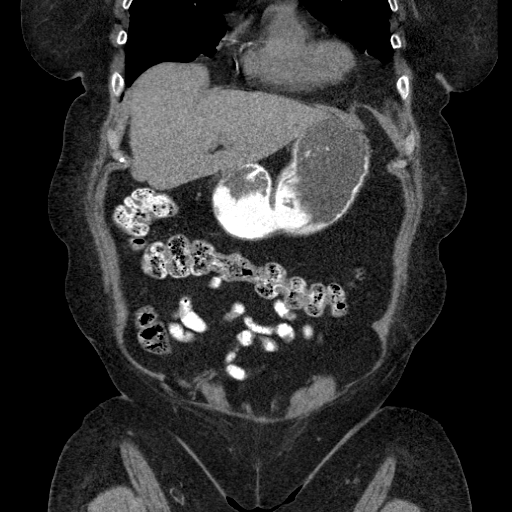
[im 45/134  bone]
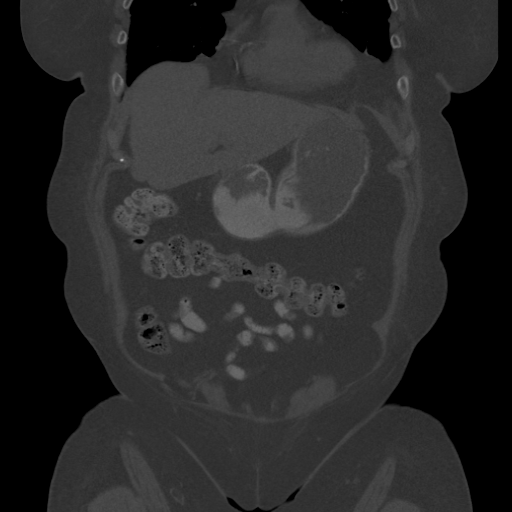

[Series 602: sagittal body · sagittal · 0.87mm/px · 4 of 175 slices shown]
[im 20/175  soft-tissue]
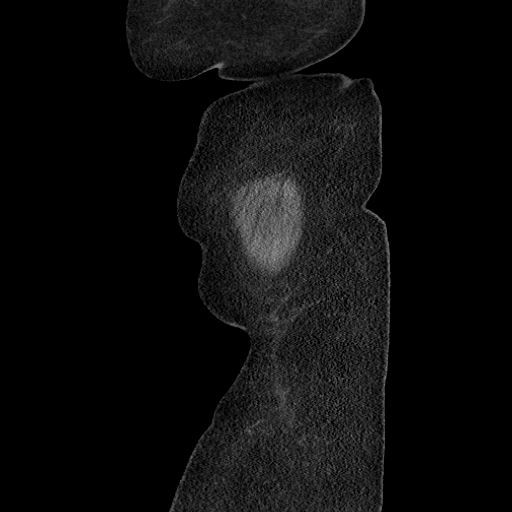
[im 39/175  soft-tissue]
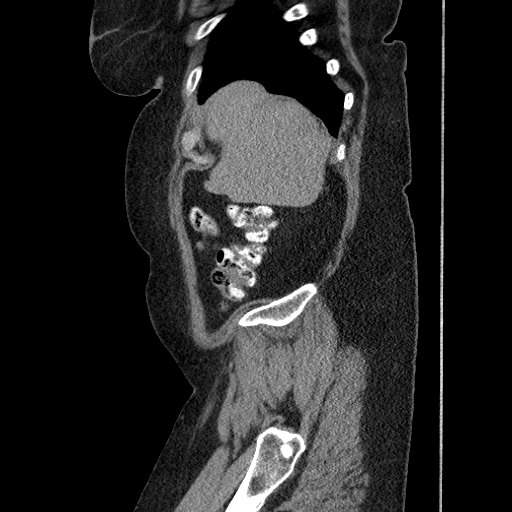
[im 59/175  soft-tissue]
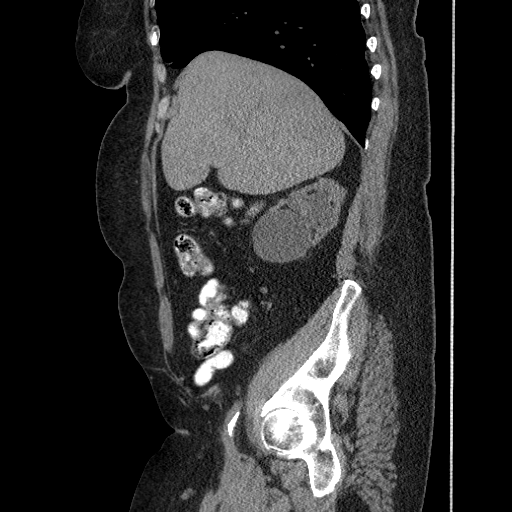
[im 78/175  soft-tissue]
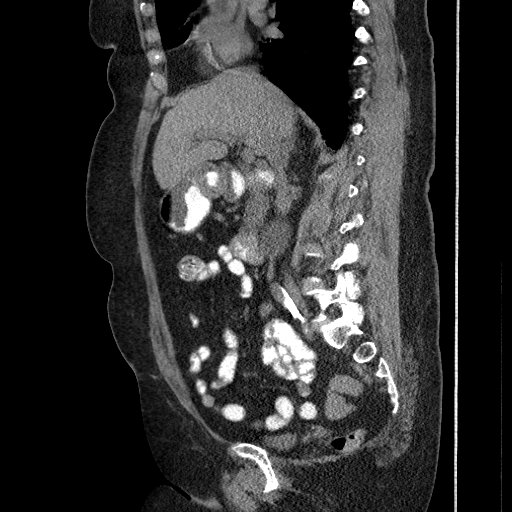

[13 of 36 positions shown; findings below may reference images not displayed]

FINDINGS: Lower chest: No acute abnormality.

Hepatobiliary: No focal liver abnormality is seen. Status post
cholecystectomy. No biliary dilatation.

Pancreas: Unremarkable. No pancreatic ductal dilatation or
surrounding inflammatory changes.

Spleen: Normal in size without focal abnormality.

Adrenals/Urinary Tract: Adrenal glands appear normal. Cortical
scarring is seen involving both kidneys. Severe right hydronephrosis
is noted most likely due to long-standing UPJ obstruction. No renal
or ureteral calculi are noted. Urinary bladder is decompressed.

Stomach/Bowel: There is no evidence of bowel obstruction or
inflammation. The stomach is unremarkable. The appendix is not
visualized.

Vascular/Lymphatic: Aortic atherosclerosis. No enlarged abdominal or
pelvic lymph nodes.

Reproductive: Status post hysterectomy. No adnexal masses.

Other: No abdominal wall hernia or abnormality. No abdominopelvic
ascites.

Musculoskeletal: No acute or significant osseous findings.
IMPRESSION: Continued presence of severe right hydronephrosis secondary to
long-standing UPJ obstruction. No renal or ureteral calculi are
noted. Bilateral renal cortical scarring is again noted.

No other significant abnormality seen in the abdomen or pelvis.

## 2019-07-06 ENCOUNTER — Encounter (HOSPITAL_COMMUNITY): Payer: Self-pay

## 2019-07-06 ENCOUNTER — Other Ambulatory Visit (HOSPITAL_COMMUNITY)
Admission: RE | Admit: 2019-07-06 | Discharge: 2019-07-06 | Disposition: A | Discharge status: Home / Self Care | Payer: Medicare HMO | Source: Ambulatory Visit | Attending: Orthopedic Surgery | Admitting: Orthopedic Surgery

## 2019-07-06 DIAGNOSIS — Z1159 Encounter for screening for other viral diseases: Secondary | ICD-10-CM | POA: Diagnosis not present

## 2019-07-06 NOTE — Patient Instructions (Addendum)
DUE TO COVID-19 ONLY ONE VISITOR IS ALLOWED IN THE HOSPITAL AT THIS TIME   COVID SWAB TESTING MUST BE COMPLETED ON:                (Must self quarantine after testing. Follow instructions on handout.)   Your procedure is scheduled on: Thursday, July 09, 2019   Surgery Time:  7:30AM-9:30AM   Report to [place]  Entrance   Report to Short Stay at 5:30 AM   Call this number if you have problems the morning of surgery [phone]   Bring CPAP mask and tubing day of surgery   Do not eat food:After Midnight.   May have liquids until 4:30AM day of surgery   CLEAR LIQUID DIET  Foods Allowed                                                                     Foods Excluded  Water, Black Coffee and tea, regular and decaf                             liquids that you cannot  Plain Jell-O in any flavor                                             see through such as: Fruit ices (not with fruit pulp)                                     milk, soups, orange juice  Iced Popsicles                                    All solid food Carbonated beverages, regular and diet                                    Cranberry, grape and apple juices Sports drinks like Gatorade Lightly seasoned clear broth or consume(fat free) Sugar, honey syrup  Sample Menu Breakfast                                Lunch                                     Supper Cranberry juice                    Beef broth                            Chicken broth Jell-O  Grape juice                           Apple juice Coffee or tea                        Jell-O                                      Popsicle                                                Coffee or tea                        Coffee or tea   Complete one Ensure drink the morning of surgery at 4:30AM the day of surgery.   Brush your teeth the morning of surgery.   Do NOT smoke after Midnight   Take these medicines the  morning of surgery with A SIP OF WATER: Levothyroxine, Vortioxetine, Clonazepam if needed                               You may not have any metal on your body including hair pins, jewelry, and body piercings             Do not wear make-up, lotions, powders, perfumes/cologne, or deodorant             Do not wear nail polish.  Do not shave  48 hours prior to surgery.                 Do not bring valuables to the hospital. [place].   Contacts, dentures or bridgework may not be worn into surgery.   Bring small overnight bag day of surgery.   Special Instructions: Bring a copy of your healthcare power of attorney and living will documents         the day of surgery if you haven't scanned them in before.              Please read over the following fact sheets you were given:  [facility] - Preparing for Surgery Before surgery, you can play an important role.  Because skin is not sterile, your skin needs to be as free of germs as possible.  You can reduce the number of germs on your skin by washing with CHG (chlorahexidine gluconate) soap before surgery.  CHG is an antiseptic cleaner which kills germs and bonds with the skin to continue killing germs even after washing. Please DO NOT use if you have an allergy to CHG or antibacterial soaps.  If your skin becomes reddened/irritated stop using the CHG and inform your nurse when you arrive at Short Stay. Do not shave (including legs and underarms) for at least 48 hours prior to the first CHG shower.  You may shave your face/neck.  Please follow these instructions carefully:  1.  Shower with CHG Soap the night before surgery and the  morning of surgery.  2.  If you  choose to wash your hair, wash your hair first as usual with your normal  shampoo.  3.  After you shampoo, rinse your hair and body thoroughly to remove the shampoo.                             4.  Use CHG as you would any other liquid  soap.  You can apply chg directly to the skin and wash.  Gently with a scrungie or clean washcloth.  5.  Apply the CHG Soap to your body ONLY FROM THE NECK DOWN.   Do   not use on face/ open                           Wound or open sores. Avoid contact with eyes, ears mouth and   genitals (private parts).                       Wash face,  Genitals (private parts) with your normal soap.             6.  Wash thoroughly, paying special attention to the area where your    surgery  will be performed.  7.  Thoroughly rinse your body with warm water from the neck down.  8.  DO NOT shower/wash with your normal soap after using and rinsing off the CHG Soap.                9.  Pat yourself dry with a clean towel.            10.  Wear clean pajamas.            11.  Place clean sheets on your bed the night of your first shower and do not  sleep with pets. Day of Surgery : Do not apply any lotions/deodorants the morning of surgery.  Please wear clean clothes to the hospital/surgery center.  FAILURE TO FOLLOW THESE INSTRUCTIONS MAY RESULT IN THE CANCELLATION OF YOUR SURGERY  PATIENT SIGNATURE_________________________________  NURSE SIGNATURE__________________________________  ________________________________________________________________________   [name]  An incentive spirometer is a tool that can help keep your lungs clear and active. This tool measures how well you are filling your lungs with each breath. Taking long deep breaths may help reverse or decrease the chance of developing breathing (pulmonary) problems (especially infection) following:  A long period of time when you are unable to move or be active. BEFORE THE PROCEDURE   If the spirometer includes an indicator to show your best effort, your nurse or respiratory therapist will set it to a desired goal.  If possible, sit up straight or lean slightly forward. Try not to slouch.  Hold the incentive spirometer in an upright  position. INSTRUCTIONS FOR USE  1. Sit on the edge of your bed if possible, or sit up as far as you can in bed or on a chair. 2. Hold the incentive spirometer in an upright position. 3. Breathe out normally. 4. Place the mouthpiece in your mouth and seal your lips tightly around it. 5. Breathe in slowly and as deeply as possible, raising the piston or the ball toward the top of the column. 6. Hold your breath for 3-5 seconds or for as long as possible. Allow the piston or ball to fall to the bottom of the column. 7. Remove the mouthpiece from your mouth and breathe  out normally. 8. Rest for a few seconds and repeat Steps 1 through 7 at least 10 times every 1-2 hours when you are awake. Take your time and take a few normal breaths between deep breaths. 9. The spirometer may include an indicator to show your best effort. Use the indicator as a goal to work toward during each repetition. 10. After each set of 10 deep breaths, practice coughing to be sure your lungs are clear. If you have an incision (the cut made at the time of surgery), support your incision when coughing by placing a pillow or rolled up towels firmly against it. Once you are able to get out of bed, walk around indoors and cough well. You may stop using the incentive spirometer when instructed by your caregiver.  RISKS AND COMPLICATIONS  Take your time so you do not get dizzy or light-headed.  If you are in pain, you may need to take or ask for pain medication before doing incentive spirometry. It is harder to take a deep breath if you are having pain. AFTER USE  Rest and breathe slowly and easily.  It can be helpful to keep track of a log of your progress. Your caregiver can provide you with a simple table to help with this. If you are using the spirometer at home, follow these instructions: [place] IF:   You are having difficultly using the spirometer.  You have trouble using the spirometer as often as  instructed.  Your pain medication is not giving enough relief while using the spirometer.  You develop fever of 100.5 F (38.1 C) or higher. SEEK IMMEDIATE MEDICAL CARE IF:   You cough up bloody sputum that had not been present before.  You develop fever of 102 F (38.9 C) or greater.  You develop worsening pain at or near the incision site. MAKE SURE YOU:   Understand these instructions.  Will watch your condition.  Will get help right away if you are not doing well or get worse. Document Released: 04/15/2007 Document Revised: 02/25/2012 Document Reviewed: 06/16/2007 [facility] Patient Information 2014 [place], [place].   ________________________________________________________________________

## 2019-07-07 ENCOUNTER — Other Ambulatory Visit: Payer: Self-pay

## 2019-07-07 ENCOUNTER — Encounter (HOSPITAL_COMMUNITY): Payer: Self-pay

## 2019-07-07 ENCOUNTER — Encounter (HOSPITAL_COMMUNITY)
Admission: RE | Admit: 2019-07-07 | Discharge: 2019-07-07 | Disposition: A | Discharge status: Home / Self Care | Payer: Medicare HMO | Source: Ambulatory Visit | Attending: Orthopedic Surgery | Admitting: Orthopedic Surgery

## 2019-07-07 DIAGNOSIS — M75101 Unspecified rotator cuff tear or rupture of right shoulder, not specified as traumatic: Secondary | ICD-10-CM | POA: Insufficient documentation

## 2019-07-07 DIAGNOSIS — Z881 Allergy status to other antibiotic agents status: Secondary | ICD-10-CM | POA: Diagnosis not present

## 2019-07-07 DIAGNOSIS — Z888 Allergy status to other drugs, medicaments and biological substances status: Secondary | ICD-10-CM | POA: Diagnosis not present

## 2019-07-07 DIAGNOSIS — F419 Anxiety disorder, unspecified: Secondary | ICD-10-CM | POA: Diagnosis present

## 2019-07-07 DIAGNOSIS — N183 Chronic kidney disease, stage 3 (moderate): Secondary | ICD-10-CM | POA: Diagnosis not present

## 2019-07-07 DIAGNOSIS — Z9682 Presence of neurostimulator: Secondary | ICD-10-CM | POA: Diagnosis not present

## 2019-07-07 DIAGNOSIS — F329 Major depressive disorder, single episode, unspecified: Secondary | ICD-10-CM | POA: Diagnosis present

## 2019-07-07 DIAGNOSIS — K219 Gastro-esophageal reflux disease without esophagitis: Secondary | ICD-10-CM | POA: Diagnosis present

## 2019-07-07 DIAGNOSIS — Z96651 Presence of right artificial knee joint: Secondary | ICD-10-CM | POA: Diagnosis present

## 2019-07-07 DIAGNOSIS — Z01818 Encounter for other preprocedural examination: Secondary | ICD-10-CM | POA: Insufficient documentation

## 2019-07-07 DIAGNOSIS — G8918 Other acute postprocedural pain: Secondary | ICD-10-CM | POA: Diagnosis not present

## 2019-07-07 DIAGNOSIS — M19011 Primary osteoarthritis, right shoulder: Secondary | ICD-10-CM | POA: Diagnosis present

## 2019-07-07 DIAGNOSIS — Z8249 Family history of ischemic heart disease and other diseases of the circulatory system: Secondary | ICD-10-CM | POA: Diagnosis not present

## 2019-07-07 DIAGNOSIS — I129 Hypertensive chronic kidney disease with stage 1 through stage 4 chronic kidney disease, or unspecified chronic kidney disease: Secondary | ICD-10-CM | POA: Diagnosis present

## 2019-07-07 DIAGNOSIS — Z87891 Personal history of nicotine dependence: Secondary | ICD-10-CM | POA: Diagnosis not present

## 2019-07-07 DIAGNOSIS — Z882 Allergy status to sulfonamides status: Secondary | ICD-10-CM | POA: Diagnosis not present

## 2019-07-07 DIAGNOSIS — N189 Chronic kidney disease, unspecified: Secondary | ICD-10-CM | POA: Diagnosis present

## 2019-07-07 DIAGNOSIS — Z79890 Hormone replacement therapy (postmenopausal): Secondary | ICD-10-CM | POA: Diagnosis not present

## 2019-07-07 DIAGNOSIS — G4730 Sleep apnea, unspecified: Secondary | ICD-10-CM | POA: Diagnosis present

## 2019-07-07 DIAGNOSIS — Z820 Family history of epilepsy and other diseases of the nervous system: Secondary | ICD-10-CM | POA: Diagnosis not present

## 2019-07-07 HISTORY — DX: Myoneural disorder, unspecified: G70.9

## 2019-07-07 LAB — SURGICAL PCR SCREEN
MRSA, PCR: NEGATIVE
Staphylococcus aureus: NEGATIVE

## 2019-07-07 LAB — BASIC METABOLIC PANEL
Anion gap: 10 (ref 5–15)
BUN: 26 mg/dL — ABNORMAL HIGH (ref 8–23)
CO2: 24 mmol/L (ref 22–32)
Calcium: 9.2 mg/dL (ref 8.9–10.3)
Chloride: 105 mmol/L (ref 98–111)
Creatinine, Ser: 1.49 mg/dL — ABNORMAL HIGH (ref 0.44–1.00)
GFR calc Af Amer: 40 mL/min — ABNORMAL LOW (ref >60)
GFR calc non Af Amer: 35 mL/min — ABNORMAL LOW (ref >60)
Glucose, Bld: 102 mg/dL — ABNORMAL HIGH (ref 70–99)
Potassium: 4.6 mmol/L (ref 3.5–5.1)
Sodium: 139 mmol/L (ref 135–145)

## 2019-07-07 LAB — CBC
HCT: 41 % (ref 36.0–46.0)
Hemoglobin: 12.6 g/dL (ref 12.0–15.0)
MCH: 27.3 pg (ref 26.0–34.0)
MCHC: 30.7 g/dL (ref 30.0–36.0)
MCV: 88.9 fL (ref 80.0–100.0)
Platelets: 320 10*3/uL (ref 150–400)
RBC: 4.61 MIL/uL (ref 3.87–5.11)
RDW: 13.8 % (ref 11.5–15.5)
WBC: 8.1 10*3/uL (ref 4.0–10.5)
nRBC: 0 % (ref 0.0–0.2)

## 2019-07-07 LAB — SARS CORONAVIRUS 2 (TAT 6-24 HRS): SARS Coronavirus 2: NEGATIVE

## 2019-07-08 NOTE — Progress Notes (Signed)
Anesthesia Chart Review   Case: [id number] Date/Time: 07/09/19 0715   Procedure: REVERSE SHOULDER ARTHROPLASTY (Right ) - 153min   Anesthesia type: General   Pre-op diagnosis: right shoulder osteoarthritis   Location: [facility] ROOM 06 / [facility]   Surgeon: Justice Britain, MD      DISCUSSION:73 yo female former smoker for above procedure. Pertinent hx includes HTN, CKD, Anxiety, GERD, OSA not on CPAP, Dyspnea, Depression. S/p spinal cord stimulator insertion 01/30/18 and battery exchange 12/18/4578 without complication.  Pt was cleared by cardiologist Dr. Tollie Eth on 01/20/2018 prior to her surgery 01/30/2018 (clearance note under Media tab Correspondence dated 02/01/2018). January 2019 she had a Low Risk nuclear stress test and an Echo with EF 55%.  Anticipate she can proceed with procedure as scheduled barring acute status change.  VS: BP 98/75 (BP Location: Right Arm)   Pulse 92   Temp 36.7 C (Oral)   Resp 18   Ht 5\' 2"  (1.575 m)   Wt 87.1 kg   SpO2 95%   BMI 35.14 kg/m   PROVIDERS: [name], MD  Tollie Eth, MD is Cardiologist   Jamal Maes MD is Nephrologist LABS: Labs reviewed: Acceptable for surgery. and Per nephrology notes baseline Cr 1.2-1.4 (all labs ordered are listed, but only abnormal results are displayed)  Labs Reviewed  BASIC METABOLIC PANEL - Abnormal; Notable for the following components:      Result Value   Glucose, Bld 102 (*)    BUN 26 (*)    Creatinine, Ser 1.49 (*)    GFR calc non Af Amer 35 (*)    GFR calc Af Amer 40 (*)    All other components within normal limits  SURGICAL PCR SCREEN  CBC     IMAGES: NM renal imaging 01/27/18:  -Minimally dilated LEFT renal collecting system without evidence of urinary outflow obstruction. -Dilated RIGHT renal collecting system with adequate washout of tracer following Lasix administration indicating a lack of significant urinary outflow obstruction. -Markedly asymmetric renal function LEFT  kidney 77% versus RIGHT kidney 23%.  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 No renal or ureteral calculi are noted. Bilateral renal cortical scarring is again noted. -No other significant abnormality seen in the abdomen or pelvis.   EKG: 07/07/2019 Rate 77 bpm Normal sinus rhythm Nonspecific ST and T wave abnormality Abnormal ECG Twave changes more prominent compared to previous   CV: Nuclear stress test 01/09/18 (Dr. Thurman Coyer office): 1. Normal Lexiscan Myoview scan with no evidence of ischemia or infarction. 2. Normal quantitative gated SPECT EF 63% with normal wall motion and normal thickening.  -Low riskscan. Good myocardial perfusion and LV function. May proceed with surgery.  Echo 12/27/17 (Dr. Thurman Coyer office): 1. Moderate concentric LVH with normal global wall motion. Grade I diastolic dysfunction. Estimated EF 55%. 2. Trace tricuspid and pulmonic regurgitation. Past Medical History:  Diagnosis Date  . Anxiety   . Arthritis    cerv & lumbar arthritis  . Cervical dystonia    In remission  . Chronic kidney disease    R cyst, obstruction- Dr. Lorrene Reid seen pt. 12/2017  . Colitis   . Depression    [name] - sees counsellor, no meds.  Marland Kitchen Dyspnea   . GERD (gastroesophageal reflux disease)   . Hypertension   . Neuromuscular disorder ([place])   . Sleep apnea    told that she has apnea - on sleep study, cannot tolerate   . 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 ABDOMINAL HYSTERECTOMY    . APPENDECTOMY    . CHOLECYSTECTOMY    . JOINT REPLACEMENT  2012   R knee   . KNEE SURGERY    . ROTATOR CUFF REPAIR Right 2016  . SPINAL CORD STIMULATOR BATTERY EXCHANGE N/A 08/20/2018   Procedure: SPINAL CORD STIMULATOR BATTERY EXCHANGE/REPLACE WITH RECHARGEABLE BATTERY;  Surgeon: Melina Schools, MD;  Location: [place];   Service: Orthopedics;  Laterality: N/A;  . SPINAL CORD STIMULATOR INSERTION N/A 01/30/2018   Procedure: LUMBAR SPINAL CORD STIMULATOR INSERTION;  Surgeon: Melina Schools, MD;  Location: [place];  Service: Orthopedics;  Laterality: N/A;  2.5 hrs  . TONSILLECTOMY      MEDICATIONS: . atorvastatin (LIPITOR) 40 MG tablet  . clonazePAM (KLONOPIN) 1 MG tablet  . hydrochlorothiazide (HYDRODIURIL) 25 MG tablet  . HYDROcodone-acetaminophen (NORCO) 10-325 MG tablet  . levothyroxine (SYNTHROID, LEVOTHROID) 75 MCG tablet  . Melatonin 10 MG CAPS  . OLANZapine (ZYPREXA) 5 MG tablet  . olmesartan (BENICAR) 40 MG tablet  . omeprazole (PRILOSEC) 40 MG capsule  . raloxifene (EVISTA) 60 MG tablet  . vortioxetine HBr (TRINTELLIX) 10 MG TABS tablet  . zolpidem (AMBIEN) 10 MG tablet   No current facility-administered medications for this encounter.      Maia Plan [facility] Pre-Surgical Testing [phone] 07/08/19  11:06 AM

## 2019-07-08 NOTE — Anesthesia Preprocedure Evaluation (Addendum)
Anesthesia Evaluation  Patient identified by MRN, date of birth, ID band Patient awake    Reviewed: Allergy & Precautions, NPO status , Patient's Chart, lab work & pertinent test results  Airway Mallampati: II  TM Distance: >3 FB     Dental   Pulmonary former smoker,    breath sounds clear to auscultation       Cardiovascular hypertension,  Rhythm:Regular Rate:Normal     Neuro/Psych    GI/Hepatic GERD  ,  Endo/Other  Hypothyroidism   Renal/GU Renal disease     Musculoskeletal   Abdominal   Peds  Hematology   Anesthesia Other Findings   Reproductive/Obstetrics                           Anesthesia Physical Anesthesia Plan  ASA: III  Anesthesia Plan: General   Post-op Pain Management:  Regional for Post-op pain   Induction:   PONV Risk Score and Plan: 3  Airway Management Planned: Oral ETT  Additional Equipment:   Intra-op Plan:   Post-operative Plan: Extubation in OR  Informed Consent: I have reviewed the patients History and Physical, chart, labs and discussed the procedure including the risks, benefits and alternatives for the proposed anesthesia with the patient or authorized representative who has indicated his/her understanding and acceptance.     Dental advisory given  Plan Discussed with: Anesthesiologist and CRNA  Anesthesia Plan Comments: (See PAT note 07/07/2019, Konrad Felix, PA-C)      Anesthesia Quick Evaluation

## 2019-07-09 ENCOUNTER — Encounter (HOSPITAL_COMMUNITY): Payer: Self-pay | Admitting: *Deleted

## 2019-07-09 ENCOUNTER — Other Ambulatory Visit: Payer: Self-pay

## 2019-07-09 ENCOUNTER — Inpatient Hospital Stay (HOSPITAL_COMMUNITY)
Admission: RE | Admit: 2019-07-09 | Discharge: 2019-07-10 | DRG: 483 | Disposition: A | Discharge status: Home / Self Care | Payer: Medicare HMO | Attending: Orthopedic Surgery | Admitting: Orthopedic Surgery

## 2019-07-09 ENCOUNTER — Encounter (HOSPITAL_COMMUNITY)
Admission: RE | Disposition: A | Discharge status: Home / Self Care | Payer: Self-pay | Source: Home / Self Care | Attending: Orthopedic Surgery

## 2019-07-09 ENCOUNTER — Inpatient Hospital Stay (HOSPITAL_COMMUNITY): Payer: Medicare HMO | Admitting: Certified Registered"

## 2019-07-09 ENCOUNTER — Inpatient Hospital Stay (HOSPITAL_COMMUNITY): Payer: Medicare HMO | Admitting: Physician Assistant

## 2019-07-09 DIAGNOSIS — Z8249 Family history of ischemic heart disease and other diseases of the circulatory system: Secondary | ICD-10-CM | POA: Diagnosis not present

## 2019-07-09 DIAGNOSIS — F329 Major depressive disorder, single episode, unspecified: Secondary | ICD-10-CM | POA: Diagnosis present

## 2019-07-09 DIAGNOSIS — Z9682 Presence of neurostimulator: Secondary | ICD-10-CM

## 2019-07-09 DIAGNOSIS — Z888 Allergy status to other drugs, medicaments and biological substances status: Secondary | ICD-10-CM

## 2019-07-09 DIAGNOSIS — G4730 Sleep apnea, unspecified: Secondary | ICD-10-CM | POA: Diagnosis present

## 2019-07-09 DIAGNOSIS — Z881 Allergy status to other antibiotic agents status: Secondary | ICD-10-CM

## 2019-07-09 DIAGNOSIS — Z820 Family history of epilepsy and other diseases of the nervous system: Secondary | ICD-10-CM

## 2019-07-09 DIAGNOSIS — Z882 Allergy status to sulfonamides status: Secondary | ICD-10-CM | POA: Diagnosis not present

## 2019-07-09 DIAGNOSIS — M19011 Primary osteoarthritis, right shoulder: Principal | ICD-10-CM | POA: Diagnosis present

## 2019-07-09 DIAGNOSIS — N189 Chronic kidney disease, unspecified: Secondary | ICD-10-CM | POA: Diagnosis present

## 2019-07-09 DIAGNOSIS — K219 Gastro-esophageal reflux disease without esophagitis: Secondary | ICD-10-CM | POA: Diagnosis present

## 2019-07-09 DIAGNOSIS — F419 Anxiety disorder, unspecified: Secondary | ICD-10-CM | POA: Diagnosis present

## 2019-07-09 DIAGNOSIS — Z79890 Hormone replacement therapy (postmenopausal): Secondary | ICD-10-CM

## 2019-07-09 DIAGNOSIS — Z87891 Personal history of nicotine dependence: Secondary | ICD-10-CM | POA: Diagnosis not present

## 2019-07-09 DIAGNOSIS — I129 Hypertensive chronic kidney disease with stage 1 through stage 4 chronic kidney disease, or unspecified chronic kidney disease: Secondary | ICD-10-CM | POA: Diagnosis present

## 2019-07-09 DIAGNOSIS — Z96611 Presence of right artificial shoulder joint: Secondary | ICD-10-CM

## 2019-07-09 DIAGNOSIS — Z96651 Presence of right artificial knee joint: Secondary | ICD-10-CM | POA: Diagnosis present

## 2019-07-09 HISTORY — PX: REVERSE SHOULDER ARTHROPLASTY: SHX5054

## 2019-07-09 SURGERY — ARTHROPLASTY, SHOULDER, TOTAL, REVERSE
Anesthesia: General | Site: Shoulder | Laterality: Right

## 2019-07-09 MED ORDER — ZOLPIDEM TARTRATE 5 MG PO TABS
5.0000 mg | ORAL_TABLET | Freq: Every evening | ORAL | Status: DC | PRN
  Administered 2019-07-09: 5 mg via ORAL
  Filled 2019-07-09: qty 1

## 2019-07-09 MED ORDER — PHENYLEPHRINE HCL (PRESSORS) 10 MG/ML IV SOLN
INTRAVENOUS | Status: AC
  Filled 2019-07-09: qty 3

## 2019-07-09 MED ORDER — ACETAMINOPHEN 325 MG PO TABS: 325.0000 mg | ORAL_TABLET | Freq: Four times a day (QID) | ORAL | Status: DC | PRN

## 2019-07-09 MED ORDER — SUCCINYLCHOLINE CHLORIDE 200 MG/10ML IV SOSY
PREFILLED_SYRINGE | INTRAVENOUS | Status: DC | PRN
  Administered 2019-07-09: 120 mg via INTRAVENOUS

## 2019-07-09 MED ORDER — METHOCARBAMOL 500 MG IVPB - SIMPLE MED
INTRAVENOUS | Status: AC
  Administered 2019-07-09: 500 mg via INTRAVENOUS
  Filled 2019-07-09: qty 50

## 2019-07-09 MED ORDER — HYDROCHLOROTHIAZIDE 25 MG PO TABS
25.0000 mg | ORAL_TABLET | Freq: Every day | ORAL | Status: DC
  Administered 2019-07-09 – 2019-07-10 (×2): 25 mg via ORAL
  Filled 2019-07-09 (×2): qty 1

## 2019-07-09 MED ORDER — PHENOL 1.4 % MT LIQD: 1.0000 | OROMUCOSAL | Status: DC | PRN

## 2019-07-09 MED ORDER — FENTANYL CITRATE (PF) 100 MCG/2ML IJ SOLN
INTRAMUSCULAR | Status: AC
  Filled 2019-07-09: qty 2

## 2019-07-09 MED ORDER — BISACODYL 5 MG PO TBEC: 5.0000 mg | DELAYED_RELEASE_TABLET | Freq: Every day | ORAL | Status: DC | PRN

## 2019-07-09 MED ORDER — ONDANSETRON HCL 4 MG PO TABS: 4.0000 mg | ORAL_TABLET | Freq: Four times a day (QID) | ORAL | Status: DC | PRN

## 2019-07-09 MED ORDER — METOCLOPRAMIDE HCL 5 MG PO TABS: 5.0000 mg | ORAL_TABLET | Freq: Three times a day (TID) | ORAL | Status: DC | PRN

## 2019-07-09 MED ORDER — FENTANYL CITRATE (PF) 100 MCG/2ML IJ SOLN
INTRAMUSCULAR | Status: DC | PRN
  Administered 2019-07-09: 25 ug via INTRAVENOUS
  Administered 2019-07-09: 50 ug via INTRAVENOUS
  Administered 2019-07-09: 25 ug via INTRAVENOUS
  Administered 2019-07-09: 50 ug via INTRAVENOUS
  Administered 2019-07-09: 25 ug via INTRAVENOUS
  Administered 2019-07-09: 50 ug via INTRAVENOUS
  Administered 2019-07-09: 25 ug via INTRAVENOUS

## 2019-07-09 MED ORDER — ALUM & MAG HYDROXIDE-SIMETH 200-200-20 MG/5ML PO SUSP: 30.0000 mL | ORAL | Status: DC | PRN

## 2019-07-09 MED ORDER — CEFAZOLIN SODIUM-DEXTROSE 2-4 GM/100ML-% IV SOLN
2.0000 g | INTRAVENOUS | Status: AC
  Administered 2019-07-09: 2 g via INTRAVENOUS
  Filled 2019-07-09: qty 100

## 2019-07-09 MED ORDER — ONDANSETRON HCL 4 MG/2ML IJ SOLN
INTRAMUSCULAR | Status: DC | PRN
  Administered 2019-07-09: 4 mg via INTRAVENOUS

## 2019-07-09 MED ORDER — LEVOTHYROXINE SODIUM 75 MCG PO TABS
75.0000 ug | ORAL_TABLET | Freq: Every day | ORAL | Status: DC
  Administered 2019-07-10: 75 ug via ORAL
  Filled 2019-07-09: qty 1

## 2019-07-09 MED ORDER — TRANEXAMIC ACID-NACL 1000-0.7 MG/100ML-% IV SOLN
1000.0000 mg | INTRAVENOUS | Status: AC
  Administered 2019-07-09: 1000 mg via INTRAVENOUS
  Filled 2019-07-09: qty 100

## 2019-07-09 MED ORDER — PHENYLEPHRINE 40 MCG/ML (10ML) SYRINGE FOR IV PUSH (FOR BLOOD PRESSURE SUPPORT)
PREFILLED_SYRINGE | INTRAVENOUS | Status: AC
  Filled 2019-07-09: qty 10

## 2019-07-09 MED ORDER — DEXAMETHASONE SODIUM PHOSPHATE 10 MG/ML IJ SOLN
INTRAMUSCULAR | Status: AC
  Filled 2019-07-09: qty 3

## 2019-07-09 MED ORDER — METHOCARBAMOL 500 MG PO TABS
500.0000 mg | ORAL_TABLET | Freq: Four times a day (QID) | ORAL | Status: DC | PRN
  Administered 2019-07-09 (×2): 500 mg via ORAL
  Filled 2019-07-09 (×2): qty 1

## 2019-07-09 MED ORDER — 0.9 % SODIUM CHLORIDE (POUR BTL) OPTIME
TOPICAL | Status: DC | PRN
  Administered 2019-07-09: 1000 mL

## 2019-07-09 MED ORDER — STERILE WATER FOR IRRIGATION IR SOLN
Status: DC | PRN
  Administered 2019-07-09: 2000 mL

## 2019-07-09 MED ORDER — MIDAZOLAM HCL 2 MG/2ML IJ SOLN
INTRAMUSCULAR | Status: AC
  Filled 2019-07-09: qty 2

## 2019-07-09 MED ORDER — MIDAZOLAM HCL 5 MG/5ML IJ SOLN
INTRAMUSCULAR | Status: DC | PRN
  Administered 2019-07-09: 2 mg via INTRAVENOUS

## 2019-07-09 MED ORDER — LIDOCAINE 2% (20 MG/ML) 5 ML SYRINGE
INTRAMUSCULAR | Status: AC
  Filled 2019-07-09: qty 5

## 2019-07-09 MED ORDER — MAGNESIUM CITRATE PO SOLN: 1.0000 | Freq: Once | ORAL | Status: DC | PRN

## 2019-07-09 MED ORDER — HYDROMORPHONE HCL 1 MG/ML IJ SOLN: 0.5000 mg | INTRAMUSCULAR | Status: DC | PRN

## 2019-07-09 MED ORDER — SUCCINYLCHOLINE CHLORIDE 200 MG/10ML IV SOSY
PREFILLED_SYRINGE | INTRAVENOUS | Status: AC
  Filled 2019-07-09: qty 10

## 2019-07-09 MED ORDER — KETOROLAC TROMETHAMINE 15 MG/ML IJ SOLN
7.5000 mg | Freq: Four times a day (QID) | INTRAMUSCULAR | Status: DC
  Administered 2019-07-09 – 2019-07-10 (×3): 7.5 mg via INTRAVENOUS
  Filled 2019-07-09 (×3): qty 1

## 2019-07-09 MED ORDER — VORTIOXETINE HBR 5 MG PO TABS
10.0000 mg | ORAL_TABLET | ORAL | Status: DC
  Administered 2019-07-10: 10 mg via ORAL
  Filled 2019-07-09: qty 2

## 2019-07-09 MED ORDER — DOCUSATE SODIUM 100 MG PO CAPS
100.0000 mg | ORAL_CAPSULE | Freq: Two times a day (BID) | ORAL | Status: DC
  Filled 2019-07-09 (×2): qty 1

## 2019-07-09 MED ORDER — ESMOLOL HCL 100 MG/10ML IV SOLN
INTRAVENOUS | Status: DC | PRN
  Administered 2019-07-09: 20 mg via INTRAVENOUS

## 2019-07-09 MED ORDER — DEXAMETHASONE SODIUM PHOSPHATE 10 MG/ML IJ SOLN
INTRAMUSCULAR | Status: DC | PRN
  Administered 2019-07-09: 10 mg via INTRAVENOUS

## 2019-07-09 MED ORDER — OXYCODONE HCL 5 MG PO TABS: 5.0000 mg | ORAL_TABLET | ORAL | Status: DC | PRN

## 2019-07-09 MED ORDER — SODIUM CHLORIDE 0.9 % IV SOLN
INTRAVENOUS | Status: DC | PRN
  Administered 2019-07-09: 40 ug/min via INTRAVENOUS

## 2019-07-09 MED ORDER — METHOCARBAMOL 500 MG IVPB - SIMPLE MED
500.0000 mg | Freq: Four times a day (QID) | INTRAVENOUS | Status: DC | PRN
  Administered 2019-07-09: 500 mg via INTRAVENOUS
  Filled 2019-07-09: qty 50

## 2019-07-09 MED ORDER — PROPOFOL 10 MG/ML IV BOLUS
INTRAVENOUS | Status: DC | PRN
  Administered 2019-07-09: 150 mg via INTRAVENOUS

## 2019-07-09 MED ORDER — PANTOPRAZOLE SODIUM 40 MG PO TBEC
40.0000 mg | DELAYED_RELEASE_TABLET | Freq: Every day | ORAL | Status: DC
  Administered 2019-07-10: 40 mg via ORAL
  Filled 2019-07-09: qty 1

## 2019-07-09 MED ORDER — OXYCODONE HCL 5 MG PO TABS
10.0000 mg | ORAL_TABLET | ORAL | Status: DC | PRN
  Administered 2019-07-09 – 2019-07-10 (×5): 10 mg via ORAL
  Filled 2019-07-09 (×5): qty 2

## 2019-07-09 MED ORDER — DIPHENHYDRAMINE HCL 12.5 MG/5ML PO ELIX: 12.5000 mg | ORAL_SOLUTION | ORAL | Status: DC | PRN

## 2019-07-09 MED ORDER — ROCURONIUM BROMIDE 10 MG/ML (PF) SYRINGE
PREFILLED_SYRINGE | INTRAVENOUS | Status: DC | PRN
  Administered 2019-07-09: 15 mg via INTRAVENOUS
  Administered 2019-07-09: 30 mg via INTRAVENOUS
  Administered 2019-07-09: 10 mg via INTRAVENOUS
  Administered 2019-07-09: 15 mg via INTRAVENOUS

## 2019-07-09 MED ORDER — OLANZAPINE 5 MG PO TABS
5.0000 mg | ORAL_TABLET | Freq: Every day | ORAL | Status: DC
  Administered 2019-07-09: 5 mg via ORAL
  Filled 2019-07-09: qty 1

## 2019-07-09 MED ORDER — HYDROMORPHONE HCL 1 MG/ML IJ SOLN: 0.2500 mg | INTRAMUSCULAR | Status: DC | PRN

## 2019-07-09 MED ORDER — LACTATED RINGERS IV SOLN
INTRAVENOUS | Status: DC
  Administered 2019-07-09 – 2019-07-10 (×2): via INTRAVENOUS

## 2019-07-09 MED ORDER — LACTATED RINGERS IV SOLN
INTRAVENOUS | Status: DC
  Administered 2019-07-09 (×2): via INTRAVENOUS

## 2019-07-09 MED ORDER — SUGAMMADEX SODIUM 200 MG/2ML IV SOLN
INTRAVENOUS | Status: DC | PRN
  Administered 2019-07-09: 200 mg via INTRAVENOUS

## 2019-07-09 MED ORDER — ROCURONIUM BROMIDE 10 MG/ML (PF) SYRINGE
PREFILLED_SYRINGE | INTRAVENOUS | Status: AC
  Filled 2019-07-09: qty 10

## 2019-07-09 MED ORDER — CLONAZEPAM 1 MG PO TABS
1.0000 mg | ORAL_TABLET | Freq: Every day | ORAL | Status: DC | PRN
  Administered 2019-07-09 – 2019-07-10 (×2): 1 mg via ORAL
  Filled 2019-07-09 (×2): qty 1

## 2019-07-09 MED ORDER — ONDANSETRON HCL 4 MG/2ML IJ SOLN: 4.0000 mg | Freq: Four times a day (QID) | INTRAMUSCULAR | Status: DC | PRN

## 2019-07-09 MED ORDER — METOCLOPRAMIDE HCL 5 MG/ML IJ SOLN: 5.0000 mg | Freq: Three times a day (TID) | INTRAMUSCULAR | Status: DC | PRN

## 2019-07-09 MED ORDER — CHLORHEXIDINE GLUCONATE 4 % EX LIQD: 60.0000 mL | Freq: Once | CUTANEOUS | Status: DC

## 2019-07-09 MED ORDER — LIDOCAINE 2% (20 MG/ML) 5 ML SYRINGE
INTRAMUSCULAR | Status: DC | PRN
  Administered 2019-07-09: 100 mg via INTRAVENOUS

## 2019-07-09 MED ORDER — FENTANYL CITRATE (PF) 100 MCG/2ML IJ SOLN: 25.0000 ug | INTRAMUSCULAR | Status: DC | PRN

## 2019-07-09 MED ORDER — PROPOFOL 10 MG/ML IV BOLUS
INTRAVENOUS | Status: AC
  Filled 2019-07-09: qty 60

## 2019-07-09 MED ORDER — EPHEDRINE 5 MG/ML INJ
INTRAVENOUS | Status: AC
  Filled 2019-07-09: qty 10

## 2019-07-09 MED ORDER — PHENYLEPHRINE 40 MCG/ML (10ML) SYRINGE FOR IV PUSH (FOR BLOOD PRESSURE SUPPORT)
PREFILLED_SYRINGE | INTRAVENOUS | Status: DC | PRN
  Administered 2019-07-09: 120 ug via INTRAVENOUS

## 2019-07-09 MED ORDER — ESMOLOL HCL 100 MG/10ML IV SOLN
INTRAVENOUS | Status: AC
  Filled 2019-07-09: qty 10

## 2019-07-09 MED ORDER — POLYETHYLENE GLYCOL 3350 17 G PO PACK: 17.0000 g | PACK | Freq: Every day | ORAL | Status: DC | PRN

## 2019-07-09 MED ORDER — MENTHOL 3 MG MT LOZG: 1.0000 | LOZENGE | OROMUCOSAL | Status: DC | PRN

## 2019-07-09 MED ORDER — ONDANSETRON HCL 4 MG/2ML IJ SOLN
INTRAMUSCULAR | Status: AC
  Filled 2019-07-09: qty 6

## 2019-07-09 SURGICAL SUPPLY — 62 items
ADH SKN CLS APL DERMABOND .7 (GAUZE/BANDAGES/DRESSINGS) ×1
AID PSTN UNV HD RSTRNT DISP (MISCELLANEOUS) ×1
BAG SPEC THK2 15X12 ZIP CLS (MISCELLANEOUS) ×1
BAG ZIPLOCK 12X15 (MISCELLANEOUS) ×2 IMPLANT
BLADE SAW SGTL 83.5X18.5 (BLADE) ×2 IMPLANT
BSPLAT GLND +2X24 MDLR (Joint) ×1 IMPLANT
COOLER ICEMAN CLASSIC (MISCELLANEOUS) ×2 IMPLANT
COVER SURGICAL LIGHT HANDLE (MISCELLANEOUS) ×2 IMPLANT
COVER WAND RF STERILE (DRAPES) IMPLANT
CUP SUT UNIV REVERS 36+2 RT (Cup) ×2 IMPLANT
DERMABOND ADVANCED (GAUZE/BANDAGES/DRESSINGS) ×1
DERMABOND ADVANCED .7 DNX12 (GAUZE/BANDAGES/DRESSINGS) ×1 IMPLANT
DRAPE INCISE IOBAN 66X45 STRL (DRAPES) IMPLANT
DRAPE ORTHO SPLIT 77X108 STRL (DRAPES) ×4
DRAPE SURG 17X11 SM STRL (DRAPES) ×2 IMPLANT
DRAPE SURG ORHT 6 SPLT 77X108 (DRAPES) ×2 IMPLANT
DRAPE U-SHAPE 47X51 STRL (DRAPES) ×2 IMPLANT
DRSG AQUACEL AG ADV 3.5X10 (GAUZE/BANDAGES/DRESSINGS) ×2 IMPLANT
DURAPREP 26ML APPLICATOR (WOUND CARE) ×2 IMPLANT
ELECT BLADE TIP CTD 4 INCH (ELECTRODE) ×2 IMPLANT
ELECT REM PT RETURN 15FT ADLT (MISCELLANEOUS) ×2 IMPLANT
FACESHIELD WRAPAROUND (MASK) ×8 IMPLANT
GLENOID UNI REV MOD 24 +2 LAT (Joint) ×2 IMPLANT
GLENOSPHERE 36 +4 LAT/24 (Joint) ×2 IMPLANT
GLOVE BIO SURGEON STRL SZ7.5 (GLOVE) ×2 IMPLANT
GLOVE BIO SURGEON STRL SZ8 (GLOVE) ×2 IMPLANT
GLOVE SS BIOGEL STRL SZ 7 (GLOVE) ×1 IMPLANT
GLOVE SS BIOGEL STRL SZ 7.5 (GLOVE) ×1 IMPLANT
GLOVE SUPERSENSE BIOGEL SZ 7 (GLOVE) ×1
GLOVE SUPERSENSE BIOGEL SZ 7.5 (GLOVE) ×1
GOWN STRL REUS W/TWL LRG LVL3 (GOWN DISPOSABLE) ×4 IMPLANT
INSERT HUMERAL 36 +6 (Shoulder) ×2 IMPLANT
KIT BASIN OR (CUSTOM PROCEDURE TRAY) ×2 IMPLANT
KIT TURNOVER KIT A (KITS) IMPLANT
MANIFOLD NEPTUNE II (INSTRUMENTS) ×2 IMPLANT
NEEDLE TAPERED W/ NITINOL LOOP (MISCELLANEOUS) ×2 IMPLANT
NS IRRIG 1000ML POUR BTL (IV SOLUTION) ×2 IMPLANT
PACK SHOULDER (CUSTOM PROCEDURE TRAY) ×2 IMPLANT
PAD ARMBOARD 7.5X6 YLW CONV (MISCELLANEOUS) ×2 IMPLANT
PAD COLD SHLDR WRAP-ON (PAD) ×2 IMPLANT
PIN SET MODULAR GLENOID SYSTEM (PIN) ×2 IMPLANT
RESTRAINT HEAD UNIVERSAL NS (MISCELLANEOUS) ×2 IMPLANT
SCREW CENTRAL MOD 30MM (Screw) ×2 IMPLANT
SCREW PERI LOCK 5.5X16 (Screw) ×4 IMPLANT
SCREW PERI LOCK 5.5X36 (Screw) ×2 IMPLANT
SCREW PERIPHERAL 5.5X20 LOCK (Screw) ×2 IMPLANT
SLING ARM FOAM STRAP LRG (SOFTGOODS) ×2 IMPLANT
SLING ARM FOAM STRAP MED (SOFTGOODS) IMPLANT
SPONGE LAP 18X18 RF (DISPOSABLE) IMPLANT
STEM HUMERAL UNI REVERS SZ6 (Stem) ×2 IMPLANT
SUCTION FRAZIER HANDLE 12FR (TUBING) ×1
SUCTION TUBE FRAZIER 12FR DISP (TUBING) ×1 IMPLANT
SUT FIBERWIRE #2 38 T-5 BLUE (SUTURE) ×2
SUT MNCRL AB 3-0 PS2 18 (SUTURE) ×2 IMPLANT
SUT MON AB 2-0 CT1 36 (SUTURE) ×2 IMPLANT
SUT VIC AB 1 CT1 36 (SUTURE) ×2 IMPLANT
SUTURE FIBERWR #2 38 T-5 BLUE (SUTURE) ×1 IMPLANT
SUTURE TAPE 1.3 40 TPR END (SUTURE) ×2 IMPLANT
SUTURETAPE 1.3 40 TPR END (SUTURE) ×4
TOWEL OR 17X26 10 PK STRL BLUE (TOWEL DISPOSABLE) ×2 IMPLANT
TOWEL OR NON WOVEN STRL DISP B (DISPOSABLE) ×2 IMPLANT
WATER STERILE IRR 1000ML POUR (IV SOLUTION) ×4 IMPLANT

## 2019-07-09 NOTE — Anesthesia Postprocedure Evaluation (Signed)
Anesthesia Post Note  Patient: [name]  Procedure(s) Performed: REVERSE SHOULDER ARTHROPLASTY (Right Shoulder)     Patient location during evaluation: PACU Anesthesia Type: General Level of consciousness: awake Pain management: pain level controlled Vital Signs Assessment: post-procedure vital signs reviewed and stable Respiratory status: spontaneous breathing Cardiovascular status: stable Anesthetic complications: no    Last Vitals:  Vitals:   07/09/19 1000 07/09/19 1015  BP: 119/67 104/65  Pulse: 88 88  Resp: 13 12  Temp:    SpO2: 99% 98%    Last Pain:  Vitals:   07/09/19 1015  TempSrc:   PainSc: Asleep                 Gagan Dillion

## 2019-07-09 NOTE — Anesthesia Procedure Notes (Signed)
Procedure Name: Intubation Date/Time: 07/09/2019 7:44 AM Performed by: Silas Sacramento, CRNA Pre-anesthesia Checklist: Patient identified, Emergency Drugs available, Suction available and Patient being monitored Patient Re-evaluated:Patient Re-evaluated prior to induction Oxygen Delivery Method: Circle system utilized Preoxygenation: Pre-oxygenation with 100% oxygen Induction Type: IV induction and Rapid sequence Laryngoscope Size: Mac and 3 Grade View: Grade I Tube type: Oral Number of attempts: 1 Airway Equipment and Method: Stylet Placement Confirmation: ETT inserted through vocal cords under direct vision,  positive ETCO2 and breath sounds checked- equal and bilateral Secured at: 22 cm Tube secured with: Tape Dental Injury: Teeth and Oropharynx as per pre-operative assessment

## 2019-07-09 NOTE — Op Note (Signed)
07/09/2019  9:22 AM  PATIENT:   [name]  73 y.o. female  PRE-OPERATIVE DIAGNOSIS:  right shoulder end stage osteoarthritis  POST-OPERATIVE DIAGNOSIS: Same  PROCEDURE: Right shoulder reverse arthroplasty utilizing a press-fit size 6 Arthrex stem with a +6 polyethylene spacer, a 36/+4 glenosphere, and a small/+2 baseplate  SURGEON:  Esdras Delair, Metta Clines M.D.  ASSISTANTS: Jenetta Loges, PA-C  ANESTHESIA:   General endotracheal and interscalene block with Exparel  EBL: 200 cc  SPECIMEN: None  Drains: None   PATIENT DISPOSITION:  PACU - hemodynamically stable.    PLAN OF CARE: Admit for overnight observation  Brief history:  [name] is a 73 year old female who has had chronic and progressively increasing right shoulder pain related to end stage osteoarthritis as well as associated significant rotator cuff dysfunction.  Due to her increasing pain and functional limitation she is brought to the operating room at this time for planned right reverse shoulder arthroplasty  Preoperatively and counseled [name] regarding treatment options as well as the potential risks versus benefits thereof.  Possible surgical complications were reviewed including bleeding, infection, neurovascular injury, persistent pain, loss of motion, anesthetic complication, failure of the implant, and possible need for additional surgery.  She understands, and accepts, and agrees with our planned procedure.  Procedure in detail:  After undergoing routine preop evaluation patient received prophylactic antibiotics and interscalene block with Exparel was established in the holding area by the anesthesia department.  Placed supine on the operating table and underwent the smooth induction of a general endotracheal anesthesia.  Placed into the beachchair position and appropriately padded and protected.  The right shoulder girdle region was then sterilely prepped and draped in standard fashion.  Timeout  was called.  An anterior deltopectoral approach to the right shoulder was made through an approximate 10 cm incision.  Skin flaps are elevated dissection carried deeply and electrocautery used for hemostasis.  The deltopectoral interval was then developed from proximal to distal with a vein taken laterally in the conjoined tendon was mobilized and retracted medially.  She had previous arthroscopic surgery with rotator cuff repair and appeared as though the long head biceps tendon was scarified with the bicipital groove and so we simply unroofed the subscapularis performing a peel removing it from the lesser tuberosity and split the rotator cuff along the rotator interval and tagged the free margin of the subscapularis.  We then divided the capsular attachments from the anterior and inferior margins of the humeral neck allowing [place] to deliver the humeral head to the wound.  We outlined our proposed humeral head resection with the extra medullary guide and this was then performed with an oscillating saw with care taken to protect the rotator cuff posteriorly.  A metal cap was then placed over the cut proximal humeral surface and the peripheral osteophytes were then removed and the arm was then positioned to allow exposure of the glenoid using appropriate retractors.  At this point a circumferential labral resection was then completed gaining complete visualization of the periphery of the glenoid.  A guidepin was then placed into the center of the glenoid with a 10 degree inferior tilt and the glenoid was then reamed to a stable subchondral bony bed.  The peripheral reamer was then utilized to completely prepare the glenoid and our central lag screw hole was prepared reaming and tapping appropriately and our final baseplate with a 30 mm lag screw was then introduced into the glenoid obtaining excellent fit fixation.  All of  the peripheral locking screws were then placed with good fit and fixation.  A 36/+4 glenosphere  was then impacted onto the baseplate and the central locking screw was positioned with good fixation.  We then returned our attention to the proximal humerus where the canal was reamed by hand up to size 6 and broached to size 6 which showed excellent fit and fixation.  The metaphysis was then reamed with a posterior offset reaming guide and a trial implant was placed and trial reduction showed good fit and good soft tissue balance.  Our final implant was then assembled on the back table and the implant was then impacted with excellent fit fixation.  This point additional trial reductions were performed and we confirmed that a +6 polyethylene insert gave [place] the best soft tissue balance with excellent stability.  The final polyethylene insert was then impacted and our final reduction was then performed again very pleased with the overall shoulder motion as well as stability and soft tissue balance.  The wound was then copiously irrigated.  Hemostasis was obtained.  The subscapularis was then repaired back to the metaphysis utilizing eyelets on the collar of our implant using the previously placed suture tape.  Deltopectoral interval reapproximated with a series of figure-of-eight #1 Vicryl sutures.  2-0 Vicryl used for the subcu layer and a cuticular 3-0 Monocryl for the skin followed by Dermabond and Aquasol dressing right arm was placed in a sling and the patient was awakened, extubated, and taken to recovery room in stable condition.  Jenetta Loges, PA-C was used as an Environmental consultant throughout this case essential for help with positioning the patient, positioning extremity, tissue manipulation, implantation the prosthesis, wound closure, and intraoperative decision-making.  Metta Clines Brandelyn Henne MD   Contact # [phone]

## 2019-07-09 NOTE — Discharge Instructions (Signed)
° °Kevin M. Supple, M.D., F.A.A.O.S. °Orthopaedic Surgery °Specializing in Arthroscopic and Reconstructive °Surgery of the Shoulder °[phone] °[address] - [place], [place] [address] - Fax [phone] ° ° °POST-OP TOTAL SHOULDER REPLACEMENT INSTRUCTIONS ° °1. Call the office at [phone] to schedule your first post-op appointment 10-14 days from the date of your surgery. ° °2. The bandage over your incision is waterproof. You may begin showering with this dressing on. You may leave this dressing on until first follow up appointment within 2 weeks. We prefer you leave this dressing in place until follow up however after 5-7 days if you are having itching or skin irritation and would like to remove it you may do so. Go slow and tug at the borders gently to break the bond the dressing has with the skin. At this point if there is no drainage it is okay to go without a bandage or you may cover it with a light guaze and tape. You can also expect significant bruising around your shoulder that will drift down your arm and into your chest wall. This is very normal and should resolve over several days. ° ° 3. Wear your sling/immobilizer at all times except to perform the exercises below or to occasionally let your arm dangle by your side to stretch your elbow. You also need to sleep in your sling immobilizer until instructed otherwise. It is ok to remove your sling if you are sitting in a controlled environment and allow your arm to rest in a position of comfort by your side or on your lap with pillows to give your neck and skin a break from the sling. You may remove it to allow arm to dangle by side to shower. If you are up walking around and when you go to sleep at night you need to wear it. ° °4. Range of motion to your elbow, wrist, and hand are encouraged 3-5 times daily. Exercise to your hand and fingers helps to reduce swelling you may experience. ° °5. Utilize ice to the shoulder 3-5 times  minimum a day and additionally if you are experiencing pain. ° °6. Prescriptions for a pain medication and a muscle relaxant are provided for you. It is recommended that if you are experiencing pain that you pain medication alone is not controlling, add the muscle relaxant along with the pain medication which can give additional pain relief. The first 1-2 days is generally the most severe of your pain and then should gradually decrease. As your pain lessens it is recommended that you decrease your use of the pain medications to an "as needed basis'" only and to always comply with the recommended dosages of the pain medications. ° °7. Pain medications can produce constipation along with their use. If you experience this, the use of an over the counter stool softener or laxative daily is recommended.  ° °8. For additional questions or concerns, please do not hesitate to call the office. If after hours there is an answering service to forward your concerns to the physician on call. ° °9.Pain control following an exparel block ° °To help control your post-operative pain you received a nerve block  performed with Exparel which is a long acting anesthetic (numbing agent) which can provide pain relief and sensations of numbness (and relief of pain) in the operative shoulder and arm for up to 3 days. Sometimes it provides mixed relief, meaning you may still have numbness in certain areas of the arm but can still   be able to move  parts of that arm, hand, and fingers. We recommend that your prescribed pain medications  be used as needed. We do not feel it is necessary to "pre medicate" and "stay ahead" of pain.  Taking narcotic pain medications when you are not having any pain can lead to unnecessary and potentially dangerous side effects.    10. Use the ice machine as much as possible in the first 5-7 days from surgery, then you can wean its use to as needed. The ice typically needs to be replaced every 6 hours, instead of  ice you can actually freeze water bottles to put in the cooler and then fill water around them to avoid having to purchase ice. You can have spare water bottles freezing to allow you to rotate them once they have melted. Try to have a thin shirt or light cloth or towel under the ice wrap to protect your skin.  POST-OP EXERCISES  Pendulum Exercises  Perform pendulum exercises while standing and bending at the waist. Support your uninvolved arm on a table or chair and allow your operated arm to hang freely. Make sure to do these exercises passively - not using you shoulder muscles.  Repeat 20 times. Do 3 sessions per day.

## 2019-07-09 NOTE — H&P (Signed)
[name]    Chief Complaint: right shoulder osteoarthritis HPI: The patient is a 73 y.o. 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 Anxiety   . Arthritis    cerv & lumbar arthritis  . Cervical dystonia    In remission  . Chronic kidney disease    R cyst, obstruction- Dr. Lorrene Reid seen pt. 12/2017  . Colitis   . Depression    [name] - sees counsellor, no meds.  Marland Kitchen Dyspnea   . GERD (gastroesophageal reflux disease)   . Hypertension   . Neuromuscular disorder ([place])   . Sleep apnea    told that she has apnea - on sleep study, cannot tolerate   . 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 ABDOMINAL HYSTERECTOMY    . APPENDECTOMY    . CHOLECYSTECTOMY    . JOINT REPLACEMENT  2012   R knee   . KNEE SURGERY    . ROTATOR CUFF REPAIR Right 2016  . SPINAL CORD STIMULATOR BATTERY EXCHANGE N/A 08/20/2018   Procedure: SPINAL CORD STIMULATOR BATTERY EXCHANGE/REPLACE WITH RECHARGEABLE BATTERY;  Surgeon: Melina Schools, MD;  Location: [facility];  Service: Orthopedics;  Laterality: N/A;  . SPINAL CORD STIMULATOR INSERTION N/A 01/30/2018   Procedure: LUMBAR SPINAL CORD STIMULATOR INSERTION;  Surgeon: Melina Schools, MD;  Location: [facility];  Service: Orthopedics;  Laterality: N/A;  2.5 hrs  . TONSILLECTOMY      Family History  Problem Relation Age of Onset  . Alzheimer's disease Mother   . Heart disease Father     Social History:  reports that she quit smoking about 29 years ago. Her smoking use included cigarettes. She has never used smokeless tobacco. She reports that she does not drink alcohol or use drugs.   Medications Prior to Admission  Medication Sig Dispense Refill  . atorvastatin (LIPITOR) 40 MG tablet Take 40 mg by mouth at bedtime.     . clonazePAM (KLONOPIN) 1 MG tablet Take 1 mg by mouth daily as needed for anxiety.     . hydrochlorothiazide (HYDRODIURIL) 25 MG tablet Take 25 mg by mouth daily.    Marland Kitchen HYDROcodone-acetaminophen (NORCO) 10-325 MG tablet Take 1 tablet by mouth 5 (five) times daily as needed for severe pain.    Marland Kitchen levothyroxine (SYNTHROID, LEVOTHROID) 75 MCG tablet Take 75 mcg by mouth daily before breakfast.     . Melatonin 10 MG CAPS Take 10 mg by mouth at bedtime as needed (for sleep).    . OLANZapine (ZYPREXA) 5 MG tablet Take 5 mg by mouth at bedtime.     Marland Kitchen olmesartan (BENICAR) 40 MG tablet Take 40 mg by mouth at bedtime.     Marland Kitchen omeprazole (PRILOSEC) 40 MG capsule Take 40 mg by mouth at bedtime.     . raloxifene (EVISTA) 60 MG tablet Take 60 mg by mouth every evening.    . vortioxetine HBr (TRINTELLIX) 10 MG TABS tablet Take 10 mg by mouth every morning.    . zolpidem (AMBIEN) 10 MG tablet Take 10 mg by mouth at bedtime.       Physical Exam: right shoulder with painful and restricted motion as noted at recent office visits  Vitals  Temp:  [98.2 F (36.8 C)] 98.2 F (36.8 C) (07/23 0609) Pulse Rate:  [77] 77 (07/23 0609) Resp:  [18] 18 (07/23 0609) BP: (  142)/(95) 142/95 (07/23 0609) SpO2:  [95 %] 95 % (07/23 0609)  Assessment/Plan  Impression: right shoulder osteoarthritis  Plan of Action: Procedure(s): REVERSE SHOULDER ARTHROPLASTY  Alfons Sulkowski M Dillan Candela 07/09/2019, 6:55 AM Contact # [phone]

## 2019-07-09 NOTE — Transfer of Care (Addendum)
Immediate Anesthesia Transfer of Care Note  Patient: [name]  Procedure(s) Performed: REVERSE SHOULDER ARTHROPLASTY (Right Shoulder)  Patient Location: PACU  Anesthesia Type:General  Level of Consciousness: drowsy and patient cooperative  Airway & Oxygen Therapy: Patient Spontanous Breathing and Patient connected to face mask oxygen  Post-op Assessment: Report given to RN and Post -op Vital signs reviewed and stable  Post vital signs: Reviewed and stable  Last Vitals:  Vitals Value Taken Time  BP 132/93 07/09/19 0936  Temp    Pulse 94 07/09/19 0938  Resp 12 07/09/19 0938  SpO2 97 % 07/09/19 0938  Vitals shown include unvalidated device data.  Last Pain:  Vitals:   07/09/19 0609  TempSrc: Oral         Complications: No apparent anesthesia complications

## 2019-07-09 NOTE — Anesthesia Procedure Notes (Signed)
Anesthesia Regional Block: Interscalene brachial plexus block   Pre-Anesthetic Checklist: ,, timeout performed, Correct Patient, Correct Site, Correct Laterality, Correct Position, site marked, risks and benefits discussed, surgical consent, pre-op evaluation,  At surgeon's request and post-op pain management  Laterality: Right  Prep: chloraprep       Needles:  Injection technique: Single-shot  Needle Type: Stimulator Needle - 40          Additional Needles:   Procedures:, nerve stimulator,,, ultrasound used (permanent image in chart),,,,   Nerve Stimulator or Paresthesia:  Response: 0.5 mA,   Additional Responses:   Narrative:  Start time: 07/09/2019 7:00 AM End time: 07/09/2019 7:15 AM Injection made incrementally with aspirations every 5 mL.  Performed by: Personally  Anesthesiologist: Belinda Block, MD

## 2019-07-10 ENCOUNTER — Encounter (HOSPITAL_COMMUNITY): Payer: Self-pay | Admitting: Orthopedic Surgery

## 2019-07-10 MED ORDER — OXYCODONE-ACETAMINOPHEN 5-325 MG PO TABS: 1.0000 | ORAL_TABLET | ORAL | 0 refills | Status: DC | PRN

## 2019-07-10 MED ORDER — ONDANSETRON HCL 4 MG PO TABS: 4.0000 mg | ORAL_TABLET | Freq: Three times a day (TID) | ORAL | 0 refills | Status: AC | PRN

## 2019-07-10 MED ORDER — CYCLOBENZAPRINE HCL 10 MG PO TABS: 10.0000 mg | ORAL_TABLET | Freq: Three times a day (TID) | ORAL | 1 refills | Status: AC | PRN

## 2019-07-10 NOTE — Discharge Summary (Signed)
PATIENT ID:      [name]  MRN:     [id number] DOB/AGE:    08-26-1946 / 73 y.o.     DISCHARGE SUMMARY  ADMISSION DATE:    07/09/2019 DISCHARGE DATE:    ADMISSION DIAGNOSIS: right shoulder osteoarthritis Past Medical History:  Diagnosis Date  . Anxiety   . Arthritis    cerv & lumbar arthritis  . Cervical dystonia    In remission  . Chronic kidney disease    R cyst, obstruction- Dr. Lorrene Reid seen pt. 12/2017  . Colitis   . Depression    [name] - sees counsellor, no meds.  Marland Kitchen Dyspnea   . GERD (gastroesophageal reflux disease)   . Hypertension   . Neuromuscular disorder ([place])   . Sleep apnea    told that she has apnea - on sleep study, cannot tolerate   . 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  HOSPITAL COURSE:  [name] is a 73 y.o. admitted on 07/09/2019 with a diagnosis of right shoulder osteoarthritis.  They were brought to the operating room on 07/09/2019 and underwent Procedure(s): [place].    They were given perioperative antibiotics:  Anti-infectives (From admission, onward)   Start     Dose/Rate Route Frequency Ordered Stop   07/09/19 0600  ceFAZolin (ANCEF) IVPB 2g/100 mL premix     2 g 200 mL/hr over 30 Minutes Intravenous On call to O.R. 07/09/19 7829 07/09/19 0755    .  Patient underwent the above named procedure and tolerated it well. The following day they were hemodynamically stable and pain was controlled on oral analgesics. They were neurovascularly intact to the operative extremity. OT was ordered and worked with patient per protocol. They were medically and orthopaedically stable for discharge on .    DIAGNOSTIC STUDIES:  RECENT RADIOGRAPHIC STUDIES :  No results found.  RECENT VITAL  SIGNS:   Patient Vitals for the past 24 hrs:  BP Temp Temp src Pulse Resp SpO2 Height Weight  07/10/19 0416 130/84 97.9 F (36.6 C) Oral 99 14 97 % - -  07/10/19 0200 - - - 98 14 98 % - -  07/10/19 0114 (!) 153/77 97.6 F (36.4 C) Oral (!) 105 18 96 % - -  07/09/19 2204 (!) 142/75 98.3 F (36.8 C) Oral (!) 109 16 96 % - -  07/09/19 1601 (!) 171/89 (!) 97.5 F (36.4 C) Oral 93 16 97 % - -  07/09/19 1450 137/74 97.9 F (36.6 C) Oral 93 16 100 % - -  07/09/19 1423 - - - - - - 5\' 2"  (1.575 m) 87.1 kg  07/09/19 1352 (!) 152/87 - - 90 14 98 % - -  07/09/19 1245 119/66 97.9 F (36.6 C) - 89 13 99 % - -  07/09/19 1200 125/68 97.8 F (36.6 C) - 91 15 99 % - -  07/09/19 1145 98/60 - - 92 17 97 % - -  07/09/19 1130 122/73 - - 95 13 98 % - -  07/09/19 1115 114/70 - - 86 14 97 % - -  07/09/19 1100 104/67 - - 86 14 97 % - -  07/09/19 1030 109/61 - - 86 13 98 % - -  07/09/19 1015 104/65 - -  88 12 98 % - -  07/09/19 1000 119/67 - - 88 13 99 % - -  07/09/19 0945 (!) 119/50 - - 90 13 100 % - -  07/09/19 0936 (!) 132/93 97.7 F (36.5 C) - 93 11 98 % - -  .  RECENT EKG RESULTS:    Orders placed or performed during the hospital encounter of 07/07/19  . EKG 12-Lead  . EKG 12-Lead    DISCHARGE INSTRUCTIONS:    DISCHARGE MEDICATIONS:   Allergies as of 07/10/2019      Reactions   Botox [onabotulinumtoxina] Anaphylaxis   Sulfa Antibiotics Other (See Comments)   UNSPECIFIED REACTION of CHILDHOOD   Ciprofloxacin Other (See Comments)   Yeast infections.   Gabapentin Other (See Comments)   Changes in patient mood      Medication List    STOP taking these medications   HYDROcodone-acetaminophen 10-325 MG tablet Commonly known as: NORCO     TAKE these medications   atorvastatin 40 MG tablet Commonly known as: LIPITOR Take 40 mg by mouth at bedtime.   Benicar 40 MG tablet Generic drug: olmesartan Take 40 mg by mouth at bedtime.   clonazePAM 1 MG tablet Commonly known as:  KLONOPIN Take 1 mg by mouth daily as needed for anxiety.   cyclobenzaprine 10 MG tablet Commonly known as: FLEXERIL Take 1 tablet (10 mg total) by mouth 3 (three) times daily as needed for muscle spasms.   hydrochlorothiazide 25 MG tablet Commonly known as: HYDRODIURIL Take 25 mg by mouth daily.   levothyroxine 75 MCG tablet Commonly known as: SYNTHROID Take 75 mcg by mouth daily before breakfast.   Melatonin 10 MG Caps Take 10 mg by mouth at bedtime as needed (for sleep).   OLANZapine 5 MG tablet Commonly known as: ZYPREXA Take 5 mg by mouth at bedtime.   omeprazole 40 MG capsule Commonly known as: PRILOSEC Take 40 mg by mouth at bedtime.   ondansetron 4 MG tablet Commonly known as: Zofran Take 1 tablet (4 mg total) by mouth every 8 (eight) hours as needed for nausea or vomiting.   oxyCODONE-acetaminophen 5-325 MG tablet Commonly known as: Percocet Take 1 tablet by mouth every 4 (four) hours as needed (max 6 q).   raloxifene 60 MG tablet Commonly known as: EVISTA Take 60 mg by mouth every evening.   Trintellix 10 MG Tabs tablet Generic drug: vortioxetine HBr Take 10 mg by mouth every morning.   zolpidem 10 MG tablet Commonly known as: AMBIEN Take 10 mg by mouth at bedtime.       FOLLOW UP VISIT:   Follow-up Information    Justice Britain, MD.   Specialty: Orthopedic Surgery Why: call to be seen in 10-14days Contact information: [address] [place] [address] [place] [address] [phone]           DISCHARGE YY:TKPT   DISCHARGE CONDITION:  Thereasa Parkin Cecila Satcher for Dr. Justice Britain 07/10/2019, 9:34 AM

## 2019-07-10 NOTE — Progress Notes (Signed)
   07/10/19 1000  OT Visit Information  Last OT Received On 07/10/19  Assistance Needed +1  History of Present Illness s/p R TSA.  PMH:  HTN, anxiety, depression, cervical dystonia, arthritis in back  Precautions  Precautions Shoulder  Type of Shoulder Precautions may come of of sling in sitting with pillows behind arm and under forearm, may use arm for adls within parameters of 60 FF, 45 ABD and 20 ER.  May do pendulums and lap slides.  May move elbow wrist and fingers  Pain Assessment  Pain Assessment Faces  Faces Pain Scale 8  Pain Location back  Pain Descriptors / Indicators Aching  Pain Intervention(s) Limited activity within patient's tolerance;Monitored during session;Repositioned;Patient requesting pain meds-RN notified;Ice applied  Cognition  Arousal/Alertness Awake/alert  Behavior During Therapy WFL for tasks assessed/performed  Overall Cognitive Status Within Functional Limits for tasks assessed  ADL  Toilet Transfer Min guard;Ambulation;Comfort height toilet (cane)  Toileting- Water quality scientist and Hygiene Min guard;Sit to/from stand  General ADL Comments pt more steady with cane but still needs min guard for safety and also cues to give herself a lot of space with doorways.  She does not feel she needs to walk with PT and feels husband can assist her.  Reviewed protocol and exercises. Pt verbalizes understanding of all  General Comments  General comments (skin integrity, edema, etc.) sats 95% on RA; 02 off since last visit.  Exercises  Exercises Other exercises  Other Exercises  Other Exercises AAROM for elbow and supination as well as lap slides. Performed AAROM pendulums from sitting due to back issues  OT - End of Session  Activity Tolerance Patient tolerated treatment well  Patient left in bed;with call bell/phone within reach  OT Assessment/Plan  OT Visit Diagnosis Muscle weakness (generalized) (M62.81);Pain  Pain - Right/Left Right  Pain - part of body  Shoulder  Follow Up Recommendations Supervision/Assistance - 24 hour  OT Equipment None recommended by OT  AM-PAC OT "6 Clicks" Daily Activity Outcome Measure (Version 2)  Help from another person eating meals? 3  Help from another person taking care of personal grooming? 3  Help from another person toileting, which includes using toliet, bedpan, or urinal? 3  Help from another person bathing (including washing, rinsing, drying)? 2  Help from another person to put on and taking off regular upper body clothing? 2  Help from another person to put on and taking off regular lower body clothing? 2  6 Click Score 15  OT Goal Progression  Progress towards OT goals Goals met/education completed, patient discharged from OT  OT Time Calculation  OT Start Time (ACUTE ONLY) 1009  OT Stop Time (ACUTE ONLY) 1031  OT Time Calculation (min) 22 min  OT General Charges  $OT Visit 1 Visit  OT Treatments  $Therapeutic Exercise 8-22 mins  Lesle Chris, OTR/L Acute Rehabilitation Services [phone] [facility] pager [phone] office 07/10/2019

## 2019-07-10 NOTE — TOC Transition Note (Signed)
Transition of Care [facility]) - CM/SW Discharge Note   Patient Details  Name: TRENIYA LOBB MRN: [id number] Date of Birth: 1946-01-25  Transition of Care [facility]) CM/SW Contact:  Leeroy Cha, RN Phone Number: 07/10/2019, 10:15 AM   Clinical Narrative:    dcd to home   Final next level of care: Home/Self Care Barriers to Discharge: No Barriers Identified   Patient Goals and CMS Choice Patient states their goals for this hospitalization and ongoing recovery are:: to go home CMS Medicare.gov Compare Post Acute Care list provided to:: Patient    Discharge Placement                       Discharge Plan and Services                                     Social Determinants of Health (SDOH) Interventions     Readmission Risk Interventions No flowsheet data found.
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
## 2019-07-22 DIAGNOSIS — Z471 Aftercare following joint replacement surgery: Secondary | ICD-10-CM | POA: Diagnosis not present

## 2019-07-22 DIAGNOSIS — Z96611 Presence of right artificial shoulder joint: Secondary | ICD-10-CM | POA: Diagnosis not present

## 2019-07-28 DIAGNOSIS — G8929 Other chronic pain: Secondary | ICD-10-CM | POA: Diagnosis not present

## 2019-07-28 DIAGNOSIS — Z5181 Encounter for therapeutic drug level monitoring: Secondary | ICD-10-CM | POA: Diagnosis not present

## 2019-07-28 DIAGNOSIS — M48061 Spinal stenosis, lumbar region without neurogenic claudication: Secondary | ICD-10-CM | POA: Diagnosis not present

## 2019-07-28 DIAGNOSIS — M545 Low back pain: Secondary | ICD-10-CM | POA: Diagnosis not present

## 2019-07-28 DIAGNOSIS — F1190 Opioid use, unspecified, uncomplicated: Secondary | ICD-10-CM | POA: Diagnosis not present

## 2019-07-28 DIAGNOSIS — Z79899 Other long term (current) drug therapy: Secondary | ICD-10-CM | POA: Diagnosis not present

## 2019-07-28 DIAGNOSIS — G894 Chronic pain syndrome: Secondary | ICD-10-CM | POA: Diagnosis not present

## 2019-08-05 DIAGNOSIS — M25611 Stiffness of right shoulder, not elsewhere classified: Secondary | ICD-10-CM | POA: Diagnosis not present

## 2019-08-05 DIAGNOSIS — M25511 Pain in right shoulder: Secondary | ICD-10-CM | POA: Diagnosis not present

## 2019-08-14 DIAGNOSIS — M25511 Pain in right shoulder: Secondary | ICD-10-CM | POA: Diagnosis not present

## 2019-08-18 DIAGNOSIS — M25511 Pain in right shoulder: Secondary | ICD-10-CM | POA: Diagnosis not present

## 2019-08-18 DIAGNOSIS — M25611 Stiffness of right shoulder, not elsewhere classified: Secondary | ICD-10-CM | POA: Diagnosis not present

## 2019-08-19 DIAGNOSIS — M48061 Spinal stenosis, lumbar region without neurogenic claudication: Secondary | ICD-10-CM | POA: Diagnosis not present

## 2019-08-25 DIAGNOSIS — M25611 Stiffness of right shoulder, not elsewhere classified: Secondary | ICD-10-CM | POA: Diagnosis not present

## 2019-08-25 DIAGNOSIS — M25511 Pain in right shoulder: Secondary | ICD-10-CM | POA: Diagnosis not present

## 2019-08-27 DIAGNOSIS — M25611 Stiffness of right shoulder, not elsewhere classified: Secondary | ICD-10-CM | POA: Diagnosis not present

## 2019-08-27 DIAGNOSIS — M25511 Pain in right shoulder: Secondary | ICD-10-CM | POA: Diagnosis not present

## 2019-08-31 DIAGNOSIS — H25813 Combined forms of age-related cataract, bilateral: Secondary | ICD-10-CM | POA: Diagnosis not present

## 2019-08-31 DIAGNOSIS — H47022 Hemorrhage in optic nerve sheath, left eye: Secondary | ICD-10-CM | POA: Diagnosis not present

## 2019-09-08 DIAGNOSIS — M5136 Other intervertebral disc degeneration, lumbar region: Secondary | ICD-10-CM | POA: Diagnosis not present

## 2019-09-30 DIAGNOSIS — Z96611 Presence of right artificial shoulder joint: Secondary | ICD-10-CM | POA: Diagnosis not present

## 2019-09-30 DIAGNOSIS — Z471 Aftercare following joint replacement surgery: Secondary | ICD-10-CM | POA: Diagnosis not present

## 2019-11-09 DIAGNOSIS — Z96611 Presence of right artificial shoulder joint: Secondary | ICD-10-CM | POA: Diagnosis not present

## 2019-11-09 DIAGNOSIS — Z471 Aftercare following joint replacement surgery: Secondary | ICD-10-CM | POA: Diagnosis not present

## 2019-11-10 DIAGNOSIS — M5136 Other intervertebral disc degeneration, lumbar region: Secondary | ICD-10-CM | POA: Diagnosis not present

## 2019-11-10 DIAGNOSIS — Z9689 Presence of other specified functional implants: Secondary | ICD-10-CM | POA: Diagnosis not present

## 2019-11-10 DIAGNOSIS — G894 Chronic pain syndrome: Secondary | ICD-10-CM | POA: Diagnosis not present

## 2019-11-10 DIAGNOSIS — Z79899 Other long term (current) drug therapy: Secondary | ICD-10-CM | POA: Diagnosis not present

## 2019-11-10 DIAGNOSIS — M545 Low back pain: Secondary | ICD-10-CM | POA: Diagnosis not present

## 2019-11-10 DIAGNOSIS — Z5181 Encounter for therapeutic drug level monitoring: Secondary | ICD-10-CM | POA: Diagnosis not present

## 2019-11-30 DIAGNOSIS — N183 Chronic kidney disease, stage 3 unspecified: Secondary | ICD-10-CM | POA: Diagnosis not present

## 2019-11-30 DIAGNOSIS — E039 Hypothyroidism, unspecified: Secondary | ICD-10-CM | POA: Diagnosis not present

## 2019-11-30 DIAGNOSIS — R7301 Impaired fasting glucose: Secondary | ICD-10-CM | POA: Diagnosis not present

## 2019-11-30 DIAGNOSIS — I129 Hypertensive chronic kidney disease with stage 1 through stage 4 chronic kidney disease, or unspecified chronic kidney disease: Secondary | ICD-10-CM | POA: Diagnosis not present

## 2019-12-01 ENCOUNTER — Other Ambulatory Visit: Payer: Self-pay | Admitting: Family Medicine

## 2019-12-01 DIAGNOSIS — M797 Fibromyalgia: Secondary | ICD-10-CM | POA: Diagnosis not present

## 2019-12-01 DIAGNOSIS — E039 Hypothyroidism, unspecified: Secondary | ICD-10-CM | POA: Diagnosis not present

## 2019-12-01 DIAGNOSIS — E669 Obesity, unspecified: Secondary | ICD-10-CM | POA: Diagnosis not present

## 2019-12-01 DIAGNOSIS — E782 Mixed hyperlipidemia: Secondary | ICD-10-CM | POA: Diagnosis not present

## 2019-12-01 DIAGNOSIS — R7301 Impaired fasting glucose: Secondary | ICD-10-CM | POA: Diagnosis not present

## 2019-12-01 DIAGNOSIS — Z1231 Encounter for screening mammogram for malignant neoplasm of breast: Secondary | ICD-10-CM

## 2019-12-01 DIAGNOSIS — I129 Hypertensive chronic kidney disease with stage 1 through stage 4 chronic kidney disease, or unspecified chronic kidney disease: Secondary | ICD-10-CM | POA: Diagnosis not present

## 2019-12-01 DIAGNOSIS — F322 Major depressive disorder, single episode, severe without psychotic features: Secondary | ICD-10-CM | POA: Diagnosis not present

## 2019-12-01 DIAGNOSIS — N183 Chronic kidney disease, stage 3 unspecified: Secondary | ICD-10-CM | POA: Diagnosis not present

## 2019-12-01 DIAGNOSIS — M8580 Other specified disorders of bone density and structure, unspecified site: Secondary | ICD-10-CM

## 2019-12-01 DIAGNOSIS — Z0000 Encounter for general adult medical examination without abnormal findings: Secondary | ICD-10-CM | POA: Diagnosis not present

## 2019-12-08 DIAGNOSIS — Z9689 Presence of other specified functional implants: Secondary | ICD-10-CM | POA: Diagnosis not present

## 2019-12-08 DIAGNOSIS — G894 Chronic pain syndrome: Secondary | ICD-10-CM | POA: Diagnosis not present

## 2019-12-08 DIAGNOSIS — M545 Low back pain: Secondary | ICD-10-CM | POA: Diagnosis not present

## 2019-12-08 DIAGNOSIS — T85192D Other mechanical complication of implanted electronic neurostimulator (electrode) of spinal cord, subsequent encounter: Secondary | ICD-10-CM | POA: Diagnosis not present

## 2019-12-15 DIAGNOSIS — M5136 Other intervertebral disc degeneration, lumbar region: Secondary | ICD-10-CM | POA: Diagnosis not present

## 2019-12-22 DIAGNOSIS — Z79899 Other long term (current) drug therapy: Secondary | ICD-10-CM | POA: Diagnosis not present

## 2019-12-22 DIAGNOSIS — G894 Chronic pain syndrome: Secondary | ICD-10-CM | POA: Diagnosis not present

## 2019-12-22 DIAGNOSIS — Z5181 Encounter for therapeutic drug level monitoring: Secondary | ICD-10-CM | POA: Diagnosis not present

## 2019-12-29 DIAGNOSIS — F411 Generalized anxiety disorder: Secondary | ICD-10-CM | POA: Diagnosis not present

## 2019-12-29 DIAGNOSIS — F3176 Bipolar disorder, in full remission, most recent episode depressed: Secondary | ICD-10-CM | POA: Diagnosis not present

## 2019-12-29 DIAGNOSIS — F5101 Primary insomnia: Secondary | ICD-10-CM | POA: Diagnosis not present

## 2020-01-05 DIAGNOSIS — G894 Chronic pain syndrome: Secondary | ICD-10-CM | POA: Diagnosis not present

## 2020-01-05 DIAGNOSIS — Z9689 Presence of other specified functional implants: Secondary | ICD-10-CM | POA: Diagnosis not present

## 2020-01-05 DIAGNOSIS — T85192D Other mechanical complication of implanted electronic neurostimulator (electrode) of spinal cord, subsequent encounter: Secondary | ICD-10-CM | POA: Diagnosis not present

## 2020-01-05 DIAGNOSIS — M545 Low back pain: Secondary | ICD-10-CM | POA: Diagnosis not present

## 2020-01-13 ENCOUNTER — Ambulatory Visit: Payer: Medicare HMO

## 2020-01-22 ENCOUNTER — Ambulatory Visit: Payer: Medicare HMO | Attending: Internal Medicine

## 2020-01-22 DIAGNOSIS — Z23 Encounter for immunization: Secondary | ICD-10-CM

## 2020-01-22 NOTE — Progress Notes (Signed)
   Covid-19 Vaccination Clinic  Name:  [name]    MRN: [id number] DOB: 1946/06/23  01/22/2020  [name] was observed post Covid-19 immunization for 15 minutes without incidence. She was provided with Vaccine Information Sheet and instruction to access the V-Safe system.   [name] was instructed to call 911 with any severe reactions post vaccine: Marland Kitchen Difficulty breathing  . Swelling of your face and throat  . A fast heartbeat  . A bad rash all over your body  . Dizziness and weakness    Immunizations Administered    Name Date Dose VIS Date Route   Pfizer COVID-19 Vaccine 01/22/2020  1:04 PM 0.3 mL 11/27/2019 Intramuscular   Manufacturer: [place]   Lot: [id number]   [place]: [id number]

## 2020-01-24 ENCOUNTER — Ambulatory Visit: Payer: Medicare HMO

## 2020-01-25 DIAGNOSIS — Z01818 Encounter for other preprocedural examination: Secondary | ICD-10-CM | POA: Diagnosis not present

## 2020-01-28 DIAGNOSIS — G894 Chronic pain syndrome: Secondary | ICD-10-CM | POA: Diagnosis not present

## 2020-01-28 DIAGNOSIS — G8929 Other chronic pain: Secondary | ICD-10-CM | POA: Diagnosis not present

## 2020-01-28 DIAGNOSIS — N183 Chronic kidney disease, stage 3 unspecified: Secondary | ICD-10-CM | POA: Diagnosis not present

## 2020-01-28 DIAGNOSIS — T85193A Other mechanical complication of implanted electronic neurostimulator, generator, initial encounter: Secondary | ICD-10-CM | POA: Diagnosis not present

## 2020-01-28 DIAGNOSIS — E669 Obesity, unspecified: Secondary | ICD-10-CM | POA: Diagnosis not present

## 2020-01-28 DIAGNOSIS — I129 Hypertensive chronic kidney disease with stage 1 through stage 4 chronic kidney disease, or unspecified chronic kidney disease: Secondary | ICD-10-CM | POA: Diagnosis not present

## 2020-01-28 DIAGNOSIS — Z6834 Body mass index (BMI) 34.0-34.9, adult: Secondary | ICD-10-CM | POA: Diagnosis not present

## 2020-01-28 DIAGNOSIS — M5136 Other intervertebral disc degeneration, lumbar region: Secondary | ICD-10-CM | POA: Diagnosis not present

## 2020-01-28 DIAGNOSIS — G4730 Sleep apnea, unspecified: Secondary | ICD-10-CM | POA: Diagnosis not present

## 2020-01-28 DIAGNOSIS — M48061 Spinal stenosis, lumbar region without neurogenic claudication: Secondary | ICD-10-CM | POA: Diagnosis not present

## 2020-01-28 DIAGNOSIS — K219 Gastro-esophageal reflux disease without esophagitis: Secondary | ICD-10-CM | POA: Diagnosis not present

## 2020-01-28 DIAGNOSIS — I10 Essential (primary) hypertension: Secondary | ICD-10-CM | POA: Diagnosis not present

## 2020-01-28 DIAGNOSIS — E039 Hypothyroidism, unspecified: Secondary | ICD-10-CM | POA: Diagnosis not present

## 2020-01-28 DIAGNOSIS — M545 Low back pain: Secondary | ICD-10-CM | POA: Diagnosis not present

## 2020-01-28 DIAGNOSIS — G4733 Obstructive sleep apnea (adult) (pediatric): Secondary | ICD-10-CM | POA: Diagnosis not present

## 2020-01-28 DIAGNOSIS — T85118D Breakdown (mechanical) of other implanted electronic stimulator of nervous system, subsequent encounter: Secondary | ICD-10-CM | POA: Diagnosis not present

## 2020-01-28 DIAGNOSIS — M792 Neuralgia and neuritis, unspecified: Secondary | ICD-10-CM | POA: Diagnosis not present

## 2020-01-28 DIAGNOSIS — Z96651 Presence of right artificial knee joint: Secondary | ICD-10-CM | POA: Diagnosis not present

## 2020-02-04 DIAGNOSIS — M48061 Spinal stenosis, lumbar region without neurogenic claudication: Secondary | ICD-10-CM | POA: Diagnosis not present

## 2020-02-04 DIAGNOSIS — M545 Low back pain: Secondary | ICD-10-CM | POA: Diagnosis not present

## 2020-02-04 DIAGNOSIS — T85192D Other mechanical complication of implanted electronic neurostimulator (electrode) of spinal cord, subsequent encounter: Secondary | ICD-10-CM | POA: Diagnosis not present

## 2020-02-04 DIAGNOSIS — G894 Chronic pain syndrome: Secondary | ICD-10-CM | POA: Diagnosis not present

## 2020-02-16 ENCOUNTER — Ambulatory Visit: Payer: Medicare HMO | Attending: Internal Medicine

## 2020-02-16 DIAGNOSIS — Z23 Encounter for immunization: Secondary | ICD-10-CM | POA: Insufficient documentation

## 2020-02-16 NOTE — Progress Notes (Signed)
   Covid-19 Vaccination Clinic  Name:  [name]    MRN: [id number] DOB: 31-Aug-1946  02/16/2020  [name] was observed post Covid-19 immunization for 30 minutes based on pre-vaccination screening without incident. She was provided with Vaccine Information Sheet and instruction to access the V-Safe system.   [name] was instructed to call 911 with any severe reactions post vaccine: Marland Kitchen Difficulty breathing  . Swelling of face and throat  . A fast heartbeat  . A bad rash all over body  . Dizziness and weakness   Immunizations Administered    Name Date Dose VIS Date Route   Pfizer COVID-19 Vaccine 02/16/2020  1:19 AM 0.3 mL 11/27/2019 Intramuscular   Manufacturer: [place]   Lot: [id number]   [place]: [id number]

## 2020-02-29 DIAGNOSIS — I129 Hypertensive chronic kidney disease with stage 1 through stage 4 chronic kidney disease, or unspecified chronic kidney disease: Secondary | ICD-10-CM | POA: Diagnosis not present

## 2020-02-29 DIAGNOSIS — R7301 Impaired fasting glucose: Secondary | ICD-10-CM | POA: Diagnosis not present

## 2020-03-02 ENCOUNTER — Ambulatory Visit: Payer: Medicare HMO

## 2020-03-02 ENCOUNTER — Other Ambulatory Visit: Payer: Medicare HMO

## 2020-03-08 DIAGNOSIS — G8929 Other chronic pain: Secondary | ICD-10-CM | POA: Diagnosis not present

## 2020-03-29 DIAGNOSIS — F411 Generalized anxiety disorder: Secondary | ICD-10-CM | POA: Diagnosis not present

## 2020-03-29 DIAGNOSIS — F3176 Bipolar disorder, in full remission, most recent episode depressed: Secondary | ICD-10-CM | POA: Diagnosis not present

## 2020-03-29 DIAGNOSIS — F5101 Primary insomnia: Secondary | ICD-10-CM | POA: Diagnosis not present

## 2020-04-04 DIAGNOSIS — N1330 Unspecified hydronephrosis: Secondary | ICD-10-CM | POA: Diagnosis not present

## 2020-04-04 DIAGNOSIS — N1339 Other hydronephrosis: Secondary | ICD-10-CM | POA: Diagnosis not present

## 2020-04-12 DIAGNOSIS — N130 Hydronephrosis with ureteropelvic junction obstruction: Secondary | ICD-10-CM | POA: Diagnosis not present

## 2020-04-12 DIAGNOSIS — N270 Small kidney, unilateral: Secondary | ICD-10-CM | POA: Diagnosis not present

## 2020-04-18 DIAGNOSIS — R109 Unspecified abdominal pain: Secondary | ICD-10-CM | POA: Diagnosis not present

## 2020-04-18 DIAGNOSIS — Z9049 Acquired absence of other specified parts of digestive tract: Secondary | ICD-10-CM | POA: Diagnosis not present

## 2020-04-18 DIAGNOSIS — R197 Diarrhea, unspecified: Secondary | ICD-10-CM | POA: Diagnosis not present

## 2020-04-29 ENCOUNTER — Ambulatory Visit
Admission: RE | Admit: 2020-04-29 | Discharge: 2020-04-29 | Disposition: A | Discharge status: Home / Self Care | Payer: Medicare HMO | Source: Ambulatory Visit | Attending: Gastroenterology | Admitting: Gastroenterology

## 2020-04-29 ENCOUNTER — Other Ambulatory Visit: Payer: Self-pay | Admitting: Gastroenterology

## 2020-04-29 DIAGNOSIS — R109 Unspecified abdominal pain: Secondary | ICD-10-CM

## 2020-04-29 DIAGNOSIS — R197 Diarrhea, unspecified: Secondary | ICD-10-CM | POA: Diagnosis not present

## 2020-05-03 ENCOUNTER — Other Ambulatory Visit: Payer: Self-pay | Admitting: Gastroenterology

## 2020-05-03 DIAGNOSIS — M545 Low back pain: Secondary | ICD-10-CM | POA: Diagnosis not present

## 2020-05-03 DIAGNOSIS — Z9689 Presence of other specified functional implants: Secondary | ICD-10-CM | POA: Diagnosis not present

## 2020-05-03 DIAGNOSIS — G894 Chronic pain syndrome: Secondary | ICD-10-CM | POA: Diagnosis not present

## 2020-05-03 DIAGNOSIS — R935 Abnormal findings on diagnostic imaging of other abdominal regions, including retroperitoneum: Secondary | ICD-10-CM

## 2020-05-03 DIAGNOSIS — Z79899 Other long term (current) drug therapy: Secondary | ICD-10-CM | POA: Diagnosis not present

## 2020-05-03 DIAGNOSIS — M48061 Spinal stenosis, lumbar region without neurogenic claudication: Secondary | ICD-10-CM | POA: Diagnosis not present

## 2020-05-03 DIAGNOSIS — Z5181 Encounter for therapeutic drug level monitoring: Secondary | ICD-10-CM | POA: Diagnosis not present

## 2020-05-06 ENCOUNTER — Other Ambulatory Visit: Payer: Self-pay | Admitting: Gastroenterology

## 2020-05-06 ENCOUNTER — Ambulatory Visit
Admission: RE | Admit: 2020-05-06 | Discharge: 2020-05-06 | Disposition: A | Discharge status: Home / Self Care | Payer: Medicare HMO | Source: Ambulatory Visit | Attending: Gastroenterology | Admitting: Gastroenterology

## 2020-05-06 ENCOUNTER — Other Ambulatory Visit: Payer: Self-pay | Admitting: Surgery

## 2020-05-06 ENCOUNTER — Other Ambulatory Visit: Payer: Self-pay | Admitting: Dentistry

## 2020-05-06 DIAGNOSIS — R109 Unspecified abdominal pain: Secondary | ICD-10-CM

## 2020-05-19 DIAGNOSIS — R109 Unspecified abdominal pain: Secondary | ICD-10-CM | POA: Diagnosis not present

## 2020-05-19 DIAGNOSIS — K580 Irritable bowel syndrome with diarrhea: Secondary | ICD-10-CM | POA: Diagnosis not present

## 2020-05-19 DIAGNOSIS — Z9049 Acquired absence of other specified parts of digestive tract: Secondary | ICD-10-CM | POA: Diagnosis not present

## 2020-06-01 DIAGNOSIS — F322 Major depressive disorder, single episode, severe without psychotic features: Secondary | ICD-10-CM | POA: Diagnosis not present

## 2020-06-01 DIAGNOSIS — I129 Hypertensive chronic kidney disease with stage 1 through stage 4 chronic kidney disease, or unspecified chronic kidney disease: Secondary | ICD-10-CM | POA: Diagnosis not present

## 2020-06-01 DIAGNOSIS — R5383 Other fatigue: Secondary | ICD-10-CM | POA: Diagnosis not present

## 2020-06-01 DIAGNOSIS — N183 Chronic kidney disease, stage 3 unspecified: Secondary | ICD-10-CM | POA: Diagnosis not present

## 2020-06-01 DIAGNOSIS — E039 Hypothyroidism, unspecified: Secondary | ICD-10-CM | POA: Diagnosis not present

## 2020-06-01 DIAGNOSIS — R7301 Impaired fasting glucose: Secondary | ICD-10-CM | POA: Diagnosis not present

## 2020-06-01 DIAGNOSIS — E782 Mixed hyperlipidemia: Secondary | ICD-10-CM | POA: Diagnosis not present

## 2020-06-01 DIAGNOSIS — E669 Obesity, unspecified: Secondary | ICD-10-CM | POA: Diagnosis not present

## 2020-06-02 DIAGNOSIS — F411 Generalized anxiety disorder: Secondary | ICD-10-CM | POA: Diagnosis not present

## 2020-06-02 DIAGNOSIS — F5101 Primary insomnia: Secondary | ICD-10-CM | POA: Diagnosis not present

## 2020-06-02 DIAGNOSIS — F3176 Bipolar disorder, in full remission, most recent episode depressed: Secondary | ICD-10-CM | POA: Diagnosis not present

## 2020-06-28 DIAGNOSIS — M545 Low back pain: Secondary | ICD-10-CM | POA: Diagnosis not present

## 2020-06-28 DIAGNOSIS — Z9689 Presence of other specified functional implants: Secondary | ICD-10-CM | POA: Diagnosis not present

## 2020-06-28 DIAGNOSIS — G894 Chronic pain syndrome: Secondary | ICD-10-CM | POA: Diagnosis not present

## 2020-06-28 DIAGNOSIS — M48061 Spinal stenosis, lumbar region without neurogenic claudication: Secondary | ICD-10-CM | POA: Diagnosis not present

## 2020-06-29 DIAGNOSIS — F5101 Primary insomnia: Secondary | ICD-10-CM | POA: Diagnosis not present

## 2020-06-29 DIAGNOSIS — F411 Generalized anxiety disorder: Secondary | ICD-10-CM | POA: Diagnosis not present

## 2020-06-29 DIAGNOSIS — F3176 Bipolar disorder, in full remission, most recent episode depressed: Secondary | ICD-10-CM | POA: Diagnosis not present

## 2020-07-26 ENCOUNTER — Emergency Department (HOSPITAL_COMMUNITY)
Admission: EM | Admit: 2020-07-26 | Discharge: 2020-07-26 | Disposition: A | Discharge status: Home / Self Care | Payer: Medicare HMO | Attending: Emergency Medicine | Admitting: Emergency Medicine

## 2020-07-26 ENCOUNTER — Other Ambulatory Visit: Payer: Self-pay

## 2020-07-26 ENCOUNTER — Encounter (HOSPITAL_COMMUNITY): Payer: Self-pay | Admitting: Pharmacy Technician

## 2020-07-26 DIAGNOSIS — Z5321 Procedure and treatment not carried out due to patient leaving prior to being seen by health care provider: Secondary | ICD-10-CM | POA: Insufficient documentation

## 2020-07-26 DIAGNOSIS — R112 Nausea with vomiting, unspecified: Secondary | ICD-10-CM | POA: Insufficient documentation

## 2020-07-26 DIAGNOSIS — R197 Diarrhea, unspecified: Secondary | ICD-10-CM | POA: Diagnosis not present

## 2020-07-26 DIAGNOSIS — R4182 Altered mental status, unspecified: Secondary | ICD-10-CM | POA: Diagnosis not present

## 2020-07-26 DIAGNOSIS — R410 Disorientation, unspecified: Secondary | ICD-10-CM | POA: Diagnosis not present

## 2020-07-26 LAB — COMPREHENSIVE METABOLIC PANEL
ALT: 32 U/L (ref 0–44)
AST: 28 U/L (ref 15–41)
Albumin: 3.7 g/dL (ref 3.5–5.0)
Alkaline Phosphatase: 75 U/L (ref 38–126)
Anion gap: 14 (ref 5–15)
BUN: 18 mg/dL (ref 8–23)
CO2: 25 mmol/L (ref 22–32)
Calcium: 9.4 mg/dL (ref 8.9–10.3)
Chloride: 99 mmol/L (ref 98–111)
Creatinine, Ser: 1.45 mg/dL — ABNORMAL HIGH (ref 0.44–1.00)
GFR calc Af Amer: 41 mL/min — ABNORMAL LOW (ref >60)
GFR calc non Af Amer: 36 mL/min — ABNORMAL LOW (ref >60)
Glucose, Bld: 109 mg/dL — ABNORMAL HIGH (ref 70–99)
Potassium: 3.4 mmol/L — ABNORMAL LOW (ref 3.5–5.1)
Sodium: 138 mmol/L (ref 135–145)
Total Bilirubin: 0.3 mg/dL (ref 0.3–1.2)
Total Protein: 7.4 g/dL (ref 6.5–8.1)

## 2020-07-26 LAB — CBC
HCT: 40.6 % (ref 36.0–46.0)
Hemoglobin: 12.9 g/dL (ref 12.0–15.0)
MCH: 27.7 pg (ref 26.0–34.0)
MCHC: 31.8 g/dL (ref 30.0–36.0)
MCV: 87.3 fL (ref 80.0–100.0)
Platelets: 399 10*3/uL (ref 150–400)
RBC: 4.65 MIL/uL (ref 3.87–5.11)
RDW: 12.8 % (ref 11.5–15.5)
WBC: 7 10*3/uL (ref 4.0–10.5)
nRBC: 0 % (ref 0.0–0.2)

## 2020-07-26 LAB — LIPASE, BLOOD: Lipase: 34 U/L (ref 11–51)

## 2020-07-26 NOTE — ED Triage Notes (Signed)
Pt bib ems from [facility] physician with NVD X4 days. Husband states pt with some AMS onset today. Pt alert and oriented X4 with ems.  144/90 HR 84 96% RA CBG 109

## 2020-07-26 NOTE — ED Notes (Signed)
[phone] mike Karis would like to be called for updates or when pt is in a room

## 2020-08-04 DIAGNOSIS — M47816 Spondylosis without myelopathy or radiculopathy, lumbar region: Secondary | ICD-10-CM | POA: Diagnosis not present

## 2020-08-24 DIAGNOSIS — M5136 Other intervertebral disc degeneration, lumbar region: Secondary | ICD-10-CM | POA: Diagnosis not present

## 2020-08-24 DIAGNOSIS — G894 Chronic pain syndrome: Secondary | ICD-10-CM | POA: Diagnosis not present

## 2020-09-01 DIAGNOSIS — M47816 Spondylosis without myelopathy or radiculopathy, lumbar region: Secondary | ICD-10-CM | POA: Diagnosis not present

## 2020-09-01 DIAGNOSIS — Z79899 Other long term (current) drug therapy: Secondary | ICD-10-CM | POA: Diagnosis not present

## 2020-09-01 DIAGNOSIS — G894 Chronic pain syndrome: Secondary | ICD-10-CM | POA: Diagnosis not present

## 2020-09-01 DIAGNOSIS — M533 Sacrococcygeal disorders, not elsewhere classified: Secondary | ICD-10-CM | POA: Diagnosis not present

## 2020-09-01 DIAGNOSIS — Z5181 Encounter for therapeutic drug level monitoring: Secondary | ICD-10-CM | POA: Diagnosis not present

## 2020-09-01 DIAGNOSIS — Z9689 Presence of other specified functional implants: Secondary | ICD-10-CM | POA: Diagnosis not present

## 2020-09-08 DIAGNOSIS — M545 Low back pain: Secondary | ICD-10-CM | POA: Diagnosis not present

## 2020-09-12 DIAGNOSIS — M545 Low back pain: Secondary | ICD-10-CM | POA: Diagnosis not present

## 2020-09-14 DIAGNOSIS — M545 Low back pain: Secondary | ICD-10-CM | POA: Diagnosis not present

## 2020-09-15 DIAGNOSIS — G894 Chronic pain syndrome: Secondary | ICD-10-CM | POA: Diagnosis not present

## 2020-09-15 DIAGNOSIS — Z9689 Presence of other specified functional implants: Secondary | ICD-10-CM | POA: Diagnosis not present

## 2020-09-15 DIAGNOSIS — M533 Sacrococcygeal disorders, not elsewhere classified: Secondary | ICD-10-CM | POA: Diagnosis not present

## 2020-09-22 DIAGNOSIS — M545 Low back pain, unspecified: Secondary | ICD-10-CM | POA: Diagnosis not present

## 2020-09-28 DIAGNOSIS — F3176 Bipolar disorder, in full remission, most recent episode depressed: Secondary | ICD-10-CM | POA: Diagnosis not present

## 2020-09-28 DIAGNOSIS — F411 Generalized anxiety disorder: Secondary | ICD-10-CM | POA: Diagnosis not present

## 2020-09-28 DIAGNOSIS — F5101 Primary insomnia: Secondary | ICD-10-CM | POA: Diagnosis not present

## 2020-10-03 DIAGNOSIS — M545 Low back pain, unspecified: Secondary | ICD-10-CM | POA: Diagnosis not present

## 2020-10-11 DIAGNOSIS — H2513 Age-related nuclear cataract, bilateral: Secondary | ICD-10-CM | POA: Diagnosis not present

## 2020-10-11 DIAGNOSIS — H5203 Hypermetropia, bilateral: Secondary | ICD-10-CM | POA: Diagnosis not present

## 2020-10-14 DIAGNOSIS — Z9689 Presence of other specified functional implants: Secondary | ICD-10-CM | POA: Diagnosis not present

## 2020-10-14 DIAGNOSIS — G894 Chronic pain syndrome: Secondary | ICD-10-CM | POA: Diagnosis not present

## 2020-10-14 DIAGNOSIS — M533 Sacrococcygeal disorders, not elsewhere classified: Secondary | ICD-10-CM | POA: Diagnosis not present

## 2020-10-14 DIAGNOSIS — M47816 Spondylosis without myelopathy or radiculopathy, lumbar region: Secondary | ICD-10-CM | POA: Diagnosis not present

## 2020-11-21 DIAGNOSIS — G894 Chronic pain syndrome: Secondary | ICD-10-CM | POA: Diagnosis not present

## 2020-11-21 DIAGNOSIS — Z79891 Long term (current) use of opiate analgesic: Secondary | ICD-10-CM | POA: Diagnosis not present

## 2020-11-21 DIAGNOSIS — M5136 Other intervertebral disc degeneration, lumbar region: Secondary | ICD-10-CM | POA: Diagnosis not present

## 2020-12-23 DIAGNOSIS — N1330 Unspecified hydronephrosis: Secondary | ICD-10-CM | POA: Diagnosis not present

## 2020-12-23 DIAGNOSIS — F322 Major depressive disorder, single episode, severe without psychotic features: Secondary | ICD-10-CM | POA: Diagnosis not present

## 2020-12-23 DIAGNOSIS — R7301 Impaired fasting glucose: Secondary | ICD-10-CM | POA: Diagnosis not present

## 2020-12-23 DIAGNOSIS — G4733 Obstructive sleep apnea (adult) (pediatric): Secondary | ICD-10-CM | POA: Diagnosis not present

## 2020-12-23 DIAGNOSIS — I129 Hypertensive chronic kidney disease with stage 1 through stage 4 chronic kidney disease, or unspecified chronic kidney disease: Secondary | ICD-10-CM | POA: Diagnosis not present

## 2020-12-23 DIAGNOSIS — E039 Hypothyroidism, unspecified: Secondary | ICD-10-CM | POA: Diagnosis not present

## 2020-12-23 DIAGNOSIS — R82998 Other abnormal findings in urine: Secondary | ICD-10-CM | POA: Diagnosis not present

## 2020-12-23 DIAGNOSIS — R109 Unspecified abdominal pain: Secondary | ICD-10-CM | POA: Diagnosis not present

## 2020-12-23 DIAGNOSIS — E782 Mixed hyperlipidemia: Secondary | ICD-10-CM | POA: Diagnosis not present

## 2020-12-23 DIAGNOSIS — E669 Obesity, unspecified: Secondary | ICD-10-CM | POA: Diagnosis not present

## 2020-12-23 DIAGNOSIS — N183 Chronic kidney disease, stage 3 unspecified: Secondary | ICD-10-CM | POA: Diagnosis not present

## 2020-12-23 DIAGNOSIS — Z0000 Encounter for general adult medical examination without abnormal findings: Secondary | ICD-10-CM | POA: Diagnosis not present

## 2020-12-27 DIAGNOSIS — F411 Generalized anxiety disorder: Secondary | ICD-10-CM | POA: Diagnosis not present

## 2020-12-27 DIAGNOSIS — F5105 Insomnia due to other mental disorder: Secondary | ICD-10-CM | POA: Diagnosis not present

## 2020-12-27 DIAGNOSIS — F3132 Bipolar disorder, current episode depressed, moderate: Secondary | ICD-10-CM | POA: Diagnosis not present

## 2020-12-29 DIAGNOSIS — N1339 Other hydronephrosis: Secondary | ICD-10-CM | POA: Diagnosis not present

## 2020-12-29 DIAGNOSIS — R8271 Bacteriuria: Secondary | ICD-10-CM | POA: Diagnosis not present

## 2020-12-29 DIAGNOSIS — N270 Small kidney, unilateral: Secondary | ICD-10-CM | POA: Diagnosis not present

## 2020-12-29 DIAGNOSIS — M549 Dorsalgia, unspecified: Secondary | ICD-10-CM | POA: Diagnosis not present

## 2021-01-10 ENCOUNTER — Other Ambulatory Visit: Payer: Self-pay | Admitting: Nurse Practitioner

## 2021-01-10 ENCOUNTER — Other Ambulatory Visit (HOSPITAL_COMMUNITY): Payer: Self-pay | Admitting: Nurse Practitioner

## 2021-01-10 DIAGNOSIS — N270 Small kidney, unilateral: Secondary | ICD-10-CM

## 2021-01-10 DIAGNOSIS — N179 Acute kidney failure, unspecified: Secondary | ICD-10-CM | POA: Diagnosis not present

## 2021-01-10 DIAGNOSIS — N1339 Other hydronephrosis: Secondary | ICD-10-CM

## 2021-01-10 DIAGNOSIS — N390 Urinary tract infection, site not specified: Secondary | ICD-10-CM | POA: Diagnosis not present

## 2021-01-12 ENCOUNTER — Ambulatory Visit: Payer: Medicare HMO | Admitting: Cardiology

## 2021-01-17 ENCOUNTER — Encounter (HOSPITAL_COMMUNITY)
Admission: RE | Admit: 2021-01-17 | Discharge: 2021-01-17 | Disposition: A | Discharge status: Home / Self Care | Payer: Medicare HMO | Source: Ambulatory Visit | Attending: Nurse Practitioner | Admitting: Nurse Practitioner

## 2021-01-17 ENCOUNTER — Other Ambulatory Visit: Payer: Self-pay

## 2021-01-17 DIAGNOSIS — N270 Small kidney, unilateral: Secondary | ICD-10-CM | POA: Diagnosis not present

## 2021-01-17 DIAGNOSIS — N1339 Other hydronephrosis: Secondary | ICD-10-CM | POA: Insufficient documentation

## 2021-01-17 DIAGNOSIS — N1330 Unspecified hydronephrosis: Secondary | ICD-10-CM | POA: Diagnosis not present

## 2021-01-17 MED ORDER — FUROSEMIDE 10 MG/ML IJ SOLN
INTRAMUSCULAR | Status: AC
  Filled 2021-01-17: qty 8

## 2021-01-17 MED ORDER — TECHNETIUM TC 99M MERTIATIDE: 5.0000 | Freq: Once | INTRAVENOUS | Status: DC | PRN

## 2021-01-17 MED ORDER — FUROSEMIDE 10 MG/ML IJ SOLN
44.0000 mg | Freq: Once | INTRAMUSCULAR | Status: AC
  Administered 2021-01-17: 44 mg via INTRAVENOUS

## 2021-01-18 DIAGNOSIS — N1339 Other hydronephrosis: Secondary | ICD-10-CM | POA: Diagnosis not present

## 2021-01-18 DIAGNOSIS — N270 Small kidney, unilateral: Secondary | ICD-10-CM | POA: Diagnosis not present

## 2021-01-30 ENCOUNTER — Other Ambulatory Visit: Payer: Self-pay

## 2021-01-30 ENCOUNTER — Ambulatory Visit: Payer: Medicare HMO | Admitting: Cardiology

## 2021-01-30 ENCOUNTER — Encounter: Payer: Self-pay | Admitting: Cardiology

## 2021-01-30 ENCOUNTER — Encounter: Payer: Self-pay | Admitting: *Deleted

## 2021-01-30 VITALS — BP 106/76 | HR 88 | Ht 62.0 in | Wt 171.0 lb

## 2021-01-30 DIAGNOSIS — Z01810 Encounter for preprocedural cardiovascular examination: Secondary | ICD-10-CM

## 2021-01-30 DIAGNOSIS — R072 Precordial pain: Secondary | ICD-10-CM

## 2021-01-30 DIAGNOSIS — I700 Atherosclerosis of aorta: Secondary | ICD-10-CM | POA: Diagnosis not present

## 2021-01-30 NOTE — Addendum Note (Signed)
Addended by: Jerline Pain on: 01/30/2021 04:31 PM   Modules accepted: Orders

## 2021-01-30 NOTE — Progress Notes (Signed)
Cardiology Office Note:    Date:  01/30/2021   ID:  [name], DOB 07/24/1946, MRN [id number]  PCP:  [name], MD   [place]  Cardiologist:  Candee Furbish, MD  Advanced Practice Provider:  No care team member to display Electrophysiologist:  None       Referring MD: [name], MD     History of Present Illness:    [name] is a 75 y.o. female former patient of Dr. Wynonia Lawman here for evaluation of chest pain.  She has had centralized chest pain while sitting down that radiates to her inside of her left arm lasting about 5 minutes duration, no significant inciting factors.  With time it dissipates.  This has been happening maybe once per week.  Does not recall this happening in the distant past.  She has chronic back pain and blood pressure was low at her last visit with Dr. Brigitte Pulse.  Her fatigue is accompanied at times with nausea and vomiting in the past.  Abdominal cramping.  KUB had been performed which showed possible gastroparesis.  CT of abdomen pelvis in April 2021 was negative for pancreatic lesions or masses.  Her gallbladder has been removed. Last saw Dr. Wynonia Lawman on 07/29/2018-reviewed his note.  She had a neurostimulator implanted for pain relief.  No chest discomfort no tachycardia.  No PND.  In September 2011 and 2019 had a nuclear stress test reportedly normal with EF 63%.  She has a family history of coronary artery disease with her brother dying of heart attack, another brother had an aortic aneurysm, her father died with congestive heart failure.  She quit smoking in 1994.  She is married to [place] whom I take care of is well with coronary artery disease.  We joked about golden Oreos.  TSH 0.54 hemoglobin A1c 6.1 creatinine 1.59 potassium 4.5 ALT 7 LDL 48  Past Medical History:  Diagnosis Date  . Anxiety   . Arthritis    cerv & lumbar arthritis  . Cervical dystonia    In remission  . Chronic kidney disease    R  cyst, obstruction- Dr. Lorrene Reid seen pt. 12/2017  . Colitis   . Depression    [name] - sees counsellor, no meds.  Marland Kitchen Dyspnea   . GERD (gastroesophageal reflux disease)   . Hypertension   . Neuromuscular disorder ([place])   . Sleep apnea    told that she has apnea - on sleep study, cannot tolerate   . 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 ABDOMINAL HYSTERECTOMY    . APPENDECTOMY    . CHOLECYSTECTOMY    . JOINT REPLACEMENT  2012   R knee   . KNEE SURGERY    . REVERSE SHOULDER ARTHROPLASTY Right 07/09/2019   Procedure: REVERSE SHOULDER ARTHROPLASTY;  Surgeon: Justice Britain, MD;  Location: [facility];  Service: Orthopedics;  Laterality: Right;  182min  . ROTATOR CUFF REPAIR Right 2016  . SPINAL CORD STIMULATOR BATTERY EXCHANGE N/A 08/20/2018   Procedure: SPINAL CORD STIMULATOR BATTERY EXCHANGE/REPLACE WITH RECHARGEABLE BATTERY;  Surgeon: Melina Schools, MD;  Location: [place];  Service: Orthopedics;  Laterality: N/A;  . SPINAL CORD STIMULATOR INSERTION N/A 01/30/2018   Procedure: LUMBAR SPINAL CORD STIMULATOR INSERTION;  Surgeon: Melina Schools, MD;  Location: [place];  Service: Orthopedics;  Laterality: N/A;  2.5 hrs  . TONSILLECTOMY      Current Medications: Current  Meds  Medication Sig  . atorvastatin (LIPITOR) 40 MG tablet Take 40 mg by mouth at bedtime.   . clonazePAM (KLONOPIN) 1 MG tablet Take 1 mg by mouth daily as needed for anxiety.  . cyclobenzaprine (FLEXERIL) 10 MG tablet Take 1 tablet (10 mg total) by mouth 3 (three) times daily as needed for muscle spasms.  . hydrochlorothiazide (HYDRODIURIL) 25 MG tablet Take 25 mg by mouth daily.  Marland Kitchen levothyroxine (SYNTHROID, LEVOTHROID) 75 MCG tablet Take 75 mcg by mouth daily before breakfast.   . Melatonin 10 MG CAPS Take 10 mg by mouth at bedtime as needed (for sleep).  . OLANZapine (ZYPREXA) 5 MG tablet Take 5 mg by mouth at bedtime.   Marland Kitchen omeprazole (PRILOSEC) 40 MG  capsule Take 40 mg by mouth at bedtime.   . ondansetron (ZOFRAN) 4 MG tablet Take 1 tablet (4 mg total) by mouth every 8 (eight) hours as needed for nausea or vomiting.  Marland Kitchen oxyCODONE-acetaminophen (PERCOCET) 5-325 MG tablet Take 1 tablet by mouth every 4 (four) hours as needed (max 6 q).  . raloxifene (EVISTA) 60 MG tablet Take 60 mg by mouth every evening.  . vortioxetine HBr (TRINTELLIX) 10 MG TABS tablet Take 10 mg by mouth every morning.  . zolpidem (AMBIEN) 10 MG tablet Take 10 mg by mouth at bedtime.     Allergies:   Botox [onabotulinumtoxina], Sulfa antibiotics, Ciprofloxacin, and Gabapentin   Social History   Socioeconomic History  . Marital status: Married    Spouse name: Ronalee Belts  . Number of children: 3  . Years of education: college  . Highest education level: Not on file  Occupational History    Employer: RETIRED    Comment: retired  Tobacco Use  . Smoking status: Former Smoker    Types: Cigarettes    Quit date: 1991    Years since quitting: 31.1  . Smokeless tobacco: Never Used  Vaping Use  . Vaping Use: Never used  Substance and Sexual Activity  . Alcohol use: No  . Drug use: No  . Sexual activity: Not on file  Other Topics Concern  . Not on file  Social History Narrative   Patient lives at home with her husband Ronalee Belts)    Retired    Right handed   Some college   Caffeine three cups daily   Social Determinants of Health   Financial Resource Strain: Not on file  Food Insecurity: Not on file  Transportation Needs: Not on file  Physical Activity: Not on file  Stress: Not on file  Social Connections: Not on file     Family History: The patient's family history includes Alzheimer's disease in her mother; Heart disease in her father.  ROS:   Please see the history of present illness.     All other systems reviewed and are negative.  EKGs/Labs/Other Studies Reviewed:    The following studies were reviewed today: Outside office notes from Dr. Wynonia Lawman as  well as Dr. Brigitte Pulse reviewed, studies as above  EKG:  EKG is  ordered today.  The ekg ordered today demonstrates sinus rhythm 88 with nonspecific ST-T wave changes, prior EKG demonstrates minor nonspecific ST-T wave changes sinus rhythm and occasional PVC.  Recent Labs: 07/26/2020: ALT 32; BUN 18; Creatinine, Ser 1.45; Hemoglobin 12.9; Platelets 399; Potassium 3.4; Sodium 138  Recent Lipid Panel No results found for: CHOL, TRIG, HDL, CHOLHDL, VLDL, LDLCALC, LDLDIRECT   Risk Assessment/Calculations:      Physical Exam:    VS:  BP 106/76 (BP Location: Left Arm, Patient Position: Sitting, Cuff Size: Normal)   Pulse 88   Ht 5\' 2"  (1.575 m)   Wt 171 lb (77.6 kg)   BMI 31.28 kg/m     Wt Readings from Last 3 Encounters:  01/30/21 171 lb (77.6 kg)  07/09/19 192 lb 2 oz (87.1 kg)  07/07/19 192 lb 2 oz (87.1 kg)     GEN:  Well nourished, well developed in no acute distress, came in today pushed by Ronalee Belts, her husband on her rolling walker. HEENT: Normal NECK: No JVD; No carotid bruits LYMPHATICS: No lymphadenopathy CARDIAC: RRR, no murmurs, rubs, gallops RESPIRATORY:  Clear to auscultation without rales, wheezing or rhonchi  ABDOMEN: Soft, non-tender, non-distended MUSCULOSKELETAL:  No edema; No deformity  SKIN: Warm and dry NEUROLOGIC:  Alert and oriented x 3 PSYCHIATRIC:  Normal affect, fatigued  ASSESSMENT:    1. Pre-operative cardiovascular examination   2. Precordial pain    PLAN:    In order of problems listed above:  Chest discomfort -Centralized, sudden onset lasting about 5 minutes duration while sitting with radiation to the inside of her left arm.  Nothing seems to bring this on.  Happens about once per week. -We will go ahead and check a pharmacologic stress test to ensure that there is no high risk of ischemia.  Preoperative risk ratification -Pharmacologic stress test being ordered.  She is going to undergo possible nephrectomy at some point for kidney cysts which  are causing discomfort.  Also first she will undergo lead revision for back pain stimulator.  PVCs -Stable, no high risk symptoms such as syncope.  No PVCs noted on ECG this morning.  Aortic atherosclerosis -Seen on prior abdominal CT on 2018.  On atorvastatin.   Shared Decision Making/Informed Consent The risks [chest pain, shortness of breath, cardiac arrhythmias, dizziness, blood pressure fluctuations, myocardial infarction, stroke/transient ischemic attack, nausea, vomiting, allergic reaction, radiation exposure, metallic taste sensation and life-threatening complications (estimated to be 1 in 10,000)], benefits (risk stratification, diagnosing coronary artery disease, treatment guidance) and alternatives of a nuclear stress test were discussed in detail with Ms. Costilow and she agrees to proceed.       Medication Adjustments/Labs and Tests Ordered: Current medicines are reviewed at length with the patient today.  Concerns regarding medicines are outlined above.  Orders Placed This Encounter  Procedures  . MYOCARDIAL PERFUSION IMAGING  . EKG 12-Lead   No orders of the defined types were placed in this encounter.   Patient Instructions  Medication Instructions:  The current medical regimen is effective;  continue present plan and medications.  *If you need a refill on your cardiac medications before your next appointment, please call your pharmacy*  Testing/Procedures: Your physician has requested that you have a lexiscan myoview. For further information please visit [email or web address]. Please follow instruction sheet, as given.  Follow-Up: At [facility], you and your health needs are our priority.  As part of our continuing mission to provide you with exceptional heart care, we have created designated Provider Care Teams.  These Care Teams include your primary Cardiologist (physician) and Advanced Practice Providers (APPs -  Physician Assistants and Nurse Practitioners)  who all work together to provide you with the care you need, when you need it.  We recommend signing up for the patient portal called "MyChart".  Sign up information is provided on this After Visit Summary.  MyChart is used to connect with patients for Virtual Visits (Telemedicine).  Patients are able  to view lab/test results, encounter notes, upcoming appointments, etc.  Non-urgent messages can be sent to your provider as well.   To learn more about what you can do with MyChart, go to [email or web address].    Your next appointment:   Follow up will be based on the results of the above testing.  Thank you for choosing [facility]!!        Signed, Candee Furbish, MD  01/30/2021 11:57 AM    [place]

## 2021-01-30 NOTE — Patient Instructions (Signed)
Medication Instructions:  The current medical regimen is effective;  continue present plan and medications.  *If you need a refill on your cardiac medications before your next appointment, please call your pharmacy*  Testing/Procedures: Your physician has requested that you have a lexiscan myoview. For further information please visit [email or web address]. Please follow instruction sheet, as given.  Follow-Up: At [facility], you and your health needs are our priority.  As part of our continuing mission to provide you with exceptional heart care, we have created designated Provider Care Teams.  These Care Teams include your primary Cardiologist (physician) and Advanced Practice Providers (APPs -  Physician Assistants and Nurse Practitioners) who all work together to provide you with the care you need, when you need it.  We recommend signing up for the patient portal called "MyChart".  Sign up information is provided on this After Visit Summary.  MyChart is used to connect with patients for Virtual Visits (Telemedicine).  Patients are able to view lab/test results, encounter notes, upcoming appointments, etc.  Non-urgent messages can be sent to your provider as well.   To learn more about what you can do with MyChart, go to [email or web address].    Your next appointment:   Follow up will be based on the results of the above testing.  Thank you for choosing [facility]!!    

## 2021-02-06 ENCOUNTER — Telehealth (HOSPITAL_COMMUNITY): Payer: Self-pay | Admitting: *Deleted

## 2021-02-06 NOTE — Telephone Encounter (Signed)
Patient given detailed instructions per Myocardial Perfusion Study Information Sheet for the test on 02/08/21. Patient notified to arrive 15 minutes early and that it is imperative to arrive on time for appointment to keep from having the test rescheduled.  If you need to cancel or reschedule your appointment, please call the office within 24 hours of your appointment. . Patient verbalized understanding. [name]    

## 2021-02-07 DIAGNOSIS — G894 Chronic pain syndrome: Secondary | ICD-10-CM | POA: Diagnosis not present

## 2021-02-07 DIAGNOSIS — M533 Sacrococcygeal disorders, not elsewhere classified: Secondary | ICD-10-CM | POA: Diagnosis not present

## 2021-02-07 DIAGNOSIS — Z9689 Presence of other specified functional implants: Secondary | ICD-10-CM | POA: Diagnosis not present

## 2021-02-08 ENCOUNTER — Other Ambulatory Visit: Payer: Self-pay

## 2021-02-08 ENCOUNTER — Ambulatory Visit (HOSPITAL_COMMUNITY): Payer: Medicare HMO | Attending: Internal Medicine

## 2021-02-08 DIAGNOSIS — Z01810 Encounter for preprocedural cardiovascular examination: Secondary | ICD-10-CM | POA: Diagnosis not present

## 2021-02-08 DIAGNOSIS — R072 Precordial pain: Secondary | ICD-10-CM | POA: Diagnosis not present

## 2021-02-08 LAB — MYOCARDIAL PERFUSION IMAGING
LV dias vol: 55 mL (ref 46–106)
LV sys vol: 26 mL
Peak HR: 107 {beats}/min
Rest HR: 93 {beats}/min
SDS: 1
SRS: 2
SSS: 3
TID: 1.13

## 2021-02-08 MED ORDER — REGADENOSON 0.4 MG/5ML IV SOLN
0.4000 mg | Freq: Once | INTRAVENOUS | Status: AC
  Administered 2021-02-08: 0.4 mg via INTRAVENOUS

## 2021-02-08 MED ORDER — TECHNETIUM TC 99M TETROFOSMIN IV KIT
31.7000 | PACK | Freq: Once | INTRAVENOUS | Status: AC | PRN
  Administered 2021-02-08: 31.7 via INTRAVENOUS
  Filled 2021-02-08: qty 32

## 2021-02-08 MED ORDER — TECHNETIUM TC 99M TETROFOSMIN IV KIT
10.8000 | PACK | Freq: Once | INTRAVENOUS | Status: AC | PRN
  Administered 2021-02-08: 10.8 via INTRAVENOUS
  Filled 2021-02-08: qty 11

## 2021-02-15 ENCOUNTER — Telehealth: Payer: Self-pay | Admitting: Cardiology

## 2021-02-15 NOTE — Telephone Encounter (Signed)
Dr. Marlou Porch  I see that you ordered a stress test for this patient performed 02/08/21 that showed no ischemia. I assume you are ok with her proceeding with nephrectomy? We have received a surgical request with surgery date TBD.   Please send your response to the pre-op pool  Thank you  Sharee Pimple

## 2021-02-15 NOTE — Telephone Encounter (Signed)
   [facility] Pre-operative Risk Assessment    HEARTCARE STAFF: - Please ensure there is not already an duplicate clearance open for this procedure. - Under Visit Info/Reason for Call, type in Other and utilize the format Clearance MM/DD/YY or Clearance TBD. Do not use dashes or single digits. - If request is for dental extraction, please clarify the # of teeth to be extracted.  Request for surgical clearance:  1. What type of surgery is being performed?  Right Robotic assistance lyperechoic Radical Nephrectomy   2. When is this surgery scheduled? TBD    3. What type of clearance is required (medical clearance vs. Pharmacy clearance to hold med vs. Both)? Medical   4. Are there any medications that need to be held prior to surgery and how long? NA   5. Practice name and name of physician performing surgery? [facility]   6. What is the office phone number? [phone] ext [phone]   7.   What is the office fax number?  [phone]   8.   Anesthesia type (None, local, MAC, general) ? General    [name] 02/15/2021, 4:28 PM  _________________________________________________________________   (provider comments below)

## 2021-02-16 ENCOUNTER — Other Ambulatory Visit: Payer: Self-pay | Admitting: Urology

## 2021-02-16 NOTE — Telephone Encounter (Signed)
With stress test being low risk, she may proceed with surgery with low overall cardiac risk. Candee Furbish, MD

## 2021-02-17 DIAGNOSIS — M533 Sacrococcygeal disorders, not elsewhere classified: Secondary | ICD-10-CM | POA: Diagnosis not present

## 2021-02-20 DIAGNOSIS — R8271 Bacteriuria: Secondary | ICD-10-CM | POA: Diagnosis not present

## 2021-02-20 DIAGNOSIS — N130 Hydronephrosis with ureteropelvic junction obstruction: Secondary | ICD-10-CM | POA: Diagnosis not present

## 2021-02-20 DIAGNOSIS — G894 Chronic pain syndrome: Secondary | ICD-10-CM | POA: Diagnosis not present

## 2021-02-20 DIAGNOSIS — N270 Small kidney, unilateral: Secondary | ICD-10-CM | POA: Diagnosis not present

## 2021-03-08 DIAGNOSIS — K7689 Other specified diseases of liver: Secondary | ICD-10-CM | POA: Diagnosis not present

## 2021-03-08 DIAGNOSIS — Z9049 Acquired absence of other specified parts of digestive tract: Secondary | ICD-10-CM | POA: Diagnosis not present

## 2021-03-08 DIAGNOSIS — I700 Atherosclerosis of aorta: Secondary | ICD-10-CM | POA: Diagnosis not present

## 2021-03-08 DIAGNOSIS — N1330 Unspecified hydronephrosis: Secondary | ICD-10-CM | POA: Diagnosis not present

## 2021-03-08 DIAGNOSIS — N270 Small kidney, unilateral: Secondary | ICD-10-CM | POA: Diagnosis not present

## 2021-03-13 DIAGNOSIS — M5136 Other intervertebral disc degeneration, lumbar region: Secondary | ICD-10-CM | POA: Diagnosis not present

## 2021-03-13 DIAGNOSIS — M519 Unspecified thoracic, thoracolumbar and lumbosacral intervertebral disc disorder: Secondary | ICD-10-CM | POA: Diagnosis not present

## 2021-03-13 DIAGNOSIS — M160 Bilateral primary osteoarthritis of hip: Secondary | ICD-10-CM | POA: Diagnosis not present

## 2021-03-13 DIAGNOSIS — M7918 Myalgia, other site: Secondary | ICD-10-CM | POA: Diagnosis not present

## 2021-03-13 DIAGNOSIS — Z9689 Presence of other specified functional implants: Secondary | ICD-10-CM | POA: Diagnosis not present

## 2021-03-13 DIAGNOSIS — Z5181 Encounter for therapeutic drug level monitoring: Secondary | ICD-10-CM | POA: Diagnosis not present

## 2021-03-13 DIAGNOSIS — M533 Sacrococcygeal disorders, not elsewhere classified: Secondary | ICD-10-CM | POA: Diagnosis not present

## 2021-03-13 DIAGNOSIS — M47816 Spondylosis without myelopathy or radiculopathy, lumbar region: Secondary | ICD-10-CM | POA: Diagnosis not present

## 2021-03-13 DIAGNOSIS — G894 Chronic pain syndrome: Secondary | ICD-10-CM | POA: Diagnosis not present

## 2021-03-13 DIAGNOSIS — Z79899 Other long term (current) drug therapy: Secondary | ICD-10-CM | POA: Diagnosis not present

## 2021-03-22 DIAGNOSIS — F5105 Insomnia due to other mental disorder: Secondary | ICD-10-CM | POA: Diagnosis not present

## 2021-03-22 DIAGNOSIS — F3132 Bipolar disorder, current episode depressed, moderate: Secondary | ICD-10-CM | POA: Diagnosis not present

## 2021-03-22 DIAGNOSIS — F411 Generalized anxiety disorder: Secondary | ICD-10-CM | POA: Diagnosis not present

## 2021-03-23 ENCOUNTER — Encounter (HOSPITAL_COMMUNITY): Payer: Self-pay

## 2021-03-23 NOTE — Patient Instructions (Signed)
DUE TO COVID-19 ONLY ONE VISITOR IS ALLOWED TO COME WITH YOU AND STAY IN THE WAITING ROOM ONLY DURING PRE OP AND PROCEDURE DAY OF SURGERY.   TWO   VISITOR  MAY VISIT WITH YOU AFTER SURGERY IN YOUR PRIVATE ROOM DURING VISITING HOURS ONLY!  YOU NEED TO HAVE A COVID 19 TEST ON___4-14____ @_______ , THIS TEST MUST BE DONE BEFORE SURGERY,  COVID TESTING SITE [address] [place] [address], IT IS ON THE RIGHT GOING OUT [address] APPROXIMATELY  2 MINUTES PAST [employer] ON THE RIGHT. ONCE YOUR COVID TEST IS COMPLETED,  PLEASE BEGIN THE QUARANTINE INSTRUCTIONS AS OUTLINED IN YOUR HANDOUT.                [name]  03/23/2021   Your procedure is scheduled on: 04-03-21   Report to [facility] Main  Entrance   Report to [facility]  at      [place] AM     Call this number if you have problems the morning of surgery [phone]    Remember: Drink one bottle of magnesium citrate by noon the day before surgery  Do not eat food or drink liquids :After Midnight. You may have clear liquids until    0415 am then nothing by mouth     CLEAR LIQUID DIET   Foods Allowed                                                                     Foods Excluded  Coffee and tea, regular and decaf                             liquids that you cannot  Plain Jell-O any favor except red or purple                                           see through such as: Fruit ices (not with fruit pulp)                                     milk, soups, orange juice  Iced Popsicles                                    All solid food Carbonated beverages, regular and diet                                    Cranberry, grape and apple juices Sports drinks like Gatorade Lightly seasoned clear broth or consume(fat free) Sugar, honey syrup _____________________________________________________________________      BRUSH YOUR TEETH MORNING OF SURGERY AND RINSE YOUR MOUTH OUT, NO CHEWING GUM  CANDY OR MINTS.    Take these medicines the morning of surgery with A SIP OF WATER: Trintellix, omeprazole,levothyroxine, Klonopin if needed  You may not have any metal on your body including hair pins and              piercings  Do not wear jewelry, make-up, lotions, powders or perfumes, deodorant             Do not wear nail polish on your fingernails.  Do not shave  48 hours prior to surgery.     Do not bring valuables to the hospital. [place].  Contacts, dentures or bridgework may not be worn into surgery.      Patients discharged the day of surgery will not be allowed to drive home. IF YOU ARE HAVING SURGERY AND GOING HOME THE SAME DAY, YOU MUST HAVE AN ADULT TO DRIVE YOU HOME AND BE WITH YOU FOR 24 HOURS. YOU MAY GO HOME BY TAXI OR UBER OR ORTHERWISE, BUT AN ADULT MUST ACCOMPANY YOU HOME AND STAY WITH YOU FOR 24 HOURS.  Name and phone number of your driver:  Special Instructions: N/A              Please read over the following fact sheets you were given: _____________________________________________________________________             [facility] - Preparing for Surgery Before surgery, you can play an important role.  Because skin is not sterile, your skin needs to be as free of germs as possible.  You can reduce the number of germs on your skin by washing with CHG (chlorahexidine gluconate) soap before surgery.  CHG is an antiseptic cleaner which kills germs and bonds with the skin to continue killing germs even after washing. Please DO NOT use if you have an allergy to CHG or antibacterial soaps.  If your skin becomes reddened/irritated stop using the CHG and inform your nurse when you arrive at Short Stay. Do not shave (including legs and underarms) for at least 48 hours prior to the first CHG shower.  You may shave your face/neck. Please follow these instructions carefully:  1.  Shower with CHG  Soap the night before surgery and the  morning of Surgery.  2.  If you choose to wash your hair, wash your hair first as usual with your  normal  shampoo.  3.  After you shampoo, rinse your hair and body thoroughly to remove the  shampoo.                           4.  Use CHG as you would any other liquid soap.  You can apply chg directly  to the skin and wash                       Gently with a scrungie or clean washcloth.  5.  Apply the CHG Soap to your body ONLY FROM THE NECK DOWN.   Do not use on face/ open                           Wound or open sores. Avoid contact with eyes, ears mouth and genitals (private parts).                       Wash face,  Genitals (private parts) with your normal soap.  6.  Wash thoroughly, paying special attention to the area where your surgery  will be performed.  7.  Thoroughly rinse your body with warm water from the neck down.  8.  DO NOT shower/wash with your normal soap after using and rinsing off  the CHG Soap.                9.  Pat yourself dry with a clean towel.            10.  Wear clean pajamas.            11.  Place clean sheets on your bed the night of your first shower and do not  sleep with pets. Day of Surgery : Do not apply any lotions/deodorants the morning of surgery.  Please wear clean clothes to the hospital/surgery center.  FAILURE TO FOLLOW THESE INSTRUCTIONS MAY RESULT IN THE CANCELLATION OF YOUR SURGERY PATIENT SIGNATURE_________________________________  NURSE SIGNATURE__________________________________  ________________________________________________________________________   [name]  An incentive spirometer is a tool that can help keep your lungs clear and active. This tool measures how well you are filling your lungs with each breath. Taking long deep breaths may help reverse or decrease the chance of developing breathing (pulmonary) problems (especially infection) following:  A long period of time  when you are unable to move or be active. BEFORE THE PROCEDURE   If the spirometer includes an indicator to show your best effort, your nurse or respiratory therapist will set it to a desired goal.  If possible, sit up straight or lean slightly forward. Try not to slouch.  Hold the incentive spirometer in an upright position. INSTRUCTIONS FOR USE  1. Sit on the edge of your bed if possible, or sit up as far as you can in bed or on a chair. 2. Hold the incentive spirometer in an upright position. 3. Breathe out normally. 4. Place the mouthpiece in your mouth and seal your lips tightly around it. 5. Breathe in slowly and as deeply as possible, raising the piston or the ball toward the top of the column. 6. Hold your breath for 3-5 seconds or for as long as possible. Allow the piston or ball to fall to the bottom of the column. 7. Remove the mouthpiece from your mouth and breathe out normally. 8. Rest for a few seconds and repeat Steps 1 through 7 at least 10 times every 1-2 hours when you are awake. Take your time and take a few normal breaths between deep breaths. 9. The spirometer may include an indicator to show your best effort. Use the indicator as a goal to work toward during each repetition. 10. After each set of 10 deep breaths, practice coughing to be sure your lungs are clear. If you have an incision (the cut made at the time of surgery), support your incision when coughing by placing a pillow or rolled up towels firmly against it. Once you are able to get out of bed, walk around indoors and cough well. You may stop using the incentive spirometer when instructed by your caregiver.  RISKS AND COMPLICATIONS  Take your time so you do not get dizzy or light-headed.  If you are in pain, you may need to take or ask for pain medication before doing incentive spirometry. It is harder to take a deep breath if you are having pain. AFTER USE  Rest and breathe slowly and easily.  It can be  helpful to keep track of a log of  your progress. Your caregiver can provide you with a simple table to help with this. If you are using the spirometer at home, follow these instructions: [place] IF:   You are having difficultly using the spirometer.  You have trouble using the spirometer as often as instructed.  Your pain medication is not giving enough relief while using the spirometer.  You develop fever of 100.5 F (38.1 C) or higher. SEEK IMMEDIATE MEDICAL CARE IF:   You cough up bloody sputum that had not been present before.  You develop fever of 102 F (38.9 C) or greater.  You develop worsening pain at or near the incision site. MAKE SURE YOU:   Understand these instructions.  Will watch your condition.  Will get help right away if you are not doing well or get worse. Document Released: 04/15/2007 Document Revised: 02/25/2012 Document Reviewed: 06/16/2007 [facility] Patient Information 2014 [place], [place].   ________________________________________________________________________

## 2021-03-23 NOTE — Progress Notes (Addendum)
PCP - Serita Grammes ., MD  Cardiologist - 02-16-21 clearance Candee Furbish, MD  PPM/ICD -  Device Orders -  Rep Notified -   Chest x-ray -  EKG - 01-30-21  Stress Test - 02-08-21 ECHO - 2019 Cardiac Cath -   Sleep Study -  CPAP - no Fasting Blood Sugar -  Checks Blood Sugar _____ times a day  Blood Thinner Instructions: Aspirin Instructions:325mg  stopped 4-4  ERAS Protcol - PRE-SURGERY Ensure or G2-   COVID TEST- 4-14  Activity--limited due pain no complaints of SOB with ADL's Anesthesia review: OSA no cpap, HTN  Patient denies shortness of breath, fever, cough and chest pain at PAT appointment   All instructions explained to the patient, with a verbal understanding of the material. Patient agrees to go over the instructions while at home for a better understanding. Patient also instructed to self quarantine after being tested for COVID-19. The opportunity to ask questions was provided.

## 2021-03-28 ENCOUNTER — Encounter (HOSPITAL_COMMUNITY)
Admission: RE | Admit: 2021-03-28 | Discharge: 2021-03-28 | Disposition: A | Discharge status: Home / Self Care | Payer: Medicare HMO | Source: Ambulatory Visit | Attending: Urology | Admitting: Urology

## 2021-03-28 ENCOUNTER — Encounter (HOSPITAL_COMMUNITY): Payer: Self-pay

## 2021-03-28 ENCOUNTER — Other Ambulatory Visit: Payer: Self-pay

## 2021-03-28 DIAGNOSIS — Z9689 Presence of other specified functional implants: Secondary | ICD-10-CM | POA: Diagnosis not present

## 2021-03-28 DIAGNOSIS — Z01812 Encounter for preprocedural laboratory examination: Secondary | ICD-10-CM | POA: Insufficient documentation

## 2021-03-28 DIAGNOSIS — M47816 Spondylosis without myelopathy or radiculopathy, lumbar region: Secondary | ICD-10-CM | POA: Diagnosis not present

## 2021-03-28 DIAGNOSIS — M7918 Myalgia, other site: Secondary | ICD-10-CM | POA: Diagnosis not present

## 2021-03-28 HISTORY — DX: Headache, unspecified: R51.9

## 2021-03-28 HISTORY — DX: Unspecified dementia, unspecified severity, without behavioral disturbance, psychotic disturbance, mood disturbance, and anxiety: F03.90

## 2021-03-28 LAB — CBC
HCT: 45.1 % (ref 36.0–46.0)
Hemoglobin: 14.2 g/dL (ref 12.0–15.0)
MCH: 27.5 pg (ref 26.0–34.0)
MCHC: 31.5 g/dL (ref 30.0–36.0)
MCV: 87.2 fL (ref 80.0–100.0)
Platelets: 340 10*3/uL (ref 150–400)
RBC: 5.17 MIL/uL — ABNORMAL HIGH (ref 3.87–5.11)
RDW: 14.2 % (ref 11.5–15.5)
WBC: 10.1 10*3/uL (ref 4.0–10.5)
nRBC: 0 % (ref 0.0–0.2)

## 2021-03-28 LAB — BASIC METABOLIC PANEL
Anion gap: 11 (ref 5–15)
BUN: 18 mg/dL (ref 8–23)
CO2: 25 mmol/L (ref 22–32)
Calcium: 9.2 mg/dL (ref 8.9–10.3)
Chloride: 104 mmol/L (ref 98–111)
Creatinine, Ser: 1.23 mg/dL — ABNORMAL HIGH (ref 0.44–1.00)
GFR, Estimated: 46 mL/min — ABNORMAL LOW (ref >60)
Glucose, Bld: 104 mg/dL — ABNORMAL HIGH (ref 70–99)
Potassium: 3 mmol/L — ABNORMAL LOW (ref 3.5–5.1)
Sodium: 140 mmol/L (ref 135–145)

## 2021-03-28 NOTE — Progress Notes (Addendum)
Patient reports  a 25lb unintentional  weight loss since January. Advised to let PCP know

## 2021-03-30 ENCOUNTER — Other Ambulatory Visit (HOSPITAL_COMMUNITY)
Admission: RE | Admit: 2021-03-30 | Discharge: 2021-03-30 | Disposition: A | Discharge status: Home / Self Care | Payer: Medicare HMO | Source: Ambulatory Visit | Attending: Urology | Admitting: Urology

## 2021-03-30 DIAGNOSIS — Z20822 Contact with and (suspected) exposure to covid-19: Secondary | ICD-10-CM | POA: Diagnosis not present

## 2021-03-30 DIAGNOSIS — Z01812 Encounter for preprocedural laboratory examination: Secondary | ICD-10-CM | POA: Insufficient documentation

## 2021-03-30 LAB — SARS CORONAVIRUS 2 (TAT 6-24 HRS): SARS Coronavirus 2: NEGATIVE

## 2021-03-30 NOTE — Progress Notes (Signed)
Ok to have patient's family in preop per patient request.   Matt R. [facility]  Pager: [phone]

## 2021-04-02 NOTE — Anesthesia Preprocedure Evaluation (Addendum)
Anesthesia Evaluation  Patient identified by MRN, date of birth, ID band Patient awake    Reviewed: Allergy & Precautions, NPO status , Patient's Chart, lab work & pertinent test results  History of Anesthesia Complications Negative for: history of anesthetic complications  Airway Mallampati: III  TM Distance: >3 FB Neck ROM: Full    Dental  (+) Dental Advisory Given, Caps   Pulmonary sleep apnea (does not use CPAP) , former smoker,  03/30/2021 SARS coronavirus NEG   breath sounds clear to auscultation       Cardiovascular hypertension, Pt. on medications (-) angina Rhythm:Regular Rate:Normal  01/2021 Stress: EF 50%, No ST changes with stress '19 ECHO: EF 55-60%, mod LVH, no significant valvular abnormalities   Neuro/Psych  Headaches, Anxiety Depression Dementia    GI/Hepatic Neg liver ROS, GERD  Controlled,  Endo/Other  Hypothyroidism   Renal/GU Renal InsufficiencyRenal disease     Musculoskeletal  (+) Arthritis ,   Abdominal   Peds  Hematology negative hematology ROS (+)   Anesthesia Other Findings   Reproductive/Obstetrics                            Anesthesia Physical Anesthesia Plan  ASA: III  Anesthesia Plan: General   Post-op Pain Management:    Induction: Intravenous  PONV Risk Score and Plan: 3 and Ondansetron, Dexamethasone and Treatment may vary due to age or medical condition  Airway Management Planned: Video Laryngoscope Planned  Additional Equipment: None  Intra-op Plan:   Post-operative Plan: Extubation in OR  Informed Consent: I have reviewed the patients History and Physical, chart, labs and discussed the procedure including the risks, benefits and alternatives for the proposed anesthesia with the patient or authorized representative who has indicated his/her understanding and acceptance.     Dental advisory given and Consent reviewed with POA  Plan  Discussed with: CRNA and Surgeon  Anesthesia Plan Comments: (Discussed with patient and her husband)       Anesthesia Quick Evaluation

## 2021-04-03 ENCOUNTER — Inpatient Hospital Stay (HOSPITAL_COMMUNITY)
Admission: RE | Admit: 2021-04-03 | Discharge: 2021-04-04 | DRG: 661 | Disposition: A | Discharge status: Home / Self Care | Payer: Medicare HMO | Attending: Urology | Admitting: Urology

## 2021-04-03 ENCOUNTER — Encounter (HOSPITAL_COMMUNITY)
Admission: RE | Disposition: A | Discharge status: Home / Self Care | Payer: Self-pay | Source: Home / Self Care | Attending: Urology

## 2021-04-03 ENCOUNTER — Inpatient Hospital Stay (HOSPITAL_COMMUNITY): Payer: Medicare HMO | Admitting: Certified Registered Nurse Anesthetist

## 2021-04-03 ENCOUNTER — Other Ambulatory Visit: Payer: Self-pay

## 2021-04-03 DIAGNOSIS — Z8249 Family history of ischemic heart disease and other diseases of the circulatory system: Secondary | ICD-10-CM | POA: Diagnosis not present

## 2021-04-03 DIAGNOSIS — Z90710 Acquired absence of both cervix and uterus: Secondary | ICD-10-CM | POA: Diagnosis not present

## 2021-04-03 DIAGNOSIS — Z888 Allergy status to other drugs, medicaments and biological substances status: Secondary | ICD-10-CM | POA: Diagnosis not present

## 2021-04-03 DIAGNOSIS — Z96611 Presence of right artificial shoulder joint: Secondary | ICD-10-CM | POA: Diagnosis present

## 2021-04-03 DIAGNOSIS — F32A Depression, unspecified: Secondary | ICD-10-CM | POA: Diagnosis present

## 2021-04-03 DIAGNOSIS — E039 Hypothyroidism, unspecified: Secondary | ICD-10-CM | POA: Diagnosis not present

## 2021-04-03 DIAGNOSIS — Z96651 Presence of right artificial knee joint: Secondary | ICD-10-CM | POA: Diagnosis not present

## 2021-04-03 DIAGNOSIS — Z20822 Contact with and (suspected) exposure to covid-19: Secondary | ICD-10-CM | POA: Diagnosis not present

## 2021-04-03 DIAGNOSIS — N12 Tubulo-interstitial nephritis, not specified as acute or chronic: Secondary | ICD-10-CM | POA: Diagnosis not present

## 2021-04-03 DIAGNOSIS — N261 Atrophy of kidney (terminal): Secondary | ICD-10-CM | POA: Diagnosis present

## 2021-04-03 DIAGNOSIS — F0390 Unspecified dementia without behavioral disturbance: Secondary | ICD-10-CM | POA: Diagnosis not present

## 2021-04-03 DIAGNOSIS — N269 Renal sclerosis, unspecified: Secondary | ICD-10-CM | POA: Diagnosis not present

## 2021-04-03 DIAGNOSIS — Z9049 Acquired absence of other specified parts of digestive tract: Secondary | ICD-10-CM | POA: Diagnosis not present

## 2021-04-03 DIAGNOSIS — Z882 Allergy status to sulfonamides status: Secondary | ICD-10-CM | POA: Diagnosis not present

## 2021-04-03 DIAGNOSIS — N1330 Unspecified hydronephrosis: Secondary | ICD-10-CM | POA: Diagnosis not present

## 2021-04-03 DIAGNOSIS — N183 Chronic kidney disease, stage 3 unspecified: Secondary | ICD-10-CM | POA: Diagnosis not present

## 2021-04-03 DIAGNOSIS — K219 Gastro-esophageal reflux disease without esophagitis: Secondary | ICD-10-CM | POA: Diagnosis not present

## 2021-04-03 DIAGNOSIS — I10 Essential (primary) hypertension: Secondary | ICD-10-CM | POA: Diagnosis present

## 2021-04-03 DIAGNOSIS — G4730 Sleep apnea, unspecified: Secondary | ICD-10-CM | POA: Diagnosis present

## 2021-04-03 DIAGNOSIS — N289 Disorder of kidney and ureter, unspecified: Secondary | ICD-10-CM

## 2021-04-03 DIAGNOSIS — Z881 Allergy status to other antibiotic agents status: Secondary | ICD-10-CM | POA: Diagnosis not present

## 2021-04-03 DIAGNOSIS — F419 Anxiety disorder, unspecified: Secondary | ICD-10-CM | POA: Diagnosis not present

## 2021-04-03 DIAGNOSIS — Z87891 Personal history of nicotine dependence: Secondary | ICD-10-CM | POA: Diagnosis not present

## 2021-04-03 DIAGNOSIS — I129 Hypertensive chronic kidney disease with stage 1 through stage 4 chronic kidney disease, or unspecified chronic kidney disease: Secondary | ICD-10-CM | POA: Diagnosis not present

## 2021-04-03 DIAGNOSIS — R109 Unspecified abdominal pain: Secondary | ICD-10-CM | POA: Diagnosis present

## 2021-04-03 DIAGNOSIS — M47816 Spondylosis without myelopathy or radiculopathy, lumbar region: Secondary | ICD-10-CM | POA: Diagnosis present

## 2021-04-03 DIAGNOSIS — N1339 Other hydronephrosis: Secondary | ICD-10-CM | POA: Diagnosis not present

## 2021-04-03 DIAGNOSIS — N119 Chronic tubulo-interstitial nephritis, unspecified: Secondary | ICD-10-CM | POA: Diagnosis not present

## 2021-04-03 HISTORY — PX: ROBOT ASSISTED LAPAROSCOPIC NEPHRECTOMY: SHX5140

## 2021-04-03 LAB — TYPE AND SCREEN
ABO/RH(D): O POS
Antibody Screen: NEGATIVE

## 2021-04-03 LAB — HEMOGLOBIN AND HEMATOCRIT, BLOOD
HCT: 42.9 % (ref 36.0–46.0)
Hemoglobin: 13.4 g/dL (ref 12.0–15.0)

## 2021-04-03 SURGERY — NEPHRECTOMY, RADICAL, ROBOT-ASSISTED, LAPAROSCOPIC, ADULT
Anesthesia: General | Laterality: Right

## 2021-04-03 MED ORDER — ONDANSETRON HCL 4 MG/2ML IJ SOLN
INTRAMUSCULAR | Status: DC | PRN
  Administered 2021-04-03: 4 mg via INTRAVENOUS

## 2021-04-03 MED ORDER — SUGAMMADEX SODIUM 200 MG/2ML IV SOLN
INTRAVENOUS | Status: DC | PRN
  Administered 2021-04-03: 200 mg via INTRAVENOUS

## 2021-04-03 MED ORDER — LIDOCAINE 2% (20 MG/ML) 5 ML SYRINGE
INTRAMUSCULAR | Status: AC
  Filled 2021-04-03: qty 5

## 2021-04-03 MED ORDER — ONDANSETRON HCL 4 MG/2ML IJ SOLN
INTRAMUSCULAR | Status: AC
  Filled 2021-04-03: qty 2

## 2021-04-03 MED ORDER — DIPHENHYDRAMINE HCL 50 MG/ML IJ SOLN: 12.5000 mg | Freq: Four times a day (QID) | INTRAMUSCULAR | Status: DC | PRN

## 2021-04-03 MED ORDER — LABETALOL HCL 5 MG/ML IV SOLN
INTRAVENOUS | Status: AC
  Filled 2021-04-03: qty 4

## 2021-04-03 MED ORDER — ROCURONIUM BROMIDE 10 MG/ML (PF) SYRINGE
PREFILLED_SYRINGE | INTRAVENOUS | Status: AC
  Filled 2021-04-03: qty 10

## 2021-04-03 MED ORDER — CHLORHEXIDINE GLUCONATE 0.12 % MT SOLN
15.0000 mL | Freq: Once | OROMUCOSAL | Status: AC
  Administered 2021-04-03: 15 mL via OROMUCOSAL

## 2021-04-03 MED ORDER — OXYCODONE HCL 5 MG/5ML PO SOLN: 5.0000 mg | Freq: Once | ORAL | Status: DC | PRN

## 2021-04-03 MED ORDER — ROCURONIUM BROMIDE 10 MG/ML (PF) SYRINGE
PREFILLED_SYRINGE | INTRAVENOUS | Status: DC | PRN
  Administered 2021-04-03 (×2): 30 mg via INTRAVENOUS
  Administered 2021-04-03: 60 mg via INTRAVENOUS

## 2021-04-03 MED ORDER — ACETAMINOPHEN 500 MG PO TABS
1000.0000 mg | ORAL_TABLET | Freq: Once | ORAL | Status: AC
Start: 2021-04-03 — End: 2021-04-03
  Administered 2021-04-03: 1000 mg via ORAL
  Filled 2021-04-03: qty 2

## 2021-04-03 MED ORDER — SODIUM CHLORIDE (PF) 0.9 % IJ SOLN
INTRAMUSCULAR | Status: AC
  Filled 2021-04-03: qty 20

## 2021-04-03 MED ORDER — CEFAZOLIN SODIUM-DEXTROSE 2-4 GM/100ML-% IV SOLN
2.0000 g | INTRAVENOUS | Status: AC
  Administered 2021-04-03: 2 g via INTRAVENOUS
  Filled 2021-04-03: qty 100

## 2021-04-03 MED ORDER — CLONAZEPAM 1 MG PO TABS
1.0000 mg | ORAL_TABLET | Freq: Every day | ORAL | Status: DC | PRN
  Administered 2021-04-03: 1 mg via ORAL
  Filled 2021-04-03: qty 1

## 2021-04-03 MED ORDER — HYDROMORPHONE HCL 1 MG/ML IJ SOLN
0.5000 mg | INTRAMUSCULAR | Status: DC | PRN
  Administered 2021-04-03 – 2021-04-04 (×5): 1 mg via INTRAVENOUS
  Filled 2021-04-03 (×5): qty 1

## 2021-04-03 MED ORDER — MAGNESIUM CITRATE PO SOLN: 0.5000 | Freq: Once | ORAL | Status: DC

## 2021-04-03 MED ORDER — PROPOFOL 10 MG/ML IV BOLUS
INTRAVENOUS | Status: DC | PRN
  Administered 2021-04-03: 40 mg via INTRAVENOUS
  Administered 2021-04-03: 120 mg via INTRAVENOUS

## 2021-04-03 MED ORDER — OXYCODONE HCL 5 MG PO TABS: 5.0000 mg | ORAL_TABLET | Freq: Once | ORAL | Status: DC | PRN

## 2021-04-03 MED ORDER — HYDROCODONE-ACETAMINOPHEN 10-325 MG PO TABS
1.0000 | ORAL_TABLET | ORAL | Status: DC | PRN
  Administered 2021-04-03 – 2021-04-04 (×4): 1 via ORAL
  Filled 2021-04-03 (×4): qty 1

## 2021-04-03 MED ORDER — ATORVASTATIN CALCIUM 40 MG PO TABS: 40.0000 mg | ORAL_TABLET | Freq: Every day | ORAL | Status: DC

## 2021-04-03 MED ORDER — HYDROMORPHONE HCL 1 MG/ML IJ SOLN
0.2500 mg | INTRAMUSCULAR | Status: DC | PRN
  Administered 2021-04-03 (×2): 0.5 mg via INTRAVENOUS

## 2021-04-03 MED ORDER — BUPIVACAINE LIPOSOME 1.3 % IJ SUSP
20.0000 mL | Freq: Once | INTRAMUSCULAR | Status: AC
  Administered 2021-04-03: 20 mL
  Filled 2021-04-03: qty 20

## 2021-04-03 MED ORDER — PANTOPRAZOLE SODIUM 40 MG PO TBEC
80.0000 mg | DELAYED_RELEASE_TABLET | Freq: Every day | ORAL | Status: DC
  Administered 2021-04-04: 80 mg via ORAL
  Filled 2021-04-03: qty 2

## 2021-04-03 MED ORDER — LACTATED RINGERS IR SOLN
Status: DC | PRN
  Administered 2021-04-03: 1000 mL

## 2021-04-03 MED ORDER — STERILE WATER FOR IRRIGATION IR SOLN
Status: DC | PRN
  Administered 2021-04-03: 1000 mL

## 2021-04-03 MED ORDER — HYDROCODONE-ACETAMINOPHEN 10-325 MG PO TABS: 1.0000 | ORAL_TABLET | ORAL | 0 refills | Status: DC | PRN

## 2021-04-03 MED ORDER — DEXAMETHASONE SODIUM PHOSPHATE 10 MG/ML IJ SOLN
INTRAMUSCULAR | Status: AC
  Filled 2021-04-03: qty 1

## 2021-04-03 MED ORDER — LABETALOL HCL 5 MG/ML IV SOLN
INTRAVENOUS | Status: DC | PRN
  Administered 2021-04-03: 5 mg via INTRAVENOUS

## 2021-04-03 MED ORDER — ORAL CARE MOUTH RINSE: 15.0000 mL | Freq: Once | OROMUCOSAL | Status: AC

## 2021-04-03 MED ORDER — DEXTROSE-NACL 5-0.45 % IV SOLN: INTRAVENOUS | Status: DC

## 2021-04-03 MED ORDER — MIDAZOLAM HCL 2 MG/2ML IJ SOLN
0.5000 mg | Freq: Once | INTRAMUSCULAR | Status: DC | PRN
Start: 2021-04-03 — End: 2021-04-03

## 2021-04-03 MED ORDER — HYDROMORPHONE HCL 1 MG/ML IJ SOLN
INTRAMUSCULAR | Status: AC
  Filled 2021-04-03: qty 1

## 2021-04-03 MED ORDER — DEXAMETHASONE SODIUM PHOSPHATE 4 MG/ML IJ SOLN
INTRAMUSCULAR | Status: DC | PRN
  Administered 2021-04-03: 5 mg via INTRAVENOUS

## 2021-04-03 MED ORDER — FENTANYL CITRATE (PF) 100 MCG/2ML IJ SOLN
INTRAMUSCULAR | Status: AC
  Filled 2021-04-03: qty 2

## 2021-04-03 MED ORDER — FENTANYL CITRATE (PF) 100 MCG/2ML IJ SOLN
INTRAMUSCULAR | Status: DC | PRN
  Administered 2021-04-03: 100 ug via INTRAVENOUS
  Administered 2021-04-03 (×4): 50 ug via INTRAVENOUS

## 2021-04-03 MED ORDER — ONDANSETRON HCL 4 MG/2ML IJ SOLN: 4.0000 mg | INTRAMUSCULAR | Status: DC | PRN

## 2021-04-03 MED ORDER — LEVOTHYROXINE SODIUM 75 MCG PO TABS
75.0000 ug | ORAL_TABLET | Freq: Every day | ORAL | Status: DC
  Administered 2021-04-04: 75 ug via ORAL
  Filled 2021-04-03: qty 1

## 2021-04-03 MED ORDER — FENTANYL CITRATE (PF) 250 MCG/5ML IJ SOLN
INTRAMUSCULAR | Status: AC
  Filled 2021-04-03: qty 5

## 2021-04-03 MED ORDER — BELLADONNA ALKALOIDS-OPIUM 16.2-60 MG RE SUPP: 1.0000 | Freq: Four times a day (QID) | RECTAL | Status: DC | PRN

## 2021-04-03 MED ORDER — SODIUM CHLORIDE (PF) 0.9 % IJ SOLN
INTRAMUSCULAR | Status: DC | PRN
  Administered 2021-04-03: 20 mL

## 2021-04-03 MED ORDER — VORTIOXETINE HBR 5 MG PO TABS
20.0000 mg | ORAL_TABLET | Freq: Every day | ORAL | Status: DC
  Administered 2021-04-04: 20 mg via ORAL
  Filled 2021-04-03: qty 4

## 2021-04-03 MED ORDER — PROMETHAZINE HCL 25 MG/ML IJ SOLN: 6.2500 mg | INTRAMUSCULAR | Status: DC | PRN

## 2021-04-03 MED ORDER — MEPERIDINE HCL 50 MG/ML IJ SOLN: 6.2500 mg | INTRAMUSCULAR | Status: DC | PRN

## 2021-04-03 MED ORDER — LACTATED RINGERS IV SOLN: INTRAVENOUS | Status: DC

## 2021-04-03 MED ORDER — PROPOFOL 10 MG/ML IV BOLUS
INTRAVENOUS | Status: AC
  Filled 2021-04-03: qty 20

## 2021-04-03 MED ORDER — SENNOSIDES-DOCUSATE SODIUM 8.6-50 MG PO TABS
2.0000 | ORAL_TABLET | Freq: Every day | ORAL | Status: DC
  Administered 2021-04-03: 2 via ORAL
  Filled 2021-04-03: qty 2

## 2021-04-03 MED ORDER — DIPHENHYDRAMINE HCL 12.5 MG/5ML PO ELIX: 12.5000 mg | ORAL_SOLUTION | Freq: Four times a day (QID) | ORAL | Status: DC | PRN

## 2021-04-03 MED ORDER — CHLORHEXIDINE GLUCONATE CLOTH 2 % EX PADS
6.0000 | MEDICATED_PAD | Freq: Every day | CUTANEOUS | Status: DC
  Administered 2021-04-04: 6 via TOPICAL

## 2021-04-03 MED ORDER — ACETAMINOPHEN 325 MG PO TABS
650.0000 mg | ORAL_TABLET | ORAL | Status: DC | PRN
Start: 2021-04-03 — End: 2021-04-04

## 2021-04-03 SURGICAL SUPPLY — 68 items
ADH SKN CLS APL DERMABOND .7 (GAUZE/BANDAGES/DRESSINGS) ×1
APL PRP STRL LF DISP 70% ISPRP (MISCELLANEOUS) ×1
BAG LAPAROSCOPIC 12 15 PORT 16 (BASKET) ×1 IMPLANT
BAG RETRIEVAL 12/15 (BASKET) ×2
CHLORAPREP W/TINT 26 (MISCELLANEOUS) ×2 IMPLANT
CLIP VESOLOCK LG 6/CT PURPLE (CLIP) ×2 IMPLANT
CLIP VESOLOCK MED LG 6/CT (CLIP) ×2 IMPLANT
CLIP VESOLOCK XL 6/CT (CLIP) ×2 IMPLANT
COVER SURGICAL LIGHT HANDLE (MISCELLANEOUS) ×2 IMPLANT
COVER TIP SHEARS 8 DVNC (MISCELLANEOUS) ×2 IMPLANT
COVER TIP SHEARS 8MM DA VINCI (MISCELLANEOUS) ×4
COVER WAND RF STERILE (DRAPES) ×2 IMPLANT
CUTTER ECHEON FLEX ENDO 45 340 (ENDOMECHANICALS) ×2 IMPLANT
DECANTER SPIKE VIAL GLASS SM (MISCELLANEOUS) ×2 IMPLANT
DERMABOND ADVANCED (GAUZE/BANDAGES/DRESSINGS) ×1
DERMABOND ADVANCED .7 DNX12 (GAUZE/BANDAGES/DRESSINGS) ×1 IMPLANT
DRAIN CHANNEL 15F RND FF 3/16 (WOUND CARE) IMPLANT
DRAPE ARM DVNC X/XI (DISPOSABLE) ×4 IMPLANT
DRAPE COLUMN DVNC XI (DISPOSABLE) ×1 IMPLANT
DRAPE DA VINCI XI ARM (DISPOSABLE) ×8
DRAPE DA VINCI XI COLUMN (DISPOSABLE) ×2
DRAPE INCISE IOBAN 66X45 STRL (DRAPES) ×2 IMPLANT
DRAPE SHEET LG 3/4 BI-LAMINATE (DRAPES) ×2 IMPLANT
ELECT PENCIL ROCKER SW 15FT (MISCELLANEOUS) ×2 IMPLANT
ELECT REM PT RETURN 15FT ADLT (MISCELLANEOUS) ×2 IMPLANT
EVACUATOR SILICONE 100CC (DRAIN) IMPLANT
GLOVE SURG ENC MOIS LTX SZ6.5 (GLOVE) ×2 IMPLANT
GLOVE SURG ENC TEXT LTX SZ7 (GLOVE) ×2 IMPLANT
GLOVE SURG ENC TEXT LTX SZ7.5 (GLOVE) ×4 IMPLANT
GLOVE SURG UNDER POLY LF SZ7 (GLOVE) ×2 IMPLANT
GOWN STRL REUS W/TWL LRG LVL3 (GOWN DISPOSABLE) ×6 IMPLANT
HEMOSTAT SURGICEL 4X8 (HEMOSTASIS) ×2 IMPLANT
HOLDER FOLEY CATH W/STRAP (MISCELLANEOUS) ×2 IMPLANT
IRRIG SUCT STRYKERFLOW 2 WTIP (MISCELLANEOUS) ×2
IRRIGATION SUCT STRKRFLW 2 WTP (MISCELLANEOUS) ×1 IMPLANT
KIT BASIN OR (CUSTOM PROCEDURE TRAY) ×2 IMPLANT
KIT TURNOVER KIT A (KITS) ×2 IMPLANT
LOOP VESSEL MAXI BLUE (MISCELLANEOUS) IMPLANT
MARKER SKIN DUAL TIP RULER LAB (MISCELLANEOUS) ×2 IMPLANT
NEEDLE INSUFFLATION 14GA 120MM (NEEDLE) ×2 IMPLANT
PATTIES SURGICAL .5 X3 (DISPOSABLE) IMPLANT
PROTECTOR NERVE ULNAR (MISCELLANEOUS) ×4 IMPLANT
RELOAD 45 VASCULAR/THIN (ENDOMECHANICALS) ×2 IMPLANT
SEAL CANN UNIV 5-8 DVNC XI (MISCELLANEOUS) ×4 IMPLANT
SEAL XI 5MM-8MM UNIVERSAL (MISCELLANEOUS) ×8
SEALER VESSEL DA VINCI XI (MISCELLANEOUS)
SEALER VESSEL EXT DVNC XI (MISCELLANEOUS) IMPLANT
SET TUBE SMOKE EVAC HIGH FLOW (TUBING) ×2 IMPLANT
SOLUTION ELECTROLUBE (MISCELLANEOUS) ×2 IMPLANT
SPONGE LAP 4X18 RFD (DISPOSABLE) IMPLANT
STAPLE RELOAD 45 WHT (STAPLE) ×3 IMPLANT
STAPLE RELOAD 45MM WHITE (STAPLE) ×6
SUT ETHILON 3 0 PS 1 (SUTURE) IMPLANT
SUT MNCRL AB 4-0 PS2 18 (SUTURE) ×4 IMPLANT
SUT PDS AB 1 CT1 27 (SUTURE) ×4 IMPLANT
SUT VIC AB 0 CT1 27 (SUTURE) ×2
SUT VIC AB 0 CT1 27XBRD ANTBC (SUTURE) ×1 IMPLANT
SUT VIC AB 3-0 SH 27 (SUTURE) ×2
SUT VIC AB 3-0 SH 27XBRD (SUTURE) ×1 IMPLANT
SUT VICRYL 0 UR6 27IN ABS (SUTURE) IMPLANT
TOWEL OR 17X26 10 PK STRL BLUE (TOWEL DISPOSABLE) ×2 IMPLANT
TOWEL OR NON WOVEN STRL DISP B (DISPOSABLE) ×2 IMPLANT
TRAY FOLEY MTR SLVR 16FR STAT (SET/KITS/TRAYS/PACK) ×2 IMPLANT
TRAY LAPAROSCOPIC (CUSTOM PROCEDURE TRAY) ×2 IMPLANT
TROCAR BLADELESS OPT 5 100 (ENDOMECHANICALS) ×2 IMPLANT
TROCAR UNIVERSAL OPT 12M 100M (ENDOMECHANICALS) ×2 IMPLANT
TROCAR XCEL 12X100 BLDLESS (ENDOMECHANICALS) ×2 IMPLANT
WATER STERILE IRR 1000ML POUR (IV SOLUTION) ×2 IMPLANT

## 2021-04-03 NOTE — Op Note (Signed)
Operative Note  Preoperative diagnosis:  1.  Chronic right hydronephrosis 2. Atrophic right kidney 3. Chronic right flank pain  Postoperative diagnosis: 1.  Chronic right hydronephrosis 2. Atrophic right kidney 3. Chronic right flank pain  Procedure(s): 1.  Robotic assisted laparoscopic right radical nephrectomy (adrenal sparing)  Surgeon: Rexene Alberts, MD  Assistants:  Debbrah Alar, PA  An assistant was required for this surgical procedure.  The duties of the assistant included but were not limited to suctioning, passing suture, camera manipulation, retraction.  This procedure would not be able to be performed without an Environmental consultant.   Anesthesia:  General  Complications:  None  EBL:  63ml  Specimens: 1.  ID Type Source Tests Collected by Time Destination  1 : Right kidney Tissue PATH GU biopsy SURGICAL PATHOLOGY Cassandra Lima, MD 04/03/2021 1011    Drains/Catheters: 1.  None  Intraoperative findings:   1. Significantly dilated right renal pelvis, atrophic right kidney, 1 artery and 1 vein each taken separately with laparoscopic battery operated stapler with excellent hemostasis.  Indication:  [name] is a 75 y.o. female with chronic right hydronephrosis with 16% function on lasix renogram, chronic right sided flank pain here for right robotic assisted laparoscopic nephrectomy.  Description of procedure: The indications, alternatives, benefits and risks were discussed with the patient and informed consent was obtained.  The patient was brought onto the operating room table, positioned supine and secured to the bed with a safety strap.  All pressure points were carefully padded and pneumatic compression devices were placed on the lower extremities.  After the administration of intravenous antibiotics and general endotracheal anesthesia, a 16 French urethral catheter was inserted to drain the bladder.  The patient was repositioned in the left lateral decubitus with the  right side elevated at a 70 degree angle with the left lower leg flexed and the right upper leg extended.  An axillary roll was positioned to protect the brachial plexus and a gel pad was placed to support the back.  Multiple pillows were used to pad beneath and between the lower extremities and to ensure adequate cushioning. The right arm was placed in an armrest and carefully padded. The patient was secured in place across the hips, chest and legs with foam padding and silk tape, and the table was flexed.  The patient was prepped and draped in the standard sterile manner.  The radiographic images were in the room.  Timeout was completed verifying the correct patient, surgical procedure, site and positioning, prior to beginning the procedure.  Pneumoperitoneum was introduced by placing a Veress needle just lateral to the rectus belly into the abdomen and insufflating with CO2 to a pressure of 15 mmHg.  The camera trocar was placed approximately 7-1/2 cm inferior and 2 cm medially to the planned position of the right robotic arm which was approximately 2 fingerbreadths inferior to the costal margin.  The 0 degree lens was inserted under direct visualization.  The abdominal cavity was examined for any signs of injury, adhesions and identification of anatomic landmarks. We then placed our right robotic trocar, left robotic trocar and fourth arm trocar.  A 12 mm assistant port was placed periumbilically and a 5 mm assistant port was placed just inferior to the xiphoid process to use as a liver retractor.  The robot was then docked.  The white line of Toldt was incised and the colon was reflected medially, exposing Gerota's fascia.  The hepatic flexure was mobilized.  This allowed for  further retraction of the liver. There was a significantly dilated renal pelvis with the duodenum draping across it. The duodenum was kocherized and the inferior vena cava was visualized.  The retroperitoneal fascia overlying the renal  vessels was carefully separated, exposing the underlying renal vein. At this point, in order to achieve excellent visualization, I incised the renal pelvis and a suction was used to suction out the urine. I was able to achieve an adequate lift using the fourth arm to elevate the kidney, further exposing the hilum.  The right renal vein and right renal artery were carefully dissected and mobilized.  A laparoscopic battery-operated stapler with a vascular load was used to control and ligate the right renal artery.  A separate vascular load was used to secure the right renal vein.  Next, I mobilized the upper pole and cauterized remaining attachments to the right kidney. Special attention was given to the cranial connections to the adrenal gland, which were carefully divided using the stapler completely freeing the adrenal off the superior pole of the kidney.  The right ureter was divided between clips.   The kidney was placed in an Endo Catch bag.  The renal fossa and remainder of the operating field were inspected for bleeding or injury.  The insufflation pressure was reduced, again confirming the absence of bleeding.  Excellent hemostasis was obtained.  A mini-Gibson incision was made at the site of the fourth arm trocar.  The fascia was opened and the specimen was removed in a muscle-sparing fashion.  The ports were removed under direct vision.  Carter-Thomason device was used to close the 12 mm port.  All wounds were copiously irrigated and then infiltrated with Exparel.  The muscle was reapproximated using 0 Vicryl as a first layer. 1-0 PDS was used to close the fascia as a second layer.  The skin incisions were reapproximated with 4-0 Monocryl.  Dermabond was then applied on the skin.  At the end of the procedure, all counts were correct.  The patient tolerated the procedure well and was taken to the recovery room in satisfactory condition.  Matt R. [facility]  Pager: [phone]

## 2021-04-03 NOTE — Anesthesia Procedure Notes (Signed)
Procedure Name: Intubation Date/Time: 04/03/2021 7:36 AM Performed by: Claudia Desanctis, CRNA Pre-anesthesia Checklist: Emergency Drugs available, Suction available, Patient identified and Patient being monitored Patient Re-evaluated:Patient Re-evaluated prior to induction Oxygen Delivery Method: Circle system utilized Preoxygenation: Pre-oxygenation with 100% oxygen Induction Type: IV induction Ventilation: Mask ventilation without difficulty Laryngoscope Size: Glidescope and 3 Grade View: Grade I Tube type: Oral Tube size: 7.0 mm Number of attempts: 1 Airway Equipment and Method: Video-laryngoscopy Placement Confirmation: ETT inserted through vocal cords under direct vision,  positive ETCO2 and breath sounds checked- equal and bilateral Secured at: 21 cm Tube secured with: Tape Dental Injury: Teeth and Oropharynx as per pre-operative assessment  Difficulty Due To: Difficulty was anticipated Comments: glidescope used electively due to stiff neck, upper prominent bridge work and small mouth opening.   Easy grade 1 view with glidescope

## 2021-04-03 NOTE — Discharge Instructions (Signed)

## 2021-04-03 NOTE — Transfer of Care (Signed)
Immediate Anesthesia Transfer of Care Note  Patient: [name]  Procedure(s) Performed: XI ROBOTIC ASSISTED LAPAROSCOPIC RADICAL NEPHRECTOMY (Right )  Patient Location: PACU  Anesthesia Type:General  Level of Consciousness: awake, alert , oriented and patient cooperative  Airway & Oxygen Therapy: Patient Spontanous Breathing and Patient connected to face mask oxygen  Post-op Assessment: Report given to RN, Post -op Vital signs reviewed and stable and Patient moving all extremities  Post vital signs: Reviewed and stable  Last Vitals:  Vitals Value Taken Time  BP    Temp    Pulse 74 04/03/21 1038  Resp 15 04/03/21 1038  SpO2 100 % 04/03/21 1038  Vitals shown include unvalidated device data.  Last Pain:  Vitals:   04/03/21 0531  TempSrc:   PainSc: 6          Complications: No complications documented.

## 2021-04-03 NOTE — H&P (Signed)
Urology Preoperative H&P   Chief Complaint: Right flank pain  History of Present Illness: [name] is a 75 y.o. female with chronic right flank pain, atrophic right kidney here for right nephrectomy. Denies fevers, chills, dysuria.    Past Medical History:  Diagnosis Date  . Anxiety   . Arthritis    cerv & lumbar arthritis  . Cervical dystonia    In remission  . Chronic kidney disease    R cyst, obstruction- Dr. Lorrene Day seen pt. 12/2017  . Colitis   . Dementia ([name])   . Depression    [name] - sees counsellor, no meds.  Marland Kitchen GERD (gastroesophageal reflux disease)   . Headache   . Hypertension   . Neuromuscular disorder ([name])   . Sleep apnea    told that she has apnea - on sleep study, cannot tolerate CPAP  . 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 ABDOMINAL HYSTERECTOMY    . APPENDECTOMY    . CHOLECYSTECTOMY    . JOINT REPLACEMENT  2012   R knee   . KNEE SURGERY    . REVERSE SHOULDER ARTHROPLASTY Right 07/09/2019   Procedure: REVERSE SHOULDER ARTHROPLASTY;  Surgeon: Cassandra Britain, MD;  Location: [facility];  Service: Orthopedics;  Laterality: Right;  158min  . ROTATOR CUFF REPAIR Right 2016  . SPINAL CORD STIMULATOR BATTERY EXCHANGE N/A 08/20/2018   Procedure: SPINAL CORD STIMULATOR BATTERY EXCHANGE/REPLACE WITH RECHARGEABLE BATTERY;  Surgeon: Cassandra Schools, MD;  Location: [place];  Service: Orthopedics;  Laterality: N/A;  . SPINAL CORD STIMULATOR INSERTION N/A 01/30/2018   Procedure: LUMBAR SPINAL CORD STIMULATOR INSERTION;  Surgeon: Cassandra Schools, MD;  Location: [place];  Service: Orthopedics;  Laterality: N/A;  2.5 hrs  . TONSILLECTOMY      Allergies:  Allergies  Allergen Reactions  . Botox [Onabotulinumtoxina] Anaphylaxis  . Sulfa Antibiotics Other (See Comments)    UNSPECIFIED REACTION of CHILDHOOD  . Ciprofloxacin Other (See Comments)    Yeast infections.  . Gabapentin Other (See  Comments)    Changes in patient mood    Family History  Problem Relation Age of Onset  . Alzheimer's disease Mother   . Heart disease Father     Social History:  reports that she quit smoking about 31 years ago. Her smoking use included cigarettes. She has never used smokeless tobacco. She reports that she does not drink alcohol and does not use drugs.  ROS: A complete review of systems was performed.  All systems are negative except for pertinent findings as noted.  Physical Exam:  Vital signs in last 24 hours: Temp:  [98.6 F (37 C)] 98.6 F (37 C) (04/18 0517) Pulse Rate:  [91] 91 (04/18 0517) Resp:  [17] 17 (04/18 0517) BP: (157)/(100) 157/100 (04/18 0517) SpO2:  [93 %] 93 % (04/18 0517) Weight:  [74.4 kg] 74.4 kg (04/18 0531) Constitutional:  Alert and oriented, No acute distress Cardiovascular: Regular rate and rhythm Respiratory: Normal respiratory effort, Lungs clear bilaterally GI: Abdomen is soft, nontender, nondistended, no abdominal masses GU: No CVA tenderness Lymphatic: No lymphadenopathy Neurologic: Grossly intact, no focal deficits Psychiatric: Normal mood and affect  Laboratory Data:  No results for input(s): WBC, HGB, HCT, PLT in the last 72 hours.  No results for input(s): NA, K, CL, GLUCOSE, BUN, CALCIUM, CREATININE in the last 72 hours.  Invalid input(s): CO3   No results found for this or any previous visit (  from the past 24 hour(s)). Recent Results (from the past 240 hour(s))  SARS CORONAVIRUS 2 (TAT 6-24 HRS) Nasopharyngeal Nasopharyngeal Swab     Status: None   Collection Time: 03/30/21  9:44 AM   Specimen: Nasopharyngeal Swab  Result Value Ref Range Status   SARS Coronavirus 2 NEGATIVE NEGATIVE Final    Comment: (NOTE) SARS-CoV-2 target nucleic acids are NOT DETECTED.  The SARS-CoV-2 RNA is generally detectable in upper and lower respiratory specimens during the acute phase of infection. Negative results do not preclude SARS-CoV-2  infection, do not rule out co-infections with other pathogens, and should not be used as the sole basis for treatment or other patient management decisions. Negative results must be combined with clinical observations, patient history, and epidemiological information. The expected result is Negative.  Fact Sheet for Patients: SugarRoll.be  Fact Sheet for Healthcare Providers: https://www.woods-mathews.com/  This test is not yet approved or cleared by the [place] FDA and  has been authorized for detection and/or diagnosis of SARS-CoV-2 by FDA under an Emergency Use Authorization (EUA). This EUA will remain  in effect (meaning this test can be used) for the duration of the COVID-19 declaration under Se ction 564(b)(1) of the Act, 21 U.S.C. section 360bbb-3(b)(1), unless the authorization is terminated or revoked sooner.  Performed at [facility], [place] [address], [place], [place] [address]     Renal Function: Recent Labs    03/28/21 1457  CREATININE 1.23*   Estimated Creatinine Clearance: 38.8 mL/min (A) (by C-G formula based on SCr of 1.23 mg/dL (H)).  Radiologic Imaging: No results found.  I independently reviewed the above imaging studies.  Assessment and Plan [name] is a 75 y.o. female with chronic right flank pain, atrophic right kidney here for right nephrectomy. Ok to proceed.   Cassandra R. Jaycee Pelzer MD 04/03/2021, 7:17 AM  [facility] Pager: [phone]): [phone]

## 2021-04-03 NOTE — Anesthesia Postprocedure Evaluation (Signed)
Anesthesia Post Note  Patient: [name]  Procedure(s) Performed: XI ROBOTIC ASSISTED LAPAROSCOPIC RADICAL NEPHRECTOMY (Right )     Patient location during evaluation: PACU Anesthesia Type: General Level of consciousness: awake and alert and patient cooperative Pain management: pain level controlled Vital Signs Assessment: post-procedure vital signs reviewed and stable Respiratory status: spontaneous breathing, nonlabored ventilation, respiratory function stable and patient connected to nasal cannula oxygen Cardiovascular status: blood pressure returned to baseline and stable Postop Assessment: no apparent nausea or vomiting Anesthetic complications: no   No complications documented.  Last Vitals:  Vitals:   04/03/21 1200 04/03/21 1221  BP: (!) 163/91 (!) 171/84  Pulse: 77 76  Resp: 14 15  Temp: 36.7 C (!) 36.3 C  SpO2: 100% 100%    Last Pain:  Vitals:   04/03/21 1221  TempSrc: Oral  PainSc:                  Eveny Anastas,E. Senna Lape

## 2021-04-04 ENCOUNTER — Encounter (HOSPITAL_COMMUNITY): Payer: Self-pay | Admitting: Urology

## 2021-04-04 LAB — SURGICAL PATHOLOGY

## 2021-04-04 LAB — HEMOGLOBIN AND HEMATOCRIT, BLOOD
HCT: 38.9 % (ref 36.0–46.0)
Hemoglobin: 12.6 g/dL (ref 12.0–15.0)

## 2021-04-04 LAB — BASIC METABOLIC PANEL
Anion gap: 9 (ref 5–15)
BUN: 17 mg/dL (ref 8–23)
CO2: 27 mmol/L (ref 22–32)
Calcium: 8.6 mg/dL — ABNORMAL LOW (ref 8.9–10.3)
Chloride: 102 mmol/L (ref 98–111)
Creatinine, Ser: 1.17 mg/dL — ABNORMAL HIGH (ref 0.44–1.00)
GFR, Estimated: 49 mL/min — ABNORMAL LOW (ref >60)
Glucose, Bld: 125 mg/dL — ABNORMAL HIGH (ref 70–99)
Potassium: 3.6 mmol/L (ref 3.5–5.1)
Sodium: 138 mmol/L (ref 135–145)

## 2021-04-04 MED ORDER — DOCUSATE SODIUM 100 MG PO CAPS: 100.0000 mg | ORAL_CAPSULE | Freq: Every day | ORAL | 2 refills | Status: DC | PRN

## 2021-04-04 NOTE — Progress Notes (Signed)
Patient discharged home.  IV removed - WNL.  Reviewed AVS and medications with patient and husband,  Emphasized importance of ambulating frequently to prevent blood clots and preventing constipation, esp with taking narcotic for pain management - instructed on colace use.   Advised when to call MD for s/s infection.  Patient and husband verbalize understanding.  No questions at this time.  Patient assisted to [facility] and taken to private vehicle in [place].

## 2021-04-04 NOTE — Plan of Care (Signed)
  Problem: Clinical Measurements: Goal: Ability to maintain clinical measurements within normal limits will improve Outcome: Progressing Goal: Will remain free from infection Outcome: Progressing Goal: Cardiovascular complication will be avoided Outcome: Progressing   Problem: Pain Managment: Goal: General experience of comfort will improve Outcome: Progressing

## 2021-04-04 NOTE — Discharge Summary (Signed)
Date of admission: 04/03/2021  Date of discharge: 04/04/2021  Admission diagnosis:  1.  Chronic right hydronephrosis 2. Atrophic right kidney 3. Chronic right flank pain  Discharge diagnosis:  1.  Chronic right hydronephrosis 2. Atrophic right kidney 3. Chronic right flank pain  History and Physical: For full details, please see admission history and physical. Briefly, [name] is a 75 y.o. year old patient with chronic right hydronephrosis, atrophic right kidney and chronic right flank pain who presented for a laparoscopic right nephrectomy.   Hospital Course: The patient recovered in the usual expected fashion.  She had her diet advanced slowly.  Initially managed with IV pain control, then transitioned to PO meds when she was tolerating oral intake.  Her labs were stable throughout the hospital course.  She was discharged to home on POD#1.  At the time of discharge she was tolerating a regular diet, passing flatus, ambulating, had adequate pain control and was agreeable to discharge.  Follow up as scheduled.    Laboratory values:  Recent Labs    04/03/21 1107 04/04/21 0506  HGB 13.4 12.6  HCT 42.9 38.9   Recent Labs    04/04/21 0506  CREATININE 1.17*    Disposition: Home  Discharge instruction: The patient was instructed to be ambulatory but told to refrain from heavy lifting, strenuous activity, or driving.   Discharge medications:  Allergies as of 04/04/2021      Reactions   Botox [onabotulinumtoxina] Anaphylaxis   Sulfa Antibiotics Other (See Comments)   UNSPECIFIED REACTION of CHILDHOOD   Ciprofloxacin Other (See Comments)   Yeast infections.   Gabapentin Other (See Comments)   Changes in patient mood      Medication List    STOP taking these medications   aspirin 325 MG EC tablet   bismuth subsalicylate 536 UY/40HK suspension Commonly known as: PEPTO BISMOL   Melatonin 10 MG Caps   oxyCODONE-acetaminophen 5-325 MG tablet Commonly known as:  Percocet     TAKE these medications   atorvastatin 40 MG tablet Commonly known as: LIPITOR Take 40 mg by mouth at bedtime.   clonazePAM 1 MG tablet Commonly known as: KLONOPIN Take 1 mg by mouth daily as needed for anxiety.   cyclobenzaprine 10 MG tablet Commonly known as: FLEXERIL Take 1 tablet (10 mg total) by mouth 3 (three) times daily as needed for muscle spasms.   docusate sodium 100 MG capsule Commonly known as: Colace Take 1 capsule (100 mg total) by mouth daily as needed.   hydrochlorothiazide 25 MG tablet Commonly known as: HYDRODIURIL Take 25 mg by mouth daily.   HYDROcodone-acetaminophen 10-325 MG tablet Commonly known as: NORCO Take 1 tablet by mouth every 4 (four) hours as needed for moderate pain or severe pain. What changed: reasons to take this   levothyroxine 75 MCG tablet Commonly known as: SYNTHROID Take 75 mcg by mouth daily before breakfast.   omeprazole 40 MG capsule Commonly known as: PRILOSEC Take 40 mg by mouth at bedtime.   ondansetron 4 MG tablet Commonly known as: Zofran Take 1 tablet (4 mg total) by mouth every 8 (eight) hours as needed for nausea or vomiting.   raloxifene 60 MG tablet Commonly known as: EVISTA Take 60 mg by mouth every evening.   vortioxetine HBr 20 MG Tabs tablet Commonly known as: TRINTELLIX Take 20 mg by mouth every morning.   zolpidem 10 MG tablet Commonly known as: AMBIEN Take 10 mg by mouth at bedtime.  Followup:   Follow-up Information    Karen Kays, NP On 04/17/2021.   Specialty: Nurse Practitioner Why: at 8:00 Contact information: [place] [address] [address]. [facility]  Pager: [phone]

## 2021-04-17 DIAGNOSIS — N130 Hydronephrosis with ureteropelvic junction obstruction: Secondary | ICD-10-CM | POA: Diagnosis not present

## 2021-05-22 DIAGNOSIS — Z5181 Encounter for therapeutic drug level monitoring: Secondary | ICD-10-CM | POA: Diagnosis not present

## 2021-05-22 DIAGNOSIS — Z79899 Other long term (current) drug therapy: Secondary | ICD-10-CM | POA: Diagnosis not present

## 2021-05-22 DIAGNOSIS — G894 Chronic pain syndrome: Secondary | ICD-10-CM | POA: Diagnosis not present

## 2021-05-30 DIAGNOSIS — M5459 Other low back pain: Secondary | ICD-10-CM | POA: Diagnosis not present

## 2021-06-06 DIAGNOSIS — R1030 Lower abdominal pain, unspecified: Secondary | ICD-10-CM | POA: Diagnosis not present

## 2021-06-06 DIAGNOSIS — R634 Abnormal weight loss: Secondary | ICD-10-CM | POA: Diagnosis not present

## 2021-06-06 DIAGNOSIS — R1013 Epigastric pain: Secondary | ICD-10-CM | POA: Diagnosis not present

## 2021-06-06 DIAGNOSIS — K5900 Constipation, unspecified: Secondary | ICD-10-CM | POA: Diagnosis not present

## 2021-06-06 DIAGNOSIS — Z86010 Personal history of colonic polyps: Secondary | ICD-10-CM | POA: Diagnosis not present

## 2021-06-07 DIAGNOSIS — F3132 Bipolar disorder, current episode depressed, moderate: Secondary | ICD-10-CM | POA: Diagnosis not present

## 2021-06-07 DIAGNOSIS — F411 Generalized anxiety disorder: Secondary | ICD-10-CM | POA: Diagnosis not present

## 2021-06-07 DIAGNOSIS — F5105 Insomnia due to other mental disorder: Secondary | ICD-10-CM | POA: Diagnosis not present

## 2021-06-15 ENCOUNTER — Other Ambulatory Visit: Payer: Self-pay | Admitting: Gastroenterology

## 2021-06-15 ENCOUNTER — Ambulatory Visit
Admission: RE | Admit: 2021-06-15 | Discharge: 2021-06-15 | Disposition: A | Discharge status: Home / Self Care | Payer: Medicare HMO | Source: Ambulatory Visit | Attending: Gastroenterology | Admitting: Gastroenterology

## 2021-06-15 ENCOUNTER — Other Ambulatory Visit: Payer: Self-pay

## 2021-06-15 DIAGNOSIS — K5900 Constipation, unspecified: Secondary | ICD-10-CM | POA: Diagnosis not present

## 2021-06-30 ENCOUNTER — Other Ambulatory Visit: Payer: Self-pay | Admitting: Gastroenterology

## 2021-06-30 ENCOUNTER — Ambulatory Visit
Admission: RE | Admit: 2021-06-30 | Discharge: 2021-06-30 | Disposition: A | Discharge status: Home / Self Care | Payer: Medicare HMO | Source: Ambulatory Visit | Attending: Gastroenterology | Admitting: Gastroenterology

## 2021-06-30 ENCOUNTER — Other Ambulatory Visit: Payer: Self-pay

## 2021-06-30 DIAGNOSIS — R109 Unspecified abdominal pain: Secondary | ICD-10-CM

## 2021-07-04 DIAGNOSIS — R1030 Lower abdominal pain, unspecified: Secondary | ICD-10-CM | POA: Diagnosis not present

## 2021-07-04 DIAGNOSIS — R1013 Epigastric pain: Secondary | ICD-10-CM | POA: Diagnosis not present

## 2021-07-04 DIAGNOSIS — K5903 Drug induced constipation: Secondary | ICD-10-CM | POA: Diagnosis not present

## 2021-07-12 DIAGNOSIS — F411 Generalized anxiety disorder: Secondary | ICD-10-CM | POA: Diagnosis not present

## 2021-07-12 DIAGNOSIS — F5105 Insomnia due to other mental disorder: Secondary | ICD-10-CM | POA: Diagnosis not present

## 2021-07-12 DIAGNOSIS — F3132 Bipolar disorder, current episode depressed, moderate: Secondary | ICD-10-CM | POA: Diagnosis not present

## 2021-07-18 DIAGNOSIS — N270 Small kidney, unilateral: Secondary | ICD-10-CM | POA: Diagnosis not present

## 2021-07-18 DIAGNOSIS — N130 Hydronephrosis with ureteropelvic junction obstruction: Secondary | ICD-10-CM | POA: Diagnosis not present

## 2021-08-24 DIAGNOSIS — Z0100 Encounter for examination of eyes and vision without abnormal findings: Secondary | ICD-10-CM | POA: Diagnosis not present

## 2021-08-24 DIAGNOSIS — H2513 Age-related nuclear cataract, bilateral: Secondary | ICD-10-CM | POA: Diagnosis not present

## 2021-09-11 ENCOUNTER — Encounter: Payer: Self-pay | Admitting: Ophthalmology

## 2021-09-11 DIAGNOSIS — H2511 Age-related nuclear cataract, right eye: Secondary | ICD-10-CM | POA: Diagnosis not present

## 2021-09-12 DIAGNOSIS — E039 Hypothyroidism, unspecified: Secondary | ICD-10-CM | POA: Diagnosis not present

## 2021-09-12 DIAGNOSIS — F322 Major depressive disorder, single episode, severe without psychotic features: Secondary | ICD-10-CM | POA: Diagnosis not present

## 2021-09-12 DIAGNOSIS — E782 Mixed hyperlipidemia: Secondary | ICD-10-CM | POA: Diagnosis not present

## 2021-09-12 DIAGNOSIS — R7301 Impaired fasting glucose: Secondary | ICD-10-CM | POA: Diagnosis not present

## 2021-09-12 DIAGNOSIS — N183 Chronic kidney disease, stage 3 unspecified: Secondary | ICD-10-CM | POA: Diagnosis not present

## 2021-09-12 DIAGNOSIS — Z23 Encounter for immunization: Secondary | ICD-10-CM | POA: Diagnosis not present

## 2021-09-12 DIAGNOSIS — I129 Hypertensive chronic kidney disease with stage 1 through stage 4 chronic kidney disease, or unspecified chronic kidney disease: Secondary | ICD-10-CM | POA: Diagnosis not present

## 2021-09-12 DIAGNOSIS — R296 Repeated falls: Secondary | ICD-10-CM | POA: Diagnosis not present

## 2021-09-14 DIAGNOSIS — F419 Anxiety disorder, unspecified: Secondary | ICD-10-CM | POA: Diagnosis not present

## 2021-09-14 DIAGNOSIS — F32A Depression, unspecified: Secondary | ICD-10-CM | POA: Diagnosis not present

## 2021-09-14 DIAGNOSIS — G4733 Obstructive sleep apnea (adult) (pediatric): Secondary | ICD-10-CM | POA: Diagnosis not present

## 2021-09-14 DIAGNOSIS — M545 Low back pain, unspecified: Secondary | ICD-10-CM | POA: Diagnosis not present

## 2021-09-14 DIAGNOSIS — E039 Hypothyroidism, unspecified: Secondary | ICD-10-CM | POA: Diagnosis not present

## 2021-09-14 DIAGNOSIS — G8929 Other chronic pain: Secondary | ICD-10-CM | POA: Diagnosis not present

## 2021-09-14 DIAGNOSIS — I129 Hypertensive chronic kidney disease with stage 1 through stage 4 chronic kidney disease, or unspecified chronic kidney disease: Secondary | ICD-10-CM | POA: Diagnosis not present

## 2021-09-14 DIAGNOSIS — R7303 Prediabetes: Secondary | ICD-10-CM | POA: Diagnosis not present

## 2021-09-14 DIAGNOSIS — N183 Chronic kidney disease, stage 3 unspecified: Secondary | ICD-10-CM | POA: Diagnosis not present

## 2021-09-22 DIAGNOSIS — M545 Low back pain, unspecified: Secondary | ICD-10-CM | POA: Diagnosis not present

## 2021-09-22 DIAGNOSIS — N183 Chronic kidney disease, stage 3 unspecified: Secondary | ICD-10-CM | POA: Diagnosis not present

## 2021-09-22 DIAGNOSIS — E039 Hypothyroidism, unspecified: Secondary | ICD-10-CM | POA: Diagnosis not present

## 2021-09-22 DIAGNOSIS — R7303 Prediabetes: Secondary | ICD-10-CM | POA: Diagnosis not present

## 2021-09-22 DIAGNOSIS — F419 Anxiety disorder, unspecified: Secondary | ICD-10-CM | POA: Diagnosis not present

## 2021-09-22 DIAGNOSIS — I129 Hypertensive chronic kidney disease with stage 1 through stage 4 chronic kidney disease, or unspecified chronic kidney disease: Secondary | ICD-10-CM | POA: Diagnosis not present

## 2021-09-22 DIAGNOSIS — G8929 Other chronic pain: Secondary | ICD-10-CM | POA: Diagnosis not present

## 2021-09-22 DIAGNOSIS — G4733 Obstructive sleep apnea (adult) (pediatric): Secondary | ICD-10-CM | POA: Diagnosis not present

## 2021-09-22 DIAGNOSIS — F32A Depression, unspecified: Secondary | ICD-10-CM | POA: Diagnosis not present

## 2021-09-25 ENCOUNTER — Other Ambulatory Visit: Payer: Self-pay

## 2021-09-25 ENCOUNTER — Encounter
Admission: RE | Disposition: A | Discharge status: Home / Self Care | Payer: Self-pay | Source: Home / Self Care | Attending: Ophthalmology

## 2021-09-25 ENCOUNTER — Ambulatory Visit: Payer: Medicare HMO | Admitting: Anesthesiology

## 2021-09-25 ENCOUNTER — Ambulatory Visit
Admission: RE | Admit: 2021-09-25 | Discharge: 2021-09-25 | Disposition: A | Discharge status: Home / Self Care | Payer: Medicare HMO | Attending: Ophthalmology | Admitting: Ophthalmology

## 2021-09-25 DIAGNOSIS — Z87891 Personal history of nicotine dependence: Secondary | ICD-10-CM | POA: Diagnosis not present

## 2021-09-25 DIAGNOSIS — Z96611 Presence of right artificial shoulder joint: Secondary | ICD-10-CM | POA: Insufficient documentation

## 2021-09-25 DIAGNOSIS — Z888 Allergy status to other drugs, medicaments and biological substances status: Secondary | ICD-10-CM | POA: Insufficient documentation

## 2021-09-25 DIAGNOSIS — Z9682 Presence of neurostimulator: Secondary | ICD-10-CM | POA: Diagnosis not present

## 2021-09-25 DIAGNOSIS — Z882 Allergy status to sulfonamides status: Secondary | ICD-10-CM | POA: Insufficient documentation

## 2021-09-25 DIAGNOSIS — Z79899 Other long term (current) drug therapy: Secondary | ICD-10-CM | POA: Insufficient documentation

## 2021-09-25 DIAGNOSIS — Z79890 Hormone replacement therapy (postmenopausal): Secondary | ICD-10-CM | POA: Insufficient documentation

## 2021-09-25 DIAGNOSIS — Z96651 Presence of right artificial knee joint: Secondary | ICD-10-CM | POA: Insufficient documentation

## 2021-09-25 DIAGNOSIS — H25811 Combined forms of age-related cataract, right eye: Secondary | ICD-10-CM | POA: Diagnosis not present

## 2021-09-25 DIAGNOSIS — H2511 Age-related nuclear cataract, right eye: Secondary | ICD-10-CM | POA: Diagnosis not present

## 2021-09-25 DIAGNOSIS — Z881 Allergy status to other antibiotic agents status: Secondary | ICD-10-CM | POA: Diagnosis not present

## 2021-09-25 HISTORY — PX: CATARACT EXTRACTION W/PHACO: SHX586

## 2021-09-25 SURGERY — PHACOEMULSIFICATION, CATARACT, WITH IOL INSERTION
Anesthesia: Monitor Anesthesia Care | Site: Eye | Laterality: Right

## 2021-09-25 MED ORDER — SIGHTPATH DOSE#1 BSS IO SOLN
INTRAOCULAR | Status: DC | PRN
  Administered 2021-09-25: 15 mL

## 2021-09-25 MED ORDER — SIGHTPATH DOSE#1 SODIUM HYALURONATE 10 MG/ML IO SOLUTION
PREFILLED_SYRINGE | INTRAOCULAR | Status: DC | PRN
  Administered 2021-09-25: 0.85 mL via INTRAOCULAR

## 2021-09-25 MED ORDER — SIGHTPATH DOSE#1 BSS IO SOLN
INTRAOCULAR | Status: DC | PRN
  Administered 2021-09-25: 67 mL via OPHTHALMIC

## 2021-09-25 MED ORDER — TETRACAINE HCL 0.5 % OP SOLN
1.0000 [drp] | OPHTHALMIC | Status: DC | PRN
  Administered 2021-09-25 (×3): 1 [drp] via OPHTHALMIC

## 2021-09-25 MED ORDER — SIGHTPATH DOSE#1 SODIUM HYALURONATE 23 MG/ML IO SOLUTION
PREFILLED_SYRINGE | INTRAOCULAR | Status: DC | PRN
  Administered 2021-09-25: .06 mL via INTRAOCULAR

## 2021-09-25 MED ORDER — ACETAMINOPHEN 160 MG/5ML PO SOLN: 325.0000 mg | Freq: Once | ORAL | Status: DC

## 2021-09-25 MED ORDER — ARMC OPHTHALMIC DILATING DROPS
1.0000 "application " | OPHTHALMIC | Status: DC | PRN
  Administered 2021-09-25 (×3): 1 via OPHTHALMIC

## 2021-09-25 MED ORDER — MIDAZOLAM HCL 2 MG/2ML IJ SOLN
INTRAMUSCULAR | Status: DC | PRN
  Administered 2021-09-25 (×2): 1 mg via INTRAVENOUS

## 2021-09-25 MED ORDER — MOXIFLOXACIN HCL 0.5 % OP SOLN
OPHTHALMIC | Status: DC | PRN
  Administered 2021-09-25: 0.2 mL via OPHTHALMIC

## 2021-09-25 MED ORDER — ACETAMINOPHEN 325 MG PO TABS: 325.0000 mg | ORAL_TABLET | Freq: Once | ORAL | Status: DC

## 2021-09-25 MED ORDER — LACTATED RINGERS IV SOLN: INTRAVENOUS | Status: DC

## 2021-09-25 MED ORDER — LIDOCAINE HCL (PF) 2 % IJ SOLN
INTRAOCULAR | Status: DC | PRN
  Administered 2021-09-25: 1 mL via INTRAOCULAR

## 2021-09-25 MED ORDER — FENTANYL CITRATE (PF) 100 MCG/2ML IJ SOLN
INTRAMUSCULAR | Status: DC | PRN
  Administered 2021-09-25 (×2): 50 ug via INTRAVENOUS

## 2021-09-25 SURGICAL SUPPLY — 14 items
CANNULA ANT/CHMB 27GA (MISCELLANEOUS) IMPLANT
DISSECTOR HYDRO NUCLEUS 50X22 (MISCELLANEOUS) ×2 IMPLANT
GLOVE SURG GAMMEX PI TX LF 7.5 (GLOVE) ×2 IMPLANT
GLOVE SURG SYN 8.5  E (GLOVE) ×2
GLOVE SURG SYN 8.5 E (GLOVE) ×1 IMPLANT
GOWN STRL REUS W/ TWL LRG LVL3 (GOWN DISPOSABLE) ×2 IMPLANT
GOWN STRL REUS W/TWL LRG LVL3 (GOWN DISPOSABLE) ×4
LENS IOL TECNIS EYHANCE 21.0 (Intraocular Lens) ×2 IMPLANT
MARKER SKIN DUAL TIP RULER LAB (MISCELLANEOUS) IMPLANT
PACK EYE AFTER SURG (MISCELLANEOUS) ×2 IMPLANT
SYR 3ML LL SCALE MARK (SYRINGE) ×2 IMPLANT
SYR TB 1ML LUER SLIP (SYRINGE) ×2 IMPLANT
WATER STERILE IRR 250ML POUR (IV SOLUTION) ×2 IMPLANT
WIPE NON LINTING 3.25X3.25 (MISCELLANEOUS) ×2 IMPLANT

## 2021-09-25 NOTE — H&P (Signed)
[facility]   Primary Care Physician:  [name], MD Ophthalmologist: Dr. Benay Pillow  Pre-Procedure History & Physical: HPI:  [name] is a 75 y.o. female here for cataract surgery.   Past Medical History:  Diagnosis Date   Anxiety    Arthritis    cerv & lumbar arthritis   Cervical dystonia    In remission   Chronic kidney disease    R cyst, obstruction- Dr. Lorrene Reid seen pt. 12/2017   Colitis    Dementia ([place])    Depression    [name] - sees counsellor, no meds.   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    Past Surgical History:  Procedure Laterality Date   ABDOMINAL HYSTERECTOMY     APPENDECTOMY     CHOLECYSTECTOMY     JOINT REPLACEMENT  2012   R knee    KNEE SURGERY     REVERSE SHOULDER ARTHROPLASTY Right 07/09/2019   Procedure: REVERSE SHOULDER ARTHROPLASTY;  Surgeon: Justice Britain, MD;  Location: [facility];  Service: Orthopedics;  Laterality: Right;  110min   ROBOT ASSISTED LAPAROSCOPIC NEPHRECTOMY Right 04/03/2021   Procedure: XI ROBOTIC ASSISTED LAPAROSCOPIC RADICAL NEPHRECTOMY;  Surgeon: Janith Lima, MD;  Location: [facility];  Service: Urology;  Laterality: Right;   ROTATOR CUFF REPAIR Right 2016   SPINAL CORD STIMULATOR BATTERY EXCHANGE N/A 08/20/2018   Procedure: SPINAL CORD STIMULATOR BATTERY EXCHANGE/REPLACE WITH RECHARGEABLE BATTERY;  Surgeon: Melina Schools, MD;  Location: [place];  Service: Orthopedics;  Laterality: N/A;   SPINAL CORD STIMULATOR INSERTION N/A 01/30/2018   Procedure: LUMBAR SPINAL CORD STIMULATOR INSERTION;  Surgeon: Melina Schools, MD;  Location: [place];  Service: Orthopedics;  Laterality: N/A;  2.5 hrs   TONSILLECTOMY      Prior to Admission medications   Medication Sig Start Date End Date Taking? Authorizing Provider   atorvastatin (LIPITOR) 40 MG tablet Take 40 mg by mouth at bedtime.    Yes [provider]  clonazePAM (KLONOPIN) 1 MG tablet Take 1 mg by mouth daily as needed for anxiety.   Yes [provider]  hydrochlorothiazide (HYDRODIURIL) 25 MG tablet Take 25 mg by mouth daily.   Yes [provider]  HYDROmorphone (DILAUDID) 4 MG tablet Take by mouth 4 (four) times daily.   Yes [provider]  levothyroxine (SYNTHROID, LEVOTHROID) 75 MCG tablet Take 75 mcg by mouth daily before breakfast.    Yes [provider]  omeprazole (PRILOSEC) 40 MG capsule Take 40 mg by mouth at bedtime.    Yes [provider]  raloxifene (EVISTA) 60 MG tablet Take 60 mg by mouth every evening.   Yes [provider]  vortioxetine HBr (TRINTELLIX) 20 MG TABS tablet Take 20 mg by mouth every morning.   Yes [provider]  zolpidem (AMBIEN) 10 MG tablet Take 10 mg by mouth at bedtime.   Yes [provider]  cyclobenzaprine (FLEXERIL) 10 MG tablet Take 1 tablet (10 mg total) by mouth 3 (three) times daily as needed for muscle spasms. Patient not taking: Reported on 03/16/2021 07/10/19   Shuford, Olivia Mackie, PA-C  ondansetron (ZOFRAN) 4 MG tablet Take 1 tablet (4 mg total) by mouth every 8 (eight) hours as needed for nausea or vomiting. Patient  not taking: No sig reported 07/10/19   Shuford, Olivia Mackie, PA-C    Allergies as of 08/28/2021 - Review Complete 04/03/2021  Allergen Reaction Noted   Botox [onabotulinumtoxina] Anaphylaxis 08/05/2018   Sulfa antibiotics Other (See Comments) 03/11/2012   Ciprofloxacin Other (See Comments) 01/21/2018   Gabapentin Other (See Comments) 01/21/2018    Family History  Problem Relation Age of Onset   Alzheimer's disease Mother    Heart disease Father     Social History   Socioeconomic History   Marital status: Married    Spouse name: Ronalee Belts   Number of children: 3   Years of education: college   Highest education  level: Not on file  Occupational History    Employer: RETIRED    Comment: retired  Tobacco Use   Smoking status: Former    Types: Cigarettes    Quit date: 1991    Years since quitting: 31.7   Smokeless tobacco: Never  Vaping Use   Vaping Use: Never used  Substance and Sexual Activity   Alcohol use: No   Drug use: No   Sexual activity: Not on file  Other Topics Concern   Not on file  Social History Narrative   Patient lives at home with her husband Ronalee Belts)    Retired    Right handed   Some college   Caffeine three cups daily   Social Determinants of Radio broadcast assistant Strain: Not on file  Food Insecurity: Not on file  Transportation Needs: Not on file  Physical Activity: Not on file  Stress: Not on file  Social Connections: Not on file  Intimate Partner Violence: Not on file    Review of Systems: See HPI, otherwise negative ROS  Physical Exam: BP (!) 161/102   Pulse 77   Temp 98.2 F (36.8 C) (Temporal)   Resp 18   Ht 5\' 3"  (1.6 m)   Wt 74.8 kg   SpO2 95%   BMI 29.23 kg/m  General:   Alert, cooperative in NAD Head:  Normocephalic and atraumatic. Respiratory:  Normal work of breathing. Cardiovascular:  RRR  Impression/Plan: [name] is here for cataract surgery.  Risks, benefits, limitations, and alternatives regarding cataract surgery have been reviewed with the patient.  Questions have been answered.  All parties agreeable.   Benay Pillow, MD  09/25/2021, 7:59 AM

## 2021-09-25 NOTE — Anesthesia Postprocedure Evaluation (Signed)
Anesthesia Post Note  Patient: [name]  Procedure(s) Performed: CATARACT EXTRACTION PHACO AND INTRAOCULAR LENS PLACEMENT (IOC) RIGHT 2.55 00:23.3 (Right: Eye)     Patient location during evaluation: PACU Anesthesia Type: MAC Level of consciousness: awake and alert and oriented Pain management: satisfactory to patient Vital Signs Assessment: post-procedure vital signs reviewed and stable Respiratory status: spontaneous breathing, nonlabored ventilation and respiratory function stable Cardiovascular status: blood pressure returned to baseline and stable Postop Assessment: Adequate PO intake and No signs of nausea or vomiting Anesthetic complications: no   No notable events documented.  Raliegh Ip

## 2021-09-25 NOTE — Transfer of Care (Signed)
Immediate Anesthesia Transfer of Care Note  Patient: [name]  Procedure(s) Performed: CATARACT EXTRACTION PHACO AND INTRAOCULAR LENS PLACEMENT (IOC) RIGHT 2.55 00:23.3 (Right: Eye)  Patient Location: PACU  Anesthesia Type: MAC  Level of Consciousness: awake, alert  and patient cooperative  Airway and Oxygen Therapy: Patient Spontanous Breathing and Patient connected to supplemental oxygen  Post-op Assessment: Post-op Vital signs reviewed, Patient's Cardiovascular Status Stable, Respiratory Function Stable, Patent Airway and No signs of Nausea or vomiting  Post-op Vital Signs: Reviewed and stable  Complications: No notable events documented.

## 2021-09-25 NOTE — Discharge Instructions (Signed)

## 2021-09-25 NOTE — Op Note (Signed)
OPERATIVE NOTE  [name] [id number] 09/25/2021   PREOPERATIVE DIAGNOSIS:  Nuclear sclerotic cataract right eye.  H25.11   POSTOPERATIVE DIAGNOSIS:    Nuclear sclerotic cataract right eye.     PROCEDURE:  Phacoemusification with posterior chamber intraocular lens placement of the right eye   LENS:   Implant Name Type Inv. Item Serial No. Manufacturer Lot No. LRB No. Used Action  LENS IOL TECNIS EYHANCE 21.0 - [id number] Intraocular Lens LENS IOL TECNIS EYHANCE 21.0 [id number] [id number]   Right 1 Implanted       Procedure(s): CATARACT EXTRACTION PHACO AND INTRAOCULAR LENS PLACEMENT (IOC) RIGHT 2.55 00:23.3 (Right)   SURGEON:  Benay Pillow, MD, MPH  ANESTHESIOLOGIST: Anesthesiologist: Ronelle Nigh, MD CRNA: Silvana Newness, CRNA   ANESTHESIA:  Topical with tetracaine drops augmented with 1% preservative-free intracameral lidocaine.  ESTIMATED BLOOD LOSS: less than 1 mL.   COMPLICATIONS:  None.   DESCRIPTION OF PROCEDURE:  The patient was identified in the holding room and transported to the operating room and placed in the supine position under the operating microscope.  The right eye was identified as the operative eye and it was prepped and draped in the usual sterile ophthalmic fashion.   A 1.0 millimeter clear-corneal paracentesis was made at the 10:30 position. 0.5 ml of preservative-free 1% lidocaine with epinephrine was injected into the anterior chamber.  The anterior chamber was filled with Healon 5 viscoelastic.  A 2.4 millimeter keratome was used to make a near-clear corneal incision at the 8:00 position.  A curvilinear capsulorrhexis was made with a cystotome and capsulorrhexis forceps.  Balanced salt solution was used to hydrodissect and hydrodelineate the nucleus.   Phacoemulsification was then used in stop and chop fashion to remove the lens nucleus and epinucleus.  The remaining cortex was then removed using the irrigation and aspiration handpiece. Healon  was then placed into the capsular bag to distend it for lens placement.  A lens was then injected into the capsular bag.  The remaining viscoelastic was aspirated.   Wounds were hydrated with balanced salt solution.  The anterior chamber was inflated to a physiologic pressure with balanced salt solution.   Intracameral vigamox 0.1 mL undiluted was injected into the eye and a drop placed onto the ocular surface.  No wound leaks were noted.  The patient was taken to the recovery room in stable condition without complications of anesthesia or surgery  Benay Pillow 09/25/2021, 8:31 AM

## 2021-09-25 NOTE — Anesthesia Preprocedure Evaluation (Signed)
Anesthesia Evaluation  Patient identified by MRN, date of birth, ID band Patient awake    Reviewed: Allergy & Precautions, H&P , NPO status , Patient's Chart, lab work & pertinent test results  Airway Mallampati: II  TM Distance: >3 FB Neck ROM: full    Dental no notable dental hx.    Pulmonary sleep apnea , former smoker,    Pulmonary exam normal breath sounds clear to auscultation       Cardiovascular hypertension, Normal cardiovascular exam Rhythm:regular Rate:Normal     Neuro/Psych PSYCHIATRIC DISORDERS  Neuromuscular disease    GI/Hepatic GERD  ,  Endo/Other  Hypothyroidism   Renal/GU Renal disease     Musculoskeletal   Abdominal   Peds  Hematology   Anesthesia Other Findings   Reproductive/Obstetrics                             Anesthesia Physical Anesthesia Plan  ASA: 3  Anesthesia Plan: MAC   Post-op Pain Management:    Induction:   PONV Risk Score and Plan: 2 and Treatment may vary due to age or medical condition, TIVA and Midazolam  Airway Management Planned:   Additional Equipment:   Intra-op Plan:   Post-operative Plan:   Informed Consent: I have reviewed the patients History and Physical, chart, labs and discussed the procedure including the risks, benefits and alternatives for the proposed anesthesia with the patient or authorized representative who has indicated his/her understanding and acceptance.     Dental Advisory Given  Plan Discussed with: CRNA  Anesthesia Plan Comments:         Anesthesia Quick Evaluation

## 2021-09-26 ENCOUNTER — Encounter: Payer: Self-pay | Admitting: Ophthalmology

## 2021-09-28 DIAGNOSIS — R7303 Prediabetes: Secondary | ICD-10-CM | POA: Diagnosis not present

## 2021-09-28 DIAGNOSIS — F32A Depression, unspecified: Secondary | ICD-10-CM | POA: Diagnosis not present

## 2021-09-28 DIAGNOSIS — I129 Hypertensive chronic kidney disease with stage 1 through stage 4 chronic kidney disease, or unspecified chronic kidney disease: Secondary | ICD-10-CM | POA: Diagnosis not present

## 2021-09-28 DIAGNOSIS — N183 Chronic kidney disease, stage 3 unspecified: Secondary | ICD-10-CM | POA: Diagnosis not present

## 2021-09-28 DIAGNOSIS — M545 Low back pain, unspecified: Secondary | ICD-10-CM | POA: Diagnosis not present

## 2021-09-28 DIAGNOSIS — F419 Anxiety disorder, unspecified: Secondary | ICD-10-CM | POA: Diagnosis not present

## 2021-09-28 DIAGNOSIS — E039 Hypothyroidism, unspecified: Secondary | ICD-10-CM | POA: Diagnosis not present

## 2021-09-28 DIAGNOSIS — G4733 Obstructive sleep apnea (adult) (pediatric): Secondary | ICD-10-CM | POA: Diagnosis not present

## 2021-09-28 DIAGNOSIS — G8929 Other chronic pain: Secondary | ICD-10-CM | POA: Diagnosis not present

## 2021-10-04 DIAGNOSIS — H2512 Age-related nuclear cataract, left eye: Secondary | ICD-10-CM | POA: Diagnosis not present

## 2021-10-11 DIAGNOSIS — K648 Other hemorrhoids: Secondary | ICD-10-CM | POA: Diagnosis not present

## 2021-10-11 DIAGNOSIS — R1013 Epigastric pain: Secondary | ICD-10-CM | POA: Diagnosis not present

## 2021-10-11 DIAGNOSIS — Z86010 Personal history of colonic polyps: Secondary | ICD-10-CM | POA: Diagnosis not present

## 2021-10-11 DIAGNOSIS — D123 Benign neoplasm of transverse colon: Secondary | ICD-10-CM | POA: Diagnosis not present

## 2021-10-11 DIAGNOSIS — K449 Diaphragmatic hernia without obstruction or gangrene: Secondary | ICD-10-CM | POA: Diagnosis not present

## 2021-10-11 DIAGNOSIS — K2930 Chronic superficial gastritis without bleeding: Secondary | ICD-10-CM | POA: Diagnosis not present

## 2021-10-11 DIAGNOSIS — K5730 Diverticulosis of large intestine without perforation or abscess without bleeding: Secondary | ICD-10-CM | POA: Diagnosis not present

## 2021-10-12 NOTE — Discharge Instructions (Signed)

## 2021-10-16 ENCOUNTER — Encounter: Payer: Self-pay | Admitting: Ophthalmology

## 2021-10-16 ENCOUNTER — Other Ambulatory Visit: Payer: Self-pay

## 2021-10-16 ENCOUNTER — Encounter
Admission: RE | Disposition: A | Discharge status: Home / Self Care | Payer: Self-pay | Source: Home / Self Care | Attending: Ophthalmology

## 2021-10-16 ENCOUNTER — Ambulatory Visit: Payer: Medicare HMO | Admitting: Anesthesiology

## 2021-10-16 ENCOUNTER — Ambulatory Visit
Admission: RE | Admit: 2021-10-16 | Discharge: 2021-10-16 | Disposition: A | Discharge status: Home / Self Care | Payer: Medicare HMO | Attending: Ophthalmology | Admitting: Ophthalmology

## 2021-10-16 DIAGNOSIS — Z79899 Other long term (current) drug therapy: Secondary | ICD-10-CM | POA: Diagnosis not present

## 2021-10-16 DIAGNOSIS — H25812 Combined forms of age-related cataract, left eye: Secondary | ICD-10-CM | POA: Diagnosis not present

## 2021-10-16 DIAGNOSIS — K219 Gastro-esophageal reflux disease without esophagitis: Secondary | ICD-10-CM | POA: Insufficient documentation

## 2021-10-16 DIAGNOSIS — Z881 Allergy status to other antibiotic agents status: Secondary | ICD-10-CM | POA: Insufficient documentation

## 2021-10-16 DIAGNOSIS — N189 Chronic kidney disease, unspecified: Secondary | ICD-10-CM | POA: Diagnosis not present

## 2021-10-16 DIAGNOSIS — Z888 Allergy status to other drugs, medicaments and biological substances status: Secondary | ICD-10-CM | POA: Insufficient documentation

## 2021-10-16 DIAGNOSIS — G4730 Sleep apnea, unspecified: Secondary | ICD-10-CM | POA: Diagnosis not present

## 2021-10-16 DIAGNOSIS — Z79890 Hormone replacement therapy (postmenopausal): Secondary | ICD-10-CM | POA: Diagnosis not present

## 2021-10-16 DIAGNOSIS — Z87891 Personal history of nicotine dependence: Secondary | ICD-10-CM | POA: Diagnosis not present

## 2021-10-16 DIAGNOSIS — E039 Hypothyroidism, unspecified: Secondary | ICD-10-CM | POA: Diagnosis not present

## 2021-10-16 DIAGNOSIS — H2512 Age-related nuclear cataract, left eye: Secondary | ICD-10-CM | POA: Insufficient documentation

## 2021-10-16 DIAGNOSIS — I129 Hypertensive chronic kidney disease with stage 1 through stage 4 chronic kidney disease, or unspecified chronic kidney disease: Secondary | ICD-10-CM | POA: Insufficient documentation

## 2021-10-16 DIAGNOSIS — Z882 Allergy status to sulfonamides status: Secondary | ICD-10-CM | POA: Insufficient documentation

## 2021-10-16 DIAGNOSIS — D123 Benign neoplasm of transverse colon: Secondary | ICD-10-CM | POA: Diagnosis not present

## 2021-10-16 DIAGNOSIS — K2930 Chronic superficial gastritis without bleeding: Secondary | ICD-10-CM | POA: Diagnosis not present

## 2021-10-16 HISTORY — PX: CATARACT EXTRACTION W/PHACO: SHX586

## 2021-10-16 SURGERY — PHACOEMULSIFICATION, CATARACT, WITH IOL INSERTION
Anesthesia: Monitor Anesthesia Care | Site: Eye | Laterality: Left

## 2021-10-16 MED ORDER — ACETAMINOPHEN 325 MG PO TABS: 325.0000 mg | ORAL_TABLET | Freq: Once | ORAL | Status: DC

## 2021-10-16 MED ORDER — ACETAMINOPHEN 160 MG/5ML PO SOLN: 325.0000 mg | Freq: Once | ORAL | Status: DC

## 2021-10-16 MED ORDER — TETRACAINE HCL 0.5 % OP SOLN
1.0000 [drp] | OPHTHALMIC | Status: DC | PRN
  Administered 2021-10-16 (×3): 1 [drp] via OPHTHALMIC

## 2021-10-16 MED ORDER — ARMC OPHTHALMIC DILATING DROPS
1.0000 "application " | OPHTHALMIC | Status: DC | PRN
  Administered 2021-10-16 (×3): 1 via OPHTHALMIC

## 2021-10-16 MED ORDER — SIGHTPATH DOSE#1 BSS IO SOLN
INTRAOCULAR | Status: DC | PRN
  Administered 2021-10-16: 76 mL via OPHTHALMIC

## 2021-10-16 MED ORDER — LIDOCAINE HCL (PF) 2 % IJ SOLN
INTRAOCULAR | Status: DC | PRN
  Administered 2021-10-16: 1 mL via INTRAOCULAR

## 2021-10-16 MED ORDER — SIGHTPATH DOSE#1 SODIUM HYALURONATE 10 MG/ML IO SOLUTION
PREFILLED_SYRINGE | INTRAOCULAR | Status: DC | PRN
  Administered 2021-10-16: 0.85 mL via INTRAOCULAR

## 2021-10-16 MED ORDER — MIDAZOLAM HCL 2 MG/2ML IJ SOLN
INTRAMUSCULAR | Status: DC | PRN
  Administered 2021-10-16: 2 mg via INTRAVENOUS

## 2021-10-16 MED ORDER — SIGHTPATH DOSE#1 BSS IO SOLN
INTRAOCULAR | Status: DC | PRN
  Administered 2021-10-16: 15 mL

## 2021-10-16 MED ORDER — SIGHTPATH DOSE#1 SODIUM HYALURONATE 23 MG/ML IO SOLUTION
PREFILLED_SYRINGE | INTRAOCULAR | Status: DC | PRN
  Administered 2021-10-16: 0.6 mL via INTRAOCULAR

## 2021-10-16 MED ORDER — MOXIFLOXACIN HCL 0.5 % OP SOLN
OPHTHALMIC | Status: DC | PRN
  Administered 2021-10-16: 0.2 mL via OPHTHALMIC

## 2021-10-16 MED ORDER — LACTATED RINGERS IV SOLN: INTRAVENOUS | Status: DC

## 2021-10-16 MED ORDER — FENTANYL CITRATE (PF) 100 MCG/2ML IJ SOLN
INTRAMUSCULAR | Status: DC | PRN
  Administered 2021-10-16 (×2): 50 ug via INTRAVENOUS

## 2021-10-16 SURGICAL SUPPLY — 14 items
CANNULA ANT/CHMB 27GA (MISCELLANEOUS) ×2 IMPLANT
DISSECTOR HYDRO NUCLEUS 50X22 (MISCELLANEOUS) ×2 IMPLANT
GLOVE SURG GAMMEX PI TX LF 7.5 (GLOVE) ×2 IMPLANT
GLOVE SURG SYN 8.5  E (GLOVE) ×2
GLOVE SURG SYN 8.5 E (GLOVE) ×1 IMPLANT
GOWN STRL REUS W/ TWL LRG LVL3 (GOWN DISPOSABLE) ×2 IMPLANT
GOWN STRL REUS W/TWL LRG LVL3 (GOWN DISPOSABLE) ×4
LENS IOL TECNIS EYHANCE 22.0 (Intraocular Lens) ×2 IMPLANT
MARKER SKIN DUAL TIP RULER LAB (MISCELLANEOUS) ×2 IMPLANT
PACK EYE AFTER SURG (MISCELLANEOUS) ×2 IMPLANT
SYR 3ML LL SCALE MARK (SYRINGE) ×2 IMPLANT
SYR TB 1ML LUER SLIP (SYRINGE) ×2 IMPLANT
WATER STERILE IRR 250ML POUR (IV SOLUTION) ×2 IMPLANT
WIPE NON LINTING 3.25X3.25 (MISCELLANEOUS) ×2 IMPLANT

## 2021-10-16 NOTE — H&P (Signed)
[facility]   Primary Care Physician:  [name], MD Ophthalmologist: Dr. Benay Pillow  Pre-Procedure History & Physical: HPI:  [name] is a 75 y.o. female here for cataract surgery.   Past Medical History:  Diagnosis Date   Anxiety    Arthritis    cerv & lumbar arthritis   Cervical dystonia    In remission   Chronic kidney disease    R cyst, obstruction- Dr. Lorrene Reid seen pt. 12/2017   Colitis    Dementia ([place])    Depression    [name] - sees counsellor, no meds.   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    Past Surgical History:  Procedure Laterality Date   ABDOMINAL HYSTERECTOMY     APPENDECTOMY     CATARACT EXTRACTION W/PHACO Right 09/25/2021   Procedure: CATARACT EXTRACTION PHACO AND INTRAOCULAR LENS PLACEMENT (IOC) RIGHT 2.55 00:23.3;  Surgeon: Eulogio Bear, MD;  Location: [place];  Service: Ophthalmology;  Laterality: Right;   CHOLECYSTECTOMY     JOINT REPLACEMENT  2012   R knee    KNEE SURGERY     REVERSE SHOULDER ARTHROPLASTY Right 07/09/2019   Procedure: REVERSE SHOULDER ARTHROPLASTY;  Surgeon: Justice Britain, MD;  Location: [facility];  Service: Orthopedics;  Laterality: Right;  133min   ROBOT ASSISTED LAPAROSCOPIC NEPHRECTOMY Right 04/03/2021   Procedure: XI ROBOTIC ASSISTED LAPAROSCOPIC RADICAL NEPHRECTOMY;  Surgeon: Janith Lima, MD;  Location: [facility];  Service: Urology;  Laterality: Right;   ROTATOR CUFF REPAIR Right 2016   SPINAL CORD STIMULATOR BATTERY EXCHANGE N/A 08/20/2018   Procedure: SPINAL CORD STIMULATOR BATTERY EXCHANGE/REPLACE WITH RECHARGEABLE BATTERY;  Surgeon: Melina Schools, MD;  Location: [place];  Service: Orthopedics;  Laterality: N/A;   SPINAL CORD STIMULATOR INSERTION N/A 01/30/2018   Procedure: LUMBAR  SPINAL CORD STIMULATOR INSERTION;  Surgeon: Melina Schools, MD;  Location: [place];  Service: Orthopedics;  Laterality: N/A;  2.5 hrs   TONSILLECTOMY      Prior to Admission medications   Medication Sig Start Date End Date Taking? Authorizing Provider  atorvastatin (LIPITOR) 40 MG tablet Take 40 mg by mouth at bedtime.    Yes [provider]  clonazePAM (KLONOPIN) 1 MG tablet Take 1 mg by mouth daily as needed for anxiety.   Yes [provider]  hydrochlorothiazide (HYDRODIURIL) 25 MG tablet Take 25 mg by mouth daily.   Yes [provider]  HYDROmorphone (DILAUDID) 4 MG tablet Take by mouth 4 (four) times daily.   Yes [provider]  levothyroxine (SYNTHROID, LEVOTHROID) 75 MCG tablet Take 75 mcg by mouth daily before breakfast.    Yes [provider]  omeprazole (PRILOSEC) 40 MG capsule Take 40 mg by mouth at bedtime.    Yes [provider]  raloxifene (EVISTA) 60 MG tablet Take 60 mg by mouth every evening.   Yes [provider]  vortioxetine HBr (TRINTELLIX) 20 MG TABS tablet Take 20 mg by mouth every morning.   Yes [provider]  zolpidem (AMBIEN) 10 MG tablet Take 10 mg by mouth at bedtime.   Yes [provider]  cyclobenzaprine (FLEXERIL) 10 MG tablet Take 1 tablet (10 mg total) by mouth 3 (three) times daily as needed for muscle  spasms. Patient not taking: Reported on 03/16/2021 07/10/19   Shuford, Olivia Mackie, PA-C  ondansetron (ZOFRAN) 4 MG tablet Take 1 tablet (4 mg total) by mouth every 8 (eight) hours as needed for nausea or vomiting. Patient not taking: No sig reported 07/10/19   Shuford, Olivia Mackie, PA-C    Allergies as of 08/28/2021 - Review Complete 04/03/2021  Allergen Reaction Noted   Botox [onabotulinumtoxina] Anaphylaxis 08/05/2018   Sulfa antibiotics Other (See Comments) 03/11/2012   Ciprofloxacin Other (See Comments) 01/21/2018   Gabapentin Other (See Comments) 01/21/2018    Family History  Problem  Relation Age of Onset   Alzheimer's disease Mother    Heart disease Father     Social History   Socioeconomic History   Marital status: Married    Spouse name: Ronalee Belts   Number of children: 3   Years of education: college   Highest education level: Not on file  Occupational History    Employer: RETIRED    Comment: retired  Tobacco Use   Smoking status: Former    Types: Cigarettes    Quit date: 1991    Years since quitting: 31.8   Smokeless tobacco: Never  Vaping Use   Vaping Use: Never used  Substance and Sexual Activity   Alcohol use: No   Drug use: No   Sexual activity: Not on file  Other Topics Concern   Not on file  Social History Narrative   Patient lives at home with her husband Ronalee Belts)    Retired    Right handed   Some college   Caffeine three cups daily   Social Determinants of Radio broadcast assistant Strain: Not on file  Food Insecurity: Not on file  Transportation Needs: Not on file  Physical Activity: Not on file  Stress: Not on file  Social Connections: Not on file  Intimate Partner Violence: Not on file    Review of Systems: See HPI, otherwise negative ROS  Physical Exam: BP (!) 206/97   Pulse 86   Temp (!) 97.5 F (36.4 C) (Temporal)   Resp 18   Ht 5\' 3"  (1.6 m)   Wt 75 kg   SpO2 94%   BMI 29.29 kg/m  General:   Alert, cooperative in NAD Head:  Normocephalic and atraumatic. Respiratory:  Normal work of breathing. Cardiovascular:  RRR  Impression/Plan: AAIMA GADDIE is here for cataract surgery.  Risks, benefits, limitations, and alternatives regarding cataract surgery have been reviewed with the patient.  Questions have been answered.  All parties agreeable.   Benay Pillow, MD  10/16/2021, 8:52 AM

## 2021-10-16 NOTE — Anesthesia Procedure Notes (Signed)
Procedure Name: MAC Date/Time: 10/16/2021 9:05 AM Performed by: Silvana Newness, CRNA Pre-anesthesia Checklist: Patient identified, Emergency Drugs available, Suction available, Patient being monitored and Timeout performed Patient Re-evaluated:Patient Re-evaluated prior to induction Oxygen Delivery Method: Nasal cannula

## 2021-10-16 NOTE — Anesthesia Preprocedure Evaluation (Signed)
Anesthesia Evaluation  Patient identified by MRN, date of birth, ID band Patient awake    Reviewed: Allergy & Precautions, H&P , NPO status , Patient's Chart, lab work & pertinent test results  Airway Mallampati: II  TM Distance: >3 FB Neck ROM: full    Dental no notable dental hx.    Pulmonary sleep apnea , former smoker,    Pulmonary exam normal breath sounds clear to auscultation       Cardiovascular hypertension, Normal cardiovascular exam Rhythm:regular Rate:Normal     Neuro/Psych PSYCHIATRIC DISORDERS  Neuromuscular disease    GI/Hepatic GERD  ,  Endo/Other  Hypothyroidism   Renal/GU Renal disease     Musculoskeletal   Abdominal   Peds  Hematology   Anesthesia Other Findings   Reproductive/Obstetrics                             Anesthesia Physical  Anesthesia Plan  ASA: 3  Anesthesia Plan: MAC   Post-op Pain Management:    Induction:   PONV Risk Score and Plan: 2 and Treatment may vary due to age or medical condition, Midazolam and TIVA  Airway Management Planned:   Additional Equipment:   Intra-op Plan:   Post-operative Plan:   Informed Consent: I have reviewed the patients History and Physical, chart, labs and discussed the procedure including the risks, benefits and alternatives for the proposed anesthesia with the patient or authorized representative who has indicated his/her understanding and acceptance.     Dental Advisory Given  Plan Discussed with: CRNA  Anesthesia Plan Comments:         Anesthesia Quick Evaluation

## 2021-10-16 NOTE — Transfer of Care (Signed)
Immediate Anesthesia Transfer of Care Note  Patient: [name]  Procedure(s) Performed: CATARACT EXTRACTION PHACO AND INTRAOCULAR LENS PLACEMENT (IOC) LEFT (Left: Eye)  Patient Location: PACU  Anesthesia Type: MAC  Level of Consciousness: awake, alert  and patient cooperative  Airway and Oxygen Therapy: Patient Spontanous Breathing and Patient connected to supplemental oxygen  Post-op Assessment: Post-op Vital signs reviewed, Patient's Cardiovascular Status Stable, Respiratory Function Stable, Patent Airway and No signs of Nausea or vomiting  Post-op Vital Signs: Reviewed and stable  Complications: No notable events documented.

## 2021-10-16 NOTE — Op Note (Signed)
OPERATIVE NOTE  JAIMY KLIETHERMES [id number] 10/16/2021   PREOPERATIVE DIAGNOSIS:  Nuclear sclerotic cataract left eye.  H25.12   POSTOPERATIVE DIAGNOSIS:    Nuclear sclerotic cataract left eye.     PROCEDURE:  Phacoemusification with posterior chamber intraocular lens placement of the left eye   LENS:   Implant Name Type Inv. Item Serial No. Manufacturer Lot No. LRB No. Used Action  LENS IOL TECNIS EYHANCE 22.0 - [id number] Intraocular Lens LENS IOL TECNIS EYHANCE 22.0 [id number] JOHNSON   Left 1 Implanted      Procedure(s) with comments: CATARACT EXTRACTION PHACO AND INTRAOCULAR LENS PLACEMENT (IOC) LEFT (Left) - 3.66 00:27.3  DIB00 +20.0  SURGEON:  Benay Pillow, MD, MPH   ANESTHESIA:  Topical with tetracaine drops augmented with 1% preservative-free intracameral lidocaine.  ESTIMATED BLOOD LOSS: <1 mL   COMPLICATIONS:  None.   DESCRIPTION OF PROCEDURE:  The patient was identified in the holding room and transported to the operating room and placed in the supine position under the operating microscope.  The left eye was identified as the operative eye and it was prepped and draped in the usual sterile ophthalmic fashion.   A 1.0 millimeter clear-corneal paracentesis was made at the 5:00 position. 0.5 ml of preservative-free 1% lidocaine with epinephrine was injected into the anterior chamber.  The anterior chamber was filled with Healon 5 viscoelastic.  A 2.4 millimeter keratome was used to make a near-clear corneal incision at the 2:00 position.  A curvilinear capsulorrhexis was made with a cystotome and capsulorrhexis forceps.  Balanced salt solution was used to hydrodissect and hydrodelineate the nucleus.   Phacoemulsification was then used in stop and chop fashion to remove the lens nucleus and epinucleus.  The remaining cortex was then removed using the irrigation and aspiration handpiece. Healon was then placed into the capsular bag to distend it for lens placement.  A  lens was then injected into the capsular bag.  The remaining viscoelastic was aspirated.   Wounds were hydrated with balanced salt solution.  The anterior chamber was inflated to a physiologic pressure with balanced salt solution.  Intracameral vigamox 0.1 mL undiltued was injected into the eye and a drop placed onto the ocular surface.  No wound leaks were noted.  The patient was taken to the recovery room in stable condition without complications of anesthesia or surgery  Benay Pillow 10/16/2021, 9:22 AM

## 2021-10-16 NOTE — Anesthesia Postprocedure Evaluation (Signed)
Anesthesia Post Note  Patient: [name]  Procedure(s) Performed: CATARACT EXTRACTION PHACO AND INTRAOCULAR LENS PLACEMENT (IOC) LEFT (Left: Eye)     Patient location during evaluation: PACU Anesthesia Type: MAC Level of consciousness: awake and alert and oriented Pain management: satisfactory to patient Vital Signs Assessment: post-procedure vital signs reviewed and stable Respiratory status: spontaneous breathing, nonlabored ventilation and respiratory function stable Cardiovascular status: blood pressure returned to baseline and stable Postop Assessment: Adequate PO intake and No signs of nausea or vomiting Anesthetic complications: no   No notable events documented.  Raliegh Ip

## 2021-10-19 DIAGNOSIS — N183 Chronic kidney disease, stage 3 unspecified: Secondary | ICD-10-CM | POA: Diagnosis not present

## 2021-10-19 DIAGNOSIS — I129 Hypertensive chronic kidney disease with stage 1 through stage 4 chronic kidney disease, or unspecified chronic kidney disease: Secondary | ICD-10-CM | POA: Diagnosis not present

## 2021-10-19 DIAGNOSIS — M545 Low back pain, unspecified: Secondary | ICD-10-CM | POA: Diagnosis not present

## 2021-10-19 DIAGNOSIS — G4733 Obstructive sleep apnea (adult) (pediatric): Secondary | ICD-10-CM | POA: Diagnosis not present

## 2021-10-19 DIAGNOSIS — F419 Anxiety disorder, unspecified: Secondary | ICD-10-CM | POA: Diagnosis not present

## 2021-10-19 DIAGNOSIS — E039 Hypothyroidism, unspecified: Secondary | ICD-10-CM | POA: Diagnosis not present

## 2021-10-19 DIAGNOSIS — G8929 Other chronic pain: Secondary | ICD-10-CM | POA: Diagnosis not present

## 2021-10-19 DIAGNOSIS — F32A Depression, unspecified: Secondary | ICD-10-CM | POA: Diagnosis not present

## 2021-10-19 DIAGNOSIS — R7303 Prediabetes: Secondary | ICD-10-CM | POA: Diagnosis not present

## 2021-10-26 DIAGNOSIS — I129 Hypertensive chronic kidney disease with stage 1 through stage 4 chronic kidney disease, or unspecified chronic kidney disease: Secondary | ICD-10-CM | POA: Diagnosis not present

## 2021-10-26 DIAGNOSIS — F32A Depression, unspecified: Secondary | ICD-10-CM | POA: Diagnosis not present

## 2021-10-26 DIAGNOSIS — F419 Anxiety disorder, unspecified: Secondary | ICD-10-CM | POA: Diagnosis not present

## 2021-10-26 DIAGNOSIS — E039 Hypothyroidism, unspecified: Secondary | ICD-10-CM | POA: Diagnosis not present

## 2021-10-26 DIAGNOSIS — G8929 Other chronic pain: Secondary | ICD-10-CM | POA: Diagnosis not present

## 2021-10-26 DIAGNOSIS — G4733 Obstructive sleep apnea (adult) (pediatric): Secondary | ICD-10-CM | POA: Diagnosis not present

## 2021-10-26 DIAGNOSIS — M545 Low back pain, unspecified: Secondary | ICD-10-CM | POA: Diagnosis not present

## 2021-10-26 DIAGNOSIS — R7303 Prediabetes: Secondary | ICD-10-CM | POA: Diagnosis not present

## 2021-10-26 DIAGNOSIS — N183 Chronic kidney disease, stage 3 unspecified: Secondary | ICD-10-CM | POA: Diagnosis not present

## 2021-12-19 DIAGNOSIS — L309 Dermatitis, unspecified: Secondary | ICD-10-CM | POA: Diagnosis not present

## 2021-12-19 DIAGNOSIS — R270 Ataxia, unspecified: Secondary | ICD-10-CM | POA: Diagnosis not present

## 2021-12-19 DIAGNOSIS — F322 Major depressive disorder, single episode, severe without psychotic features: Secondary | ICD-10-CM | POA: Diagnosis not present

## 2021-12-19 DIAGNOSIS — M797 Fibromyalgia: Secondary | ICD-10-CM | POA: Diagnosis not present

## 2021-12-19 DIAGNOSIS — G243 Spasmodic torticollis: Secondary | ICD-10-CM | POA: Diagnosis not present

## 2021-12-27 DIAGNOSIS — F3132 Bipolar disorder, current episode depressed, moderate: Secondary | ICD-10-CM | POA: Diagnosis not present

## 2021-12-30 DIAGNOSIS — G894 Chronic pain syndrome: Secondary | ICD-10-CM | POA: Diagnosis not present

## 2021-12-30 DIAGNOSIS — M5136 Other intervertebral disc degeneration, lumbar region: Secondary | ICD-10-CM | POA: Diagnosis not present

## 2021-12-30 DIAGNOSIS — M545 Low back pain, unspecified: Secondary | ICD-10-CM | POA: Diagnosis not present

## 2022-01-03 DIAGNOSIS — K219 Gastro-esophageal reflux disease without esophagitis: Secondary | ICD-10-CM | POA: Diagnosis not present

## 2022-01-03 DIAGNOSIS — I129 Hypertensive chronic kidney disease with stage 1 through stage 4 chronic kidney disease, or unspecified chronic kidney disease: Secondary | ICD-10-CM | POA: Diagnosis not present

## 2022-01-03 DIAGNOSIS — F332 Major depressive disorder, recurrent severe without psychotic features: Secondary | ICD-10-CM | POA: Diagnosis not present

## 2022-01-03 DIAGNOSIS — G243 Spasmodic torticollis: Secondary | ICD-10-CM | POA: Diagnosis not present

## 2022-01-03 DIAGNOSIS — E039 Hypothyroidism, unspecified: Secondary | ICD-10-CM | POA: Diagnosis not present

## 2022-01-03 DIAGNOSIS — I6529 Occlusion and stenosis of unspecified carotid artery: Secondary | ICD-10-CM | POA: Diagnosis not present

## 2022-01-03 DIAGNOSIS — N183 Chronic kidney disease, stage 3 unspecified: Secondary | ICD-10-CM | POA: Diagnosis not present

## 2022-01-03 DIAGNOSIS — R7301 Impaired fasting glucose: Secondary | ICD-10-CM | POA: Diagnosis not present

## 2022-01-03 DIAGNOSIS — E782 Mixed hyperlipidemia: Secondary | ICD-10-CM | POA: Diagnosis not present

## 2022-01-03 DIAGNOSIS — Z0000 Encounter for general adult medical examination without abnormal findings: Secondary | ICD-10-CM | POA: Diagnosis not present

## 2022-01-03 DIAGNOSIS — G4733 Obstructive sleep apnea (adult) (pediatric): Secondary | ICD-10-CM | POA: Diagnosis not present

## 2022-02-06 ENCOUNTER — Other Ambulatory Visit: Payer: Self-pay | Admitting: Family Medicine

## 2022-02-06 DIAGNOSIS — Z1231 Encounter for screening mammogram for malignant neoplasm of breast: Secondary | ICD-10-CM

## 2022-02-13 DIAGNOSIS — R519 Headache, unspecified: Secondary | ICD-10-CM | POA: Diagnosis not present

## 2022-02-13 DIAGNOSIS — Z20822 Contact with and (suspected) exposure to covid-19: Secondary | ICD-10-CM | POA: Diagnosis not present

## 2022-02-13 DIAGNOSIS — G249 Dystonia, unspecified: Secondary | ICD-10-CM | POA: Diagnosis not present

## 2022-02-13 DIAGNOSIS — H53149 Visual discomfort, unspecified: Secondary | ICD-10-CM | POA: Diagnosis not present

## 2022-02-13 DIAGNOSIS — G245 Blepharospasm: Secondary | ICD-10-CM | POA: Diagnosis not present

## 2022-02-13 DIAGNOSIS — M542 Cervicalgia: Secondary | ICD-10-CM | POA: Diagnosis not present

## 2022-02-13 DIAGNOSIS — R9431 Abnormal electrocardiogram [ECG] [EKG]: Secondary | ICD-10-CM | POA: Diagnosis not present

## 2022-02-13 DIAGNOSIS — M5412 Radiculopathy, cervical region: Secondary | ICD-10-CM | POA: Diagnosis not present

## 2022-02-13 DIAGNOSIS — G243 Spasmodic torticollis: Secondary | ICD-10-CM | POA: Diagnosis not present

## 2022-02-13 DIAGNOSIS — F329 Major depressive disorder, single episode, unspecified: Secondary | ICD-10-CM | POA: Diagnosis not present

## 2022-02-13 DIAGNOSIS — F32A Depression, unspecified: Secondary | ICD-10-CM | POA: Diagnosis not present

## 2022-02-13 DIAGNOSIS — R45851 Suicidal ideations: Secondary | ICD-10-CM | POA: Diagnosis not present

## 2022-02-13 DIAGNOSIS — M545 Low back pain, unspecified: Secondary | ICD-10-CM | POA: Diagnosis not present

## 2022-02-13 DIAGNOSIS — Z79899 Other long term (current) drug therapy: Secondary | ICD-10-CM | POA: Diagnosis not present

## 2022-03-08 DIAGNOSIS — F339 Major depressive disorder, recurrent, unspecified: Secondary | ICD-10-CM | POA: Diagnosis not present

## 2022-03-27 DIAGNOSIS — Z9689 Presence of other specified functional implants: Secondary | ICD-10-CM | POA: Diagnosis not present

## 2022-03-27 DIAGNOSIS — G894 Chronic pain syndrome: Secondary | ICD-10-CM | POA: Diagnosis not present

## 2022-03-27 DIAGNOSIS — G243 Spasmodic torticollis: Secondary | ICD-10-CM | POA: Diagnosis not present

## 2022-03-27 DIAGNOSIS — F32A Depression, unspecified: Secondary | ICD-10-CM | POA: Diagnosis not present

## 2022-03-27 DIAGNOSIS — N183 Chronic kidney disease, stage 3 unspecified: Secondary | ICD-10-CM | POA: Diagnosis not present

## 2022-03-28 DIAGNOSIS — L280 Lichen simplex chronicus: Secondary | ICD-10-CM | POA: Diagnosis not present

## 2022-04-04 DIAGNOSIS — M25512 Pain in left shoulder: Secondary | ICD-10-CM | POA: Diagnosis not present

## 2022-04-04 DIAGNOSIS — M19012 Primary osteoarthritis, left shoulder: Secondary | ICD-10-CM | POA: Diagnosis not present

## 2022-04-04 DIAGNOSIS — Z96611 Presence of right artificial shoulder joint: Secondary | ICD-10-CM | POA: Diagnosis not present

## 2022-04-05 DIAGNOSIS — M542 Cervicalgia: Secondary | ICD-10-CM | POA: Diagnosis not present

## 2022-04-05 DIAGNOSIS — G243 Spasmodic torticollis: Secondary | ICD-10-CM | POA: Diagnosis not present

## 2022-04-05 DIAGNOSIS — R2681 Unsteadiness on feet: Secondary | ICD-10-CM | POA: Diagnosis not present

## 2022-04-25 DIAGNOSIS — F339 Major depressive disorder, recurrent, unspecified: Secondary | ICD-10-CM | POA: Diagnosis not present

## 2022-05-01 DIAGNOSIS — M542 Cervicalgia: Secondary | ICD-10-CM | POA: Diagnosis not present

## 2022-05-01 DIAGNOSIS — G243 Spasmodic torticollis: Secondary | ICD-10-CM | POA: Diagnosis not present

## 2022-05-01 DIAGNOSIS — M47812 Spondylosis without myelopathy or radiculopathy, cervical region: Secondary | ICD-10-CM | POA: Diagnosis not present

## 2022-05-01 DIAGNOSIS — R2681 Unsteadiness on feet: Secondary | ICD-10-CM | POA: Diagnosis not present

## 2022-05-02 DIAGNOSIS — G243 Spasmodic torticollis: Secondary | ICD-10-CM | POA: Diagnosis not present

## 2022-05-09 DIAGNOSIS — L538 Other specified erythematous conditions: Secondary | ICD-10-CM | POA: Diagnosis not present

## 2022-05-09 DIAGNOSIS — L298 Other pruritus: Secondary | ICD-10-CM | POA: Diagnosis not present

## 2022-05-09 DIAGNOSIS — Z789 Other specified health status: Secondary | ICD-10-CM | POA: Diagnosis not present

## 2022-05-09 DIAGNOSIS — L820 Inflamed seborrheic keratosis: Secondary | ICD-10-CM | POA: Diagnosis not present

## 2022-06-12 DIAGNOSIS — G4733 Obstructive sleep apnea (adult) (pediatric): Secondary | ICD-10-CM | POA: Diagnosis not present

## 2022-06-12 DIAGNOSIS — Z90710 Acquired absence of both cervix and uterus: Secondary | ICD-10-CM | POA: Diagnosis not present

## 2022-06-12 DIAGNOSIS — M47812 Spondylosis without myelopathy or radiculopathy, cervical region: Secondary | ICD-10-CM | POA: Diagnosis not present

## 2022-06-12 DIAGNOSIS — Z96651 Presence of right artificial knee joint: Secondary | ICD-10-CM | POA: Diagnosis not present

## 2022-06-12 DIAGNOSIS — K219 Gastro-esophageal reflux disease without esophagitis: Secondary | ICD-10-CM | POA: Diagnosis not present

## 2022-06-12 DIAGNOSIS — E079 Disorder of thyroid, unspecified: Secondary | ICD-10-CM | POA: Diagnosis not present

## 2022-06-12 DIAGNOSIS — Z96611 Presence of right artificial shoulder joint: Secondary | ICD-10-CM | POA: Diagnosis not present

## 2022-06-12 DIAGNOSIS — Z9049 Acquired absence of other specified parts of digestive tract: Secondary | ICD-10-CM | POA: Diagnosis not present

## 2022-06-12 DIAGNOSIS — M1990 Unspecified osteoarthritis, unspecified site: Secondary | ICD-10-CM | POA: Diagnosis not present

## 2022-06-14 DIAGNOSIS — N183 Chronic kidney disease, stage 3 unspecified: Secondary | ICD-10-CM | POA: Diagnosis not present

## 2022-06-14 DIAGNOSIS — Z79891 Long term (current) use of opiate analgesic: Secondary | ICD-10-CM | POA: Diagnosis not present

## 2022-06-14 DIAGNOSIS — Z5181 Encounter for therapeutic drug level monitoring: Secondary | ICD-10-CM | POA: Diagnosis not present

## 2022-06-14 DIAGNOSIS — M545 Low back pain, unspecified: Secondary | ICD-10-CM | POA: Diagnosis not present

## 2022-06-14 DIAGNOSIS — G894 Chronic pain syndrome: Secondary | ICD-10-CM | POA: Diagnosis not present

## 2022-06-14 DIAGNOSIS — M5136 Other intervertebral disc degeneration, lumbar region: Secondary | ICD-10-CM | POA: Diagnosis not present

## 2022-07-06 DIAGNOSIS — M25562 Pain in left knee: Secondary | ICD-10-CM | POA: Diagnosis not present

## 2022-07-20 DIAGNOSIS — U071 COVID-19: Secondary | ICD-10-CM | POA: Diagnosis not present

## 2022-08-28 DIAGNOSIS — E079 Disorder of thyroid, unspecified: Secondary | ICD-10-CM | POA: Diagnosis not present

## 2022-08-28 DIAGNOSIS — G4733 Obstructive sleep apnea (adult) (pediatric): Secondary | ICD-10-CM | POA: Diagnosis not present

## 2022-08-28 DIAGNOSIS — Z7982 Long term (current) use of aspirin: Secondary | ICD-10-CM | POA: Diagnosis not present

## 2022-08-28 DIAGNOSIS — Z79899 Other long term (current) drug therapy: Secondary | ICD-10-CM | POA: Diagnosis not present

## 2022-08-28 DIAGNOSIS — M47812 Spondylosis without myelopathy or radiculopathy, cervical region: Secondary | ICD-10-CM | POA: Diagnosis not present

## 2022-08-28 DIAGNOSIS — Z9049 Acquired absence of other specified parts of digestive tract: Secondary | ICD-10-CM | POA: Diagnosis not present

## 2022-08-28 DIAGNOSIS — K219 Gastro-esophageal reflux disease without esophagitis: Secondary | ICD-10-CM | POA: Diagnosis not present

## 2022-08-28 DIAGNOSIS — Z79890 Hormone replacement therapy (postmenopausal): Secondary | ICD-10-CM | POA: Diagnosis not present

## 2022-08-28 DIAGNOSIS — Z90710 Acquired absence of both cervix and uterus: Secondary | ICD-10-CM | POA: Diagnosis not present

## 2022-09-13 DIAGNOSIS — M545 Low back pain, unspecified: Secondary | ICD-10-CM | POA: Diagnosis not present

## 2022-09-13 DIAGNOSIS — N183 Chronic kidney disease, stage 3 unspecified: Secondary | ICD-10-CM | POA: Diagnosis not present

## 2022-09-13 DIAGNOSIS — G894 Chronic pain syndrome: Secondary | ICD-10-CM | POA: Diagnosis not present

## 2022-09-13 DIAGNOSIS — Z79891 Long term (current) use of opiate analgesic: Secondary | ICD-10-CM | POA: Diagnosis not present

## 2022-09-18 DIAGNOSIS — F339 Major depressive disorder, recurrent, unspecified: Secondary | ICD-10-CM | POA: Diagnosis not present

## 2022-09-19 DIAGNOSIS — G245 Blepharospasm: Secondary | ICD-10-CM | POA: Diagnosis not present

## 2022-09-19 DIAGNOSIS — G243 Spasmodic torticollis: Secondary | ICD-10-CM | POA: Diagnosis not present

## 2022-09-27 DIAGNOSIS — G245 Blepharospasm: Secondary | ICD-10-CM | POA: Diagnosis not present

## 2022-09-27 DIAGNOSIS — G243 Spasmodic torticollis: Secondary | ICD-10-CM | POA: Diagnosis not present

## 2022-10-08 DIAGNOSIS — S20212A Contusion of left front wall of thorax, initial encounter: Secondary | ICD-10-CM | POA: Diagnosis not present

## 2022-10-08 DIAGNOSIS — G8911 Acute pain due to trauma: Secondary | ICD-10-CM | POA: Diagnosis not present

## 2022-10-08 DIAGNOSIS — R7989 Other specified abnormal findings of blood chemistry: Secondary | ICD-10-CM | POA: Diagnosis not present

## 2022-10-08 DIAGNOSIS — R195 Other fecal abnormalities: Secondary | ICD-10-CM | POA: Diagnosis not present

## 2022-10-08 DIAGNOSIS — R1012 Left upper quadrant pain: Secondary | ICD-10-CM | POA: Diagnosis not present

## 2022-10-08 DIAGNOSIS — M542 Cervicalgia: Secondary | ICD-10-CM | POA: Diagnosis not present

## 2022-10-08 DIAGNOSIS — R519 Headache, unspecified: Secondary | ICD-10-CM | POA: Diagnosis not present

## 2022-10-08 DIAGNOSIS — M549 Dorsalgia, unspecified: Secondary | ICD-10-CM | POA: Diagnosis not present

## 2022-10-08 DIAGNOSIS — G249 Dystonia, unspecified: Secondary | ICD-10-CM | POA: Diagnosis not present

## 2022-10-08 DIAGNOSIS — R4182 Altered mental status, unspecified: Secondary | ICD-10-CM | POA: Diagnosis not present

## 2022-10-08 DIAGNOSIS — G44319 Acute post-traumatic headache, not intractable: Secondary | ICD-10-CM | POA: Diagnosis not present

## 2022-10-09 DIAGNOSIS — M47812 Spondylosis without myelopathy or radiculopathy, cervical region: Secondary | ICD-10-CM | POA: Diagnosis not present

## 2022-10-09 DIAGNOSIS — K219 Gastro-esophageal reflux disease without esophagitis: Secondary | ICD-10-CM | POA: Diagnosis not present

## 2022-10-09 DIAGNOSIS — Z9049 Acquired absence of other specified parts of digestive tract: Secondary | ICD-10-CM | POA: Diagnosis not present

## 2022-10-09 DIAGNOSIS — G4733 Obstructive sleep apnea (adult) (pediatric): Secondary | ICD-10-CM | POA: Diagnosis not present

## 2022-10-09 DIAGNOSIS — Z79899 Other long term (current) drug therapy: Secondary | ICD-10-CM | POA: Diagnosis not present

## 2022-10-09 DIAGNOSIS — Z7982 Long term (current) use of aspirin: Secondary | ICD-10-CM | POA: Diagnosis not present

## 2022-10-09 DIAGNOSIS — E079 Disorder of thyroid, unspecified: Secondary | ICD-10-CM | POA: Diagnosis not present

## 2022-10-09 DIAGNOSIS — Z79890 Hormone replacement therapy (postmenopausal): Secondary | ICD-10-CM | POA: Diagnosis not present

## 2022-10-09 DIAGNOSIS — Z90710 Acquired absence of both cervix and uterus: Secondary | ICD-10-CM | POA: Diagnosis not present

## 2022-10-16 DIAGNOSIS — K449 Diaphragmatic hernia without obstruction or gangrene: Secondary | ICD-10-CM | POA: Diagnosis not present

## 2022-10-16 DIAGNOSIS — M1990 Unspecified osteoarthritis, unspecified site: Secondary | ICD-10-CM | POA: Diagnosis not present

## 2022-10-16 DIAGNOSIS — M4802 Spinal stenosis, cervical region: Secondary | ICD-10-CM | POA: Diagnosis not present

## 2022-10-16 DIAGNOSIS — F419 Anxiety disorder, unspecified: Secondary | ICD-10-CM | POA: Diagnosis not present

## 2022-10-16 DIAGNOSIS — K219 Gastro-esophageal reflux disease without esophagitis: Secondary | ICD-10-CM | POA: Diagnosis not present

## 2022-10-16 DIAGNOSIS — F331 Major depressive disorder, recurrent, moderate: Secondary | ICD-10-CM | POA: Diagnosis not present

## 2022-10-16 DIAGNOSIS — E039 Hypothyroidism, unspecified: Secondary | ICD-10-CM | POA: Diagnosis not present

## 2022-10-16 DIAGNOSIS — G248 Other dystonia: Secondary | ICD-10-CM | POA: Diagnosis not present

## 2022-10-16 DIAGNOSIS — G4733 Obstructive sleep apnea (adult) (pediatric): Secondary | ICD-10-CM | POA: Diagnosis not present
